# Patient Record
Sex: Male | Born: 1950 | Race: Black or African American | Hispanic: No | Marital: Married | Smoking: Former smoker
Health system: Southern US, Community
[De-identification: ages and names within clinical notes are randomized; demographics above are authoritative.]

## PROBLEM LIST (undated history)

## (undated) DIAGNOSIS — Z9889 Other specified postprocedural states: Secondary | ICD-10-CM

## (undated) DIAGNOSIS — M1990 Unspecified osteoarthritis, unspecified site: Secondary | ICD-10-CM

## (undated) DIAGNOSIS — R7303 Prediabetes: Secondary | ICD-10-CM

## (undated) DIAGNOSIS — M48061 Spinal stenosis, lumbar region without neurogenic claudication: Secondary | ICD-10-CM

## (undated) DIAGNOSIS — R112 Nausea with vomiting, unspecified: Secondary | ICD-10-CM

## (undated) DIAGNOSIS — G4733 Obstructive sleep apnea (adult) (pediatric): Secondary | ICD-10-CM

## (undated) DIAGNOSIS — Z87438 Personal history of other diseases of male genital organs: Secondary | ICD-10-CM

## (undated) DIAGNOSIS — N401 Enlarged prostate with lower urinary tract symptoms: Secondary | ICD-10-CM

## (undated) DIAGNOSIS — R200 Anesthesia of skin: Secondary | ICD-10-CM

## (undated) DIAGNOSIS — K219 Gastro-esophageal reflux disease without esophagitis: Secondary | ICD-10-CM

## (undated) DIAGNOSIS — K635 Polyp of colon: Secondary | ICD-10-CM

## (undated) DIAGNOSIS — R202 Paresthesia of skin: Secondary | ICD-10-CM

## (undated) DIAGNOSIS — G4730 Sleep apnea, unspecified: Secondary | ICD-10-CM

## (undated) DIAGNOSIS — E785 Hyperlipidemia, unspecified: Secondary | ICD-10-CM

## (undated) DIAGNOSIS — Z972 Presence of dental prosthetic device (complete) (partial): Secondary | ICD-10-CM

## (undated) DIAGNOSIS — R338 Other retention of urine: Secondary | ICD-10-CM

## (undated) DIAGNOSIS — F4310 Post-traumatic stress disorder, unspecified: Secondary | ICD-10-CM

## (undated) HISTORY — PX: POLYPECTOMY: SHX149

## (undated) HISTORY — DX: Hyperlipidemia, unspecified: E78.5

## (undated) HISTORY — DX: Polyp of colon: K63.5

## (undated) HISTORY — PX: COLONOSCOPY: SHX174

## (undated) HISTORY — DX: Spinal stenosis, lumbar region without neurogenic claudication: M48.061

## (undated) HISTORY — DX: Sleep apnea, unspecified: G47.30

## (undated) HISTORY — DX: Obstructive sleep apnea (adult) (pediatric): G47.33

## (undated) HISTORY — PX: COLONOSCOPY W/ POLYPECTOMY: SHX1380

## (undated) HISTORY — PX: CYST EXCISION: SHX5701

---

## 2004-06-28 ENCOUNTER — Ambulatory Visit: Payer: Self-pay | Admitting: Internal Medicine

## 2004-09-27 ENCOUNTER — Ambulatory Visit: Payer: Self-pay | Admitting: Internal Medicine

## 2006-07-01 ENCOUNTER — Ambulatory Visit: Payer: Self-pay | Admitting: Internal Medicine

## 2006-10-02 ENCOUNTER — Ambulatory Visit: Payer: Self-pay | Admitting: Internal Medicine

## 2006-10-02 LAB — CONVERTED CEMR LAB
ALT: 37 units/L (ref 0–40)
BUN: 14 mg/dL (ref 6–23)
CO2: 29 meq/L (ref 19–32)
Creatinine, Ser: 1.2 mg/dL (ref 0.4–1.5)
Direct LDL: 153 mg/dL
Eosinophils Relative: 3.8 % (ref 0.0–5.0)
GFR calc Af Amer: 80 mL/min
Glucose, Bld: 106 mg/dL — ABNORMAL HIGH (ref 70–99)
HCT: 48 % (ref 39.0–52.0)
Monocytes Relative: 11.6 % — ABNORMAL HIGH (ref 3.0–11.0)
PSA: 0.97 ng/mL (ref 0.10–4.00)
Platelets: 173 10*3/uL (ref 150–400)
RBC: 5.54 M/uL (ref 4.22–5.81)
RDW: 12.6 % (ref 11.5–14.6)
Sodium: 137 meq/L (ref 135–145)
Total Bilirubin: 1.3 mg/dL — ABNORMAL HIGH (ref 0.3–1.2)
Total CHOL/HDL Ratio: 5.4
Triglycerides: 103 mg/dL (ref 0–149)
VLDL: 21 mg/dL (ref 0–40)
WBC: 4.6 10*3/uL (ref 4.5–10.5)

## 2006-10-09 ENCOUNTER — Ambulatory Visit: Payer: Self-pay | Admitting: Internal Medicine

## 2007-03-06 ENCOUNTER — Ambulatory Visit: Payer: Self-pay | Admitting: Gastroenterology

## 2007-03-23 ENCOUNTER — Ambulatory Visit: Payer: Self-pay | Admitting: Gastroenterology

## 2007-03-23 ENCOUNTER — Encounter: Payer: Self-pay | Admitting: Gastroenterology

## 2007-03-23 ENCOUNTER — Encounter: Payer: Self-pay | Admitting: Internal Medicine

## 2007-03-24 ENCOUNTER — Telehealth: Payer: Self-pay | Admitting: Internal Medicine

## 2007-05-14 ENCOUNTER — Ambulatory Visit: Payer: Self-pay | Admitting: Internal Medicine

## 2007-05-14 DIAGNOSIS — E785 Hyperlipidemia, unspecified: Secondary | ICD-10-CM | POA: Insufficient documentation

## 2007-05-14 LAB — CONVERTED CEMR LAB
Albumin: 4.1 g/dL (ref 3.5–5.2)
Alkaline Phosphatase: 89 units/L (ref 39–117)
Bilirubin, Direct: 0.1 mg/dL (ref 0.0–0.3)
Cholesterol: 209 mg/dL (ref 0–200)
HDL: 39.6 mg/dL (ref >39.0)
Total CHOL/HDL Ratio: 5.3
VLDL: 22 mg/dL (ref 0–40)

## 2007-05-21 ENCOUNTER — Ambulatory Visit: Payer: Self-pay | Admitting: Internal Medicine

## 2007-05-21 DIAGNOSIS — L570 Actinic keratosis: Secondary | ICD-10-CM | POA: Insufficient documentation

## 2007-05-21 DIAGNOSIS — M48061 Spinal stenosis, lumbar region without neurogenic claudication: Secondary | ICD-10-CM | POA: Insufficient documentation

## 2007-05-21 DIAGNOSIS — Z860100 Personal history of colon polyps, unspecified: Secondary | ICD-10-CM | POA: Insufficient documentation

## 2007-05-21 DIAGNOSIS — L218 Other seborrheic dermatitis: Secondary | ICD-10-CM | POA: Insufficient documentation

## 2007-05-21 LAB — CONVERTED CEMR LAB: HDL goal, serum: 40 mg/dL

## 2007-05-29 ENCOUNTER — Encounter: Admission: RE | Admit: 2007-05-29 | Discharge: 2007-05-29 | Payer: Self-pay | Admitting: Internal Medicine

## 2007-06-02 ENCOUNTER — Encounter: Payer: Self-pay | Admitting: Internal Medicine

## 2007-06-02 ENCOUNTER — Telehealth: Payer: Self-pay | Admitting: *Deleted

## 2007-09-29 ENCOUNTER — Ambulatory Visit: Payer: Self-pay | Admitting: Internal Medicine

## 2008-02-18 ENCOUNTER — Encounter: Payer: Self-pay | Admitting: Internal Medicine

## 2008-04-26 ENCOUNTER — Encounter: Payer: Self-pay | Admitting: Internal Medicine

## 2008-11-15 ENCOUNTER — Encounter: Payer: Self-pay | Admitting: Internal Medicine

## 2008-12-15 ENCOUNTER — Ambulatory Visit: Payer: Self-pay | Admitting: Internal Medicine

## 2008-12-15 DIAGNOSIS — M25559 Pain in unspecified hip: Secondary | ICD-10-CM | POA: Insufficient documentation

## 2009-03-13 ENCOUNTER — Encounter: Payer: Self-pay | Admitting: Internal Medicine

## 2009-05-09 ENCOUNTER — Encounter: Payer: Self-pay | Admitting: Internal Medicine

## 2009-05-18 ENCOUNTER — Encounter: Payer: Self-pay | Admitting: Internal Medicine

## 2009-08-26 DIAGNOSIS — G4733 Obstructive sleep apnea (adult) (pediatric): Secondary | ICD-10-CM

## 2009-08-26 HISTORY — DX: Obstructive sleep apnea (adult) (pediatric): G47.33

## 2011-01-24 ENCOUNTER — Ambulatory Visit (INDEPENDENT_AMBULATORY_CARE_PROVIDER_SITE_OTHER): Payer: BC Managed Care – PPO | Admitting: Internal Medicine

## 2011-01-24 ENCOUNTER — Encounter: Payer: Self-pay | Admitting: Internal Medicine

## 2011-01-24 VITALS — BP 130/90 | HR 76 | Temp 98.2°F | Resp 14 | Ht 69.0 in | Wt 220.0 lb

## 2011-01-24 DIAGNOSIS — L570 Actinic keratosis: Secondary | ICD-10-CM

## 2011-01-24 DIAGNOSIS — E785 Hyperlipidemia, unspecified: Secondary | ICD-10-CM

## 2011-01-24 DIAGNOSIS — B356 Tinea cruris: Secondary | ICD-10-CM | POA: Insufficient documentation

## 2011-01-24 DIAGNOSIS — M48061 Spinal stenosis, lumbar region without neurogenic claudication: Secondary | ICD-10-CM

## 2011-01-24 MED ORDER — CLOTRIMAZOLE-BETAMETHASONE 1-0.05 % EX CREA: TOPICAL_CREAM | CUTANEOUS | Status: DC

## 2011-01-24 MED ORDER — FOLIC ACID 1 MG PO TABS: 1.0000 mg | ORAL_TABLET | Freq: Every day | ORAL | Status: DC

## 2011-01-24 MED ORDER — FLUCONAZOLE 100 MG PO TABS: 100.0000 mg | ORAL_TABLET | Freq: Every day | ORAL | Status: AC

## 2011-01-24 NOTE — Assessment & Plan Note (Signed)
We will treat this with both topical and oral to the chronic nature of this problem we will use Diflucan 100 mg by mouth daily for 14 days and clotrimazole to the site locally twice daily

## 2011-01-24 NOTE — Progress Notes (Signed)
  Subjective:    Patient ID: James [facility], male    DOB: 1951/08/16, 60 y.o.   MRN: [id number]  HPI Patient is 60 year old African American male who presents with a history of separate seborrheic dermatitis and a history of actinic keratosis who has 2 lesions on his scalp that he is concerned about this time.  He also has recurrent jock itch a hyperpigmentation of the region on his thighs and scrotum and severe itching this has been going on for several months.  He is a history of spinal stenosis diagnosed by a neurologist   Review of Systems  Constitutional: Negative for fever and fatigue.  HENT: Negative for hearing loss, congestion, neck pain and postnasal drip.   Eyes: Negative for discharge, redness and visual disturbance.  Respiratory: Negative for cough, shortness of breath and wheezing.   Cardiovascular: Negative for leg swelling.  Gastrointestinal: Negative for abdominal pain, constipation and abdominal distention.  Genitourinary: Negative for urgency and frequency.  Musculoskeletal: Negative for joint swelling and arthralgias.  Skin: Negative for color change and rash.  Neurological: Negative for weakness and light-headedness.  Hematological: Negative for adenopathy.  Psychiatric/Behavioral: Negative for behavioral problems.       Past Medical History  Diagnosis Date  . Hyperlipidemia   . Colon polyps   . Spinal stenosis of lumbar region   . Sleep apnea, obstructive    Past Surgical History  Procedure Date  . Colonoscopy w/ polypectomy     reports that he has quit smoking. He does not have any smokeless tobacco history on file. He reports that he drinks alcohol. He reports that he does not use illicit drugs. family history includes Cancer in his father; Lung cancer in his father; and Ulcers in an unspecified family member. No Known Allergies  Objective:   Physical Exam  Constitutional: He appears well-developed and well-nourished.  HENT:  Head: Normocephalic  and atraumatic.  Eyes: Conjunctivae are normal. Pupils are equal, round, and reactive to light.  Neck: Normal range of motion. Neck supple.  Cardiovascular: Normal rate and regular rhythm.   Pulmonary/Chest: Effort normal and breath sounds normal.  Abdominal: Soft. Bowel sounds are normal.   Actinic keratoses on scalp 2 lesions detected Hyperpigmented lesion in the groin involving the thighs bilaterally and the scrotum       Assessment & Plan:  Severe tenia crus and will treat with Diflucan orally and clotrimazole topically Actinic keratoses 2.3 cm lesions were detected on the scalp with ulcerated bases  Informed consent was obtained and each lesion was treated for 60 seconds of liquid nitrogen application the patient tolerated the procedure well as procedural care was discussed with the patient and instructions should the lesion reappears contact our office immediately Discussed health maintenance issues he was scheduled for physical he is due a tetanus she defers to his

## 2011-03-19 ENCOUNTER — Other Ambulatory Visit (INDEPENDENT_AMBULATORY_CARE_PROVIDER_SITE_OTHER): Payer: BC Managed Care – PPO

## 2011-03-19 DIAGNOSIS — Z0000 Encounter for general adult medical examination without abnormal findings: Secondary | ICD-10-CM

## 2011-03-19 LAB — CBC WITH DIFFERENTIAL/PLATELET
Basophils Absolute: 0 10*3/uL (ref 0.0–0.1)
Eosinophils Absolute: 0.3 10*3/uL (ref 0.0–0.7)
Eosinophils Relative: 6.2 % — ABNORMAL HIGH (ref 0.0–5.0)
HCT: 49.6 % (ref 39.0–52.0)
Hemoglobin: 16.5 g/dL (ref 13.0–17.0)
Monocytes Absolute: 0.6 10*3/uL (ref 0.1–1.0)
Monocytes Relative: 10.7 % (ref 3.0–12.0)
Neutrophils Relative %: 47.6 % (ref 43.0–77.0)
RBC: 5.66 Mil/uL (ref 4.22–5.81)
WBC: 5.3 10*3/uL (ref 4.5–10.5)

## 2011-03-19 LAB — POCT URINALYSIS DIPSTICK
Bilirubin, UA: NEGATIVE
pH, UA: 6

## 2011-03-19 LAB — HEPATIC FUNCTION PANEL
Albumin: 4.4 g/dL (ref 3.5–5.2)
Alkaline Phosphatase: 75 U/L (ref 39–117)
Total Bilirubin: 1.1 mg/dL (ref 0.3–1.2)

## 2011-03-19 LAB — LIPID PANEL
Cholesterol: 235 mg/dL — ABNORMAL HIGH (ref 0–200)
Total CHOL/HDL Ratio: 5
Triglycerides: 122 mg/dL (ref 0.0–149.0)
VLDL: 24.4 mg/dL (ref 0.0–40.0)

## 2011-03-19 LAB — BASIC METABOLIC PANEL
BUN: 18 mg/dL (ref 6–23)
Creatinine, Ser: 1.3 mg/dL (ref 0.4–1.5)
GFR: 70.81 mL/min (ref >60.00)
Glucose, Bld: 104 mg/dL — ABNORMAL HIGH (ref 70–99)

## 2011-03-19 LAB — LDL CHOLESTEROL, DIRECT: Direct LDL: 155.6 mg/dL

## 2011-03-27 ENCOUNTER — Ambulatory Visit (INDEPENDENT_AMBULATORY_CARE_PROVIDER_SITE_OTHER): Payer: BC Managed Care – PPO | Admitting: Internal Medicine

## 2011-03-27 ENCOUNTER — Encounter: Payer: Self-pay | Admitting: Internal Medicine

## 2011-03-27 VITALS — BP 130/84 | HR 84 | Temp 98.2°F | Resp 16 | Ht 69.0 in | Wt 226.0 lb

## 2011-03-27 DIAGNOSIS — M169 Osteoarthritis of hip, unspecified: Secondary | ICD-10-CM

## 2011-03-27 DIAGNOSIS — M25559 Pain in unspecified hip: Secondary | ICD-10-CM

## 2011-03-27 DIAGNOSIS — E785 Hyperlipidemia, unspecified: Secondary | ICD-10-CM

## 2011-03-27 DIAGNOSIS — Z0000 Encounter for general adult medical examination without abnormal findings: Secondary | ICD-10-CM

## 2011-03-27 MED ORDER — KRILL OIL 1000 MG PO CAPS: 1.0000 | ORAL_CAPSULE | Freq: Two times a day (BID) | ORAL | Status: DC

## 2011-03-27 MED ORDER — METHYLPREDNISOLONE ACETATE 40 MG/ML IJ SUSP: 40.0000 mg | Freq: Once | INTRAMUSCULAR | Status: DC

## 2011-03-27 NOTE — Progress Notes (Signed)
Subjective:    Patient ID: James Gates, male    DOB: February 14, 1951, 60 y.o.   MRN: [id number]  HPI Patient is a 60 year old [place] American male who presents for followup and a complete physical examination today.  He has elevated cholesterol is intolerant of statins had been on Zetia but did not like the way the Zetia may go as well so we started him on a low dose of Vicryl oil which he has tolerated we also talked about weight loss and exercise which is not achieved today he presents for screening physical labs with evidence of hyperlipidemia.  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  Otherwise he is doing well his past history is significant for spinal stenosis hyperlipidemia seborrheic dermatitis. Per   Review of Systems  Constitutional: Negative for fever and fatigue.  HENT: Negative for hearing loss, congestion, neck pain and postnasal drip.   Eyes: Negative for discharge, redness and visual disturbance.  Respiratory: Negative for cough, shortness of breath and wheezing.   Cardiovascular: Negative for leg swelling.  Gastrointestinal: Negative for abdominal pain, constipation and abdominal distention.  Genitourinary: Negative for urgency and frequency.  Musculoskeletal: Negative for joint swelling and arthralgias.  Skin: Negative for color change and rash.  Neurological: Negative for weakness and light-headedness.  Hematological: Negative for adenopathy.  Psychiatric/Behavioral: Negative for behavioral problems.   Past Medical History  Diagnosis Date  . Hyperlipidemia   . Colon polyps   . Spinal stenosis of lumbar region   . Sleep apnea, obstructive    Past Surgical History  Procedure Date  . Colonoscopy w/ polypectomy     reports that he has quit smoking. He does not have any smokeless  tobacco history on file. He reports that he drinks alcohol. He reports that he does not use illicit drugs. family history includes Cancer in his father; Lung cancer in his father; and Ulcers in an unspecified family member. No Known Allergies     Objective:   Physical Exam  Vitals reviewed. Constitutional: He appears well-developed and well-nourished.  HENT:  Head: Normocephalic and atraumatic.  Eyes: Conjunctivae are normal. Pupils are equal, round, and reactive to light.  Neck: Normal range of motion. Neck supple.  Cardiovascular: Normal rate and regular rhythm.   Pulmonary/Chest: Effort normal and breath sounds normal.  Abdominal: Soft. Bowel sounds are normal.  Genitourinary:       Moderately enlarged prostate  Musculoskeletal: He exhibits tenderness.  Neurological: He is alert.  Skin: Skin is warm and dry.          Assessment & Plan:   Patient presents for yearly preventative medicine examination.   all immunizations and health maintenance protocols were reviewed with the patient and they are up to date with these protocols.   screening laboratory values were reviewed with the patient including screening of hyperlipidemia PSA renal function and hepatic function.   There medications past medical history social history problem list and allergies were reviewed in detail.   Goals were established with regard to weight loss exercise diet in compliance with medications Patient is into joint disease of the hip and will receive a hip injection today he gave informed consent to site was prepped with Betadine a injection of 40 mg of Depo-Medrol and 1 cc of lidocaine 1% was injected into  the left hip patient tolerated the procedure well   His cholesterol is not at goal we discussed increasing the KRILL OIL TO twice a day; 10 pounds weight loss and we'll recheck his cholesterol in 3 months

## 2011-09-10 ENCOUNTER — Other Ambulatory Visit: Payer: BC Managed Care – PPO

## 2011-09-18 ENCOUNTER — Ambulatory Visit: Payer: BC Managed Care – PPO | Admitting: Internal Medicine

## 2012-04-06 ENCOUNTER — Encounter: Payer: Self-pay | Admitting: *Deleted

## 2012-04-08 ENCOUNTER — Encounter: Payer: Self-pay | Admitting: Gastroenterology

## 2012-05-19 ENCOUNTER — Other Ambulatory Visit (INDEPENDENT_AMBULATORY_CARE_PROVIDER_SITE_OTHER): Payer: BC Managed Care – PPO

## 2012-05-19 DIAGNOSIS — E785 Hyperlipidemia, unspecified: Secondary | ICD-10-CM

## 2012-05-19 DIAGNOSIS — Z0000 Encounter for general adult medical examination without abnormal findings: Secondary | ICD-10-CM

## 2012-05-19 LAB — CBC WITH DIFFERENTIAL/PLATELET
Basophils Relative: 0.9 % (ref 0.0–3.0)
Eosinophils Absolute: 0.2 10*3/uL (ref 0.0–0.7)
Eosinophils Relative: 5.4 % — ABNORMAL HIGH (ref 0.0–5.0)
HCT: 49.4 % (ref 39.0–52.0)
Lymphocytes Relative: 29.9 % (ref 12.0–46.0)
Lymphs Abs: 1.3 10*3/uL (ref 0.7–4.0)
Monocytes Absolute: 0.5 10*3/uL (ref 0.1–1.0)
RBC: 5.63 Mil/uL (ref 4.22–5.81)

## 2012-05-19 LAB — POCT URINALYSIS DIPSTICK
Blood, UA: NEGATIVE
Ketones, UA: NEGATIVE
Leukocytes, UA: NEGATIVE
Nitrite, UA: NEGATIVE
Urobilinogen, UA: 0.2
pH, UA: 5.5

## 2012-05-19 LAB — LIPID PANEL
Triglycerides: 92 mg/dL (ref 0.0–149.0)
VLDL: 18.4 mg/dL (ref 0.0–40.0)

## 2012-05-19 LAB — BASIC METABOLIC PANEL
CO2: 27 mEq/L (ref 19–32)
Calcium: 9.6 mg/dL (ref 8.4–10.5)
Chloride: 100 mEq/L (ref 96–112)
Creatinine, Ser: 1.3 mg/dL (ref 0.4–1.5)
GFR: 70.54 mL/min (ref >60.00)
Glucose, Bld: 100 mg/dL — ABNORMAL HIGH (ref 70–99)

## 2012-05-19 LAB — MICROALBUMIN / CREATININE URINE RATIO
Creatinine,U: 165.8 mg/dL
Microalb, Ur: 0.8 mg/dL (ref 0.0–1.9)

## 2012-05-19 LAB — HEPATIC FUNCTION PANEL
Albumin: 4.3 g/dL (ref 3.5–5.2)
Alkaline Phosphatase: 101 U/L (ref 39–117)

## 2012-05-20 LAB — LDL CHOLESTEROL, DIRECT: Direct LDL: 155.3 mg/dL

## 2012-05-26 ENCOUNTER — Encounter: Payer: Self-pay | Admitting: Internal Medicine

## 2012-05-26 ENCOUNTER — Ambulatory Visit (INDEPENDENT_AMBULATORY_CARE_PROVIDER_SITE_OTHER): Payer: BC Managed Care – PPO | Admitting: Internal Medicine

## 2012-05-26 VITALS — BP 132/84 | HR 72 | Temp 98.3°F | Ht 69.0 in | Wt 213.0 lb

## 2012-05-26 DIAGNOSIS — Z0000 Encounter for general adult medical examination without abnormal findings: Secondary | ICD-10-CM

## 2012-05-26 DIAGNOSIS — Z23 Encounter for immunization: Secondary | ICD-10-CM

## 2012-05-26 DIAGNOSIS — E785 Hyperlipidemia, unspecified: Secondary | ICD-10-CM

## 2012-05-26 MED ORDER — RED YEAST RICE 600 MG PO TABS: 1.0000 | ORAL_TABLET | Freq: Two times a day (BID) | ORAL | Status: DC

## 2012-05-26 MED ORDER — FOLIC ACID 1 MG PO TABS: 1.0000 mg | ORAL_TABLET | Freq: Every day | ORAL | Status: DC

## 2012-05-26 NOTE — Progress Notes (Signed)
Subjective:    Patient ID: James [facility], male    DOB: 08/02/1951, 61 y.o.   MRN: [id number]  HPI  CPX Pt had increased Hip pain and was evaluated by orthopedics Has been focusing on weight loss and exercise. Needs TD   Review of Systems  Constitutional: Negative for fever and fatigue.  HENT: Negative for hearing loss, congestion, neck pain and postnasal drip.   Eyes: Negative for discharge, redness and visual disturbance.  Respiratory: Negative for cough, shortness of breath and wheezing.   Cardiovascular: Negative for leg swelling.  Gastrointestinal: Negative for abdominal pain, constipation and abdominal distention.  Genitourinary: Negative for urgency and frequency.  Musculoskeletal: Positive for myalgias, back pain, joint swelling and gait problem. Negative for arthralgias.  Skin: Negative for color change and rash.  Neurological: Negative for weakness and light-headedness.  Hematological: Negative for adenopathy.  Psychiatric/Behavioral: Negative for behavioral problems.   Past Medical History  Diagnosis Date  . Hyperlipidemia   . Colon polyps   . Spinal stenosis of lumbar region   . Sleep apnea, obstructive     History   Social History  . Marital Status: Married    Spouse Name: N/A    Number of Children: N/A  . Years of Education: N/A   Occupational History  . Not on file.   Social History Main Topics  . Smoking status: Former Games developer  . Smokeless tobacco: Not on file  . Alcohol Use: Yes  . Drug Use: No  . Sexually Active: Yes   Other Topics Concern  . Not on file   Social History Narrative  . No narrative on file    Past Surgical History  Procedure Date  . Colonoscopy w/ polypectomy     Family History  Problem Relation Age of Onset  . Ulcers    . Lung cancer Father     No Known Allergies  Current Outpatient Prescriptions on File Prior to Visit  Medication Sig Dispense Refill  . aspirin 81 MG tablet Take 81 mg by mouth daily.        Marland Kitchen  KRILL OIL 1000 MG CAPS Take 1 capsule (1,000 mg total) by mouth 2 (two) times daily.      Marland Kitchen DISCONTD: folic acid (FOLVITE) 1 MG tablet Take 1 tablet (1 mg total) by mouth daily.  30 tablet  3   Current Facility-Administered Medications on File Prior to Visit  Medication Dose Route Frequency Provider Last Rate Last Dose  . methylPREDNISolone acetate (DEPO-MEDROL) injection 40 mg  40 mg Intra-articular Once Stacie Glaze, MD        BP 132/84  Pulse 72  Temp 98.3 F (36.8 C) (Oral)  Ht 5\' 9"  (1.753 m)  Wt 213 lb (96.616 kg)  BMI 31.45 kg/m2       Objective:   Physical Exam  Nursing note and vitals reviewed. Constitutional: He is oriented to person, place, and time. He appears well-developed and well-nourished.  HENT:  Head: Normocephalic and atraumatic.  Eyes: Conjunctivae normal are normal. Pupils are equal, round, and reactive to light.  Neck: Normal range of motion. Neck supple.  Cardiovascular: Normal rate and regular rhythm.   Pulmonary/Chest: Effort normal and breath sounds normal.  Abdominal: Soft. Bowel sounds are normal.  Genitourinary: Rectum normal and prostate normal.  Musculoskeletal:       Right hip pain and limited ROM  Neurological: He is alert and oriented to person, place, and time.  Skin: Skin is warm and dry.  Psychiatric:  He has a normal mood and affect. His behavior is normal.          Assessment & Plan:  Refer to [facility] for consideration of partial hip replacement  Patient presents for yearly preventative medicine examination.   all immunizations and health maintenance protocols were reviewed with the patient and they are up to date with these protocols.   screening laboratory values were reviewed with the patient including screening of hyperlipidemia PSA renal function and hepatic function.   There medications past medical history social history problem list and allergies were reviewed in detail.   Goals were established with regard to weight  loss exercise diet in compliance with medications

## 2012-05-26 NOTE — Patient Instructions (Addendum)
Consider a gluten-free diet such as the "partical paleo" Add red rice yeast twice a day   We can refer you to [facility] to discuss partial hip replacement

## 2012-11-09 ENCOUNTER — Other Ambulatory Visit: Payer: BC Managed Care – PPO

## 2012-11-16 ENCOUNTER — Ambulatory Visit: Payer: BC Managed Care – PPO | Admitting: Internal Medicine

## 2012-12-03 ENCOUNTER — Encounter: Payer: Self-pay | Admitting: Gastroenterology

## 2013-10-05 ENCOUNTER — Other Ambulatory Visit: Payer: BC Managed Care – PPO

## 2013-10-11 ENCOUNTER — Other Ambulatory Visit (INDEPENDENT_AMBULATORY_CARE_PROVIDER_SITE_OTHER): Payer: BC Managed Care – PPO

## 2013-10-11 ENCOUNTER — Other Ambulatory Visit: Payer: BC Managed Care – PPO

## 2013-10-11 DIAGNOSIS — E785 Hyperlipidemia, unspecified: Secondary | ICD-10-CM

## 2013-10-11 DIAGNOSIS — Z0000 Encounter for general adult medical examination without abnormal findings: Secondary | ICD-10-CM

## 2013-10-11 LAB — HEPATIC FUNCTION PANEL
ALT: 32 U/L (ref 0–53)
AST: 23 U/L (ref 0–37)
Albumin: 4.5 g/dL (ref 3.5–5.2)
Alkaline Phosphatase: 85 U/L (ref 39–117)
BILIRUBIN DIRECT: 0.1 mg/dL (ref 0.0–0.3)
TOTAL PROTEIN: 7.3 g/dL (ref 6.0–8.3)
Total Bilirubin: 0.8 mg/dL (ref 0.3–1.2)

## 2013-10-11 LAB — BASIC METABOLIC PANEL
BUN: 16 mg/dL (ref 6–23)
CALCIUM: 9.1 mg/dL (ref 8.4–10.5)
CHLORIDE: 104 meq/L (ref 96–112)
CO2: 27 meq/L (ref 19–32)
CREATININE: 1.2 mg/dL (ref 0.4–1.5)
GFR: 76.9 mL/min (ref >60.00)
Glucose, Bld: 92 mg/dL (ref 70–99)
POTASSIUM: 4.7 meq/L (ref 3.5–5.1)
SODIUM: 139 meq/L (ref 135–145)

## 2013-10-11 LAB — POCT URINALYSIS DIPSTICK
Bilirubin, UA: NEGATIVE
GLUCOSE UA: NEGATIVE
NITRITE UA: NEGATIVE
Protein, UA: NEGATIVE
Spec Grav, UA: 1.015
Urobilinogen, UA: 0.2
pH, UA: 5.5

## 2013-10-11 LAB — CBC WITH DIFFERENTIAL/PLATELET
BASOS PCT: 0.6 % (ref 0.0–3.0)
Basophils Absolute: 0 10*3/uL (ref 0.0–0.1)
EOS ABS: 0.4 10*3/uL (ref 0.0–0.7)
Eosinophils Relative: 7.5 % — ABNORMAL HIGH (ref 0.0–5.0)
HCT: 49.8 % (ref 39.0–52.0)
Hemoglobin: 16 g/dL (ref 13.0–17.0)
LYMPHS ABS: 2.1 10*3/uL (ref 0.7–4.0)
LYMPHS PCT: 40.1 % (ref 12.0–46.0)
MCHC: 32.1 g/dL (ref 30.0–36.0)
MCV: 89.3 fl (ref 78.0–100.0)
MONO ABS: 0.4 10*3/uL (ref 0.1–1.0)
MONOS PCT: 8.4 % (ref 3.0–12.0)
Neutro Abs: 2.3 10*3/uL (ref 1.4–7.7)
Neutrophils Relative %: 43.4 % (ref 43.0–77.0)
PLATELETS: 150 10*3/uL (ref 150.0–400.0)
RBC: 5.58 Mil/uL (ref 4.22–5.81)
RDW: 13.7 % (ref 11.5–14.6)
WBC: 5.3 10*3/uL (ref 4.5–10.5)

## 2013-10-11 LAB — LDL CHOLESTEROL, DIRECT: Direct LDL: 157.6 mg/dL

## 2013-10-11 LAB — LIPID PANEL
Cholesterol: 221 mg/dL — ABNORMAL HIGH (ref 0–200)
HDL: 55.1 mg/dL (ref >39.00)
TRIGLYCERIDES: 59 mg/dL (ref 0.0–149.0)
Total CHOL/HDL Ratio: 4
VLDL: 11.8 mg/dL (ref 0.0–40.0)

## 2013-10-11 LAB — TSH: TSH: 0.37 u[IU]/mL (ref 0.35–5.50)

## 2013-10-11 LAB — PSA: PSA: 1.15 ng/mL (ref 0.10–4.00)

## 2013-10-12 ENCOUNTER — Other Ambulatory Visit: Payer: BC Managed Care – PPO

## 2013-10-18 ENCOUNTER — Encounter: Payer: Self-pay | Admitting: Internal Medicine

## 2013-10-18 ENCOUNTER — Ambulatory Visit (INDEPENDENT_AMBULATORY_CARE_PROVIDER_SITE_OTHER): Payer: BC Managed Care – PPO | Admitting: Internal Medicine

## 2013-10-18 VITALS — BP 130/84 | HR 76 | Temp 98.1°F | Resp 16 | Ht 69.0 in | Wt 216.0 lb

## 2013-10-18 DIAGNOSIS — L723 Sebaceous cyst: Secondary | ICD-10-CM

## 2013-10-18 DIAGNOSIS — L729 Follicular cyst of the skin and subcutaneous tissue, unspecified: Secondary | ICD-10-CM

## 2013-10-18 DIAGNOSIS — Z0000 Encounter for general adult medical examination without abnormal findings: Secondary | ICD-10-CM

## 2013-10-18 MED ORDER — MUPIROCIN CALCIUM 2 % EX CREA: 1.0000 "application " | TOPICAL_CREAM | Freq: Two times a day (BID) | CUTANEOUS | Status: DC

## 2013-10-18 NOTE — Addendum Note (Signed)
Addended by: Ricard Dillon on: 10/18/2013 04:30 PM   Modules accepted: Orders

## 2013-10-18 NOTE — Patient Instructions (Signed)
The patient is instructed to continue all medications as prescribed. Schedule followup with check out clerk upon leaving the clinic  

## 2013-10-18 NOTE — Progress Notes (Signed)
Subjective:    Patient ID: James Gates, male    DOB: [birth year], 63 y.o.   MRN: [id number]  HPI Patient is a 63 year old male who presents for his annual examination.  He has a chief complaint of pain in his right hip he states that this is been chronic it is alleviated with one or 2 Advil several times a week it is not debilitating and does not interfere with activities at this point.  He also has a spot on his scalp that he has scratched and caused hyperpigmentation due to excoriations chronically   Review of Systems  Constitutional: Negative for fever and fatigue.  HENT: Negative for congestion, hearing loss and postnasal drip.   Eyes: Negative for discharge, redness and visual disturbance.  Respiratory: Negative for cough, shortness of breath and wheezing.   Cardiovascular: Negative for leg swelling.  Gastrointestinal: Negative for abdominal pain, constipation and abdominal distention.  Genitourinary: Negative for urgency and frequency.  Musculoskeletal: Positive for gait problem, joint swelling and myalgias. Negative for arthralgias and neck pain.  Skin: Positive for color change and rash.  Neurological: Negative for weakness and light-headedness.  Hematological: Negative for adenopathy.  Psychiatric/Behavioral: Negative for behavioral problems.       Past Medical History  Diagnosis Date  . Hyperlipidemia   . Colon polyps   . Spinal stenosis of lumbar region   . Sleep apnea, obstructive     History   Social History  . Marital Status: Married    Spouse Name: N/A    Number of Children: N/A  . Years of Education: N/A   Occupational History  . Not on file.   Social History Main Topics  . Smoking status: Former Research scientist (life sciences)  . Smokeless tobacco: Not on file  . Alcohol Use: Yes  . Drug Use: No  . Sexual Activity: Yes   Other Topics Concern  . Not on file   Social History Narrative  . No narrative on file    Past Surgical History  Procedure Laterality Date    . Colonoscopy w/ polypectomy      Family History  Problem Relation Age of Onset  . Ulcers    . Lung cancer Father     No Known Allergies  Current Outpatient Prescriptions on File Prior to Visit  Medication Sig Dispense Refill  . aspirin 81 MG tablet Take 81 mg by mouth daily.        . Red Yeast Rice 600 MG TABS Take 1 tablet (600 mg total) by mouth 2 (two) times daily.       Current Facility-Administered Medications on File Prior to Visit  Medication Dose Route Frequency Provider Last Rate Last Dose  . methylPREDNISolone acetate (DEPO-MEDROL) injection 40 mg  40 mg Intra-articular Once Ricard Dillon, MD        BP 130/84  Pulse 76  Temp(Src) 98.1 F (36.7 C)  Resp 16  Ht 5\' 9"  (1.753 m)  Wt 216 lb (97.977 kg)  BMI 31.88 kg/m2    Objective:   Physical Exam  Constitutional: He is oriented to person, place, and time. He appears well-developed and well-nourished.  HENT:  Head: Normocephalic and atraumatic.  Eyes: Conjunctivae are normal. Pupils are equal, round, and reactive to light.  Neck: Normal range of motion. Neck supple.  Cardiovascular: Normal rate and regular rhythm.   Murmur heard. Pulmonary/Chest: Effort normal and breath sounds normal.  Abdominal: Soft. Bowel sounds are normal.  Musculoskeletal: Normal range of motion. He exhibits tenderness.  Neurological: He is alert and oriented to person, place, and time.  Skin: Skin is dry. Rash noted.          Assessment & Plan:   Patient presents for yearly preventative medicine examination.   all immunizations and health maintenance protocols were reviewed with the patient and they are up to date with these protocols.   screening laboratory values were reviewed with the patient including screening of hyperlipidemia PSA renal function and hepatic function.   There medications past medical history social history problem list and allergies were reviewed in detail.   Goals were established with regard to  weight loss exercise diet in compliance with medications   Informed consent was obtained in the lesion was treated for 60 seconds of liquid nitrogen application the patient tolerated the procedure well as procedural care was discussed with the patient and instructions should the lesion reappears contact our office immediately

## 2014-08-05 ENCOUNTER — Telehealth: Payer: Self-pay | Admitting: *Deleted

## 2014-08-05 NOTE — Telephone Encounter (Signed)
Sent referral to [facility] to manage patient's CPAP machine and supplies. Wife notified referral done. Once patient's insurance benefits has been verified,  [facility] should be contacting him for service. Wife voiced her understanding of this and will notify the patient.

## 2014-10-17 ENCOUNTER — Other Ambulatory Visit: Payer: BC Managed Care – PPO

## 2014-10-26 ENCOUNTER — Encounter: Payer: BC Managed Care – PPO | Admitting: Internal Medicine

## 2014-11-03 ENCOUNTER — Telehealth: Payer: Self-pay | Admitting: Internal Medicine

## 2014-11-03 NOTE — Telephone Encounter (Signed)
Pt would like to transfer to you from Dr Arnoldo Morale.  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  Is this ok to schedule?

## 2014-11-03 NOTE — Telephone Encounter (Signed)
Ok with me 

## 2014-12-14 DIAGNOSIS — G4733 Obstructive sleep apnea (adult) (pediatric): Secondary | ICD-10-CM | POA: Insufficient documentation

## 2014-12-14 DIAGNOSIS — N400 Enlarged prostate without lower urinary tract symptoms: Secondary | ICD-10-CM | POA: Insufficient documentation

## 2015-01-05 ENCOUNTER — Ambulatory Visit: Payer: BC Managed Care – PPO | Admitting: Internal Medicine

## 2015-03-16 ENCOUNTER — Other Ambulatory Visit: Payer: Self-pay | Admitting: Orthopedic Surgery

## 2015-04-12 NOTE — Pre-Procedure Instructions (Signed)
James Gates  04/12/2015      [id number] - [place], [place] - [address] [place] [address] Phone: [phone] Fax: [phone]    Your procedure is scheduled on Mon, Aug 29 @ 10:10 AM  Report to [facility] Admitting at 8:00 AM  Call this number if you have problems the morning of surgery:  [phone]   Remember:  Do not eat food or drink liquids after midnight.                Stop taking your Naproxen and Aspirin. No Goody's,BC's,Ibuprofen,Fish Oil,or any Herbal Medications.f   Do not wear jewelry.  Do not wear lotions, powders, or perfumes.  You may wear deodorant.             Men may shave face and neck.  Do not bring valuables to the hospital.  [facility] is not responsible for any belongings or valuables.  Contacts, dentures or bridgework may not be worn into surgery.  Leave your suitcase in the car.  After surgery it may be brought to your room.  For patients admitted to the hospital, discharge time will be determined by your treatment team.  Patients discharged the day of surgery will not be allowed to drive home.   Special instructions:  [facility] - Preparing for Surgery  Before surgery, you can play an important role.  Because skin is not sterile, your skin needs to be as free of germs as possible.  You can reduce the number of germs on you skin by washing with CHG (chlorahexidine gluconate) soap before surgery.  CHG is an antiseptic cleaner which kills germs and bonds with the skin to continue killing germs even after washing.  Please DO NOT use if you have an allergy to CHG or antibacterial soaps.  If your skin becomes reddened/irritated stop using the CHG and inform your nurse when you arrive at Short Stay.  Do not shave (including legs and underarms) for at least 48 hours prior to the first CHG shower.  You may shave your face.  Please follow these instructions carefully:   1.  Shower with CHG Soap the  night before surgery and the                                morning of Surgery.  2.  If you choose to wash your hair, wash your hair first as usual with your       normal shampoo.  3.  After you shampoo, rinse your hair and body thoroughly to remove the                      Shampoo.  4.  Use CHG as you would any other liquid soap.  You can apply chg directly       to the skin and wash gently with scrungie or a clean washcloth.  5.  Apply the CHG Soap to your body ONLY FROM THE NECK DOWN.        Do not use on open wounds or open sores.  Avoid contact with your eyes,       ears, mouth and genitals (private parts).  Wash genitals (private parts)       with your normal soap.  6.  Wash thoroughly, paying special attention to the area where your surgery  will be performed.  7.  Thoroughly rinse your body with warm water from the neck down.  8.  DO NOT shower/wash with your normal soap after using and rinsing off       the CHG Soap.  9.  Pat yourself dry with a clean towel.            10.  Wear clean pajamas.            11.  Place clean sheets on your bed the night of your first shower and do not        sleep with pets.  Day of Surgery  Do not apply any lotions/deoderants the morning of surgery.  Please wear clean clothes to the hospital/surgery [facility].    Please read over the following fact sheets that you were given. Pain Booklet, Coughing and Deep Breathing, Blood Transfusion Information, MRSA Information and Surgical Site Infection Prevention

## 2015-04-13 ENCOUNTER — Encounter (HOSPITAL_COMMUNITY): Payer: Self-pay

## 2015-04-13 ENCOUNTER — Encounter (HOSPITAL_COMMUNITY)
Admission: RE | Admit: 2015-04-13 | Discharge: 2015-04-13 | Disposition: A | Discharge status: Home / Self Care | Payer: Medicare Other | Source: Ambulatory Visit | Attending: Orthopedic Surgery | Admitting: Orthopedic Surgery

## 2015-04-13 ENCOUNTER — Ambulatory Visit (HOSPITAL_COMMUNITY)
Admission: RE | Admit: 2015-04-13 | Discharge: 2015-04-13 | Disposition: A | Discharge status: Home / Self Care | Payer: Medicare Other | Source: Ambulatory Visit | Attending: Orthopedic Surgery | Admitting: Orthopedic Surgery

## 2015-04-13 DIAGNOSIS — M1611 Unilateral primary osteoarthritis, right hip: Secondary | ICD-10-CM | POA: Insufficient documentation

## 2015-04-13 DIAGNOSIS — Z01818 Encounter for other preprocedural examination: Secondary | ICD-10-CM | POA: Insufficient documentation

## 2015-04-13 DIAGNOSIS — Z01812 Encounter for preprocedural laboratory examination: Secondary | ICD-10-CM | POA: Diagnosis not present

## 2015-04-13 DIAGNOSIS — Z01810 Encounter for preprocedural cardiovascular examination: Secondary | ICD-10-CM | POA: Diagnosis not present

## 2015-04-13 DIAGNOSIS — Z0183 Encounter for blood typing: Secondary | ICD-10-CM | POA: Diagnosis not present

## 2015-04-13 DIAGNOSIS — F17210 Nicotine dependence, cigarettes, uncomplicated: Secondary | ICD-10-CM | POA: Diagnosis not present

## 2015-04-13 HISTORY — DX: Other retention of urine: R33.8

## 2015-04-13 HISTORY — DX: Personal history of other diseases of male genital organs: Z87.438

## 2015-04-13 HISTORY — DX: Benign prostatic hyperplasia with lower urinary tract symptoms: N40.1

## 2015-04-13 HISTORY — DX: Gastro-esophageal reflux disease without esophagitis: K21.9

## 2015-04-13 HISTORY — DX: Presence of dental prosthetic device (complete) (partial): Z97.2

## 2015-04-13 HISTORY — DX: Unspecified osteoarthritis, unspecified site: M19.90

## 2015-04-13 LAB — URINALYSIS, ROUTINE W REFLEX MICROSCOPIC
BILIRUBIN URINE: NEGATIVE
Glucose, UA: NEGATIVE mg/dL
Hgb urine dipstick: NEGATIVE
Ketones, ur: NEGATIVE mg/dL
Leukocytes, UA: NEGATIVE
NITRITE: NEGATIVE
PH: 6 (ref 5.0–8.0)
Protein, ur: NEGATIVE mg/dL
SPECIFIC GRAVITY, URINE: 1.015 (ref 1.005–1.030)
UROBILINOGEN UA: 0.2 mg/dL (ref 0.0–1.0)

## 2015-04-13 LAB — BASIC METABOLIC PANEL
Anion gap: 7 (ref 5–15)
BUN: 13 mg/dL (ref 6–20)
CO2: 26 mmol/L (ref 22–32)
CREATININE: 1.23 mg/dL (ref 0.61–1.24)
Calcium: 9.8 mg/dL (ref 8.9–10.3)
Chloride: 105 mmol/L (ref 101–111)
GFR calc Af Amer: >60 mL/min (ref >60)
GFR, EST NON AFRICAN AMERICAN: 60 mL/min — AB (ref >60)
GLUCOSE: 109 mg/dL — AB (ref 65–99)
Potassium: 4.5 mmol/L (ref 3.5–5.1)
SODIUM: 138 mmol/L (ref 135–145)

## 2015-04-13 LAB — ABO/RH: ABO/RH(D): O POS

## 2015-04-13 LAB — CBC WITH DIFFERENTIAL/PLATELET
BASOS ABS: 0 10*3/uL (ref 0.0–0.1)
Basophils Relative: 1 % (ref 0–1)
EOS ABS: 0.3 10*3/uL (ref 0.0–0.7)
EOS PCT: 6 % — AB (ref 0–5)
HCT: 48 % (ref 39.0–52.0)
Hemoglobin: 16.4 g/dL (ref 13.0–17.0)
LYMPHS PCT: 36 % (ref 12–46)
Lymphs Abs: 1.9 10*3/uL (ref 0.7–4.0)
MCH: 29.7 pg (ref 26.0–34.0)
MCHC: 34.2 g/dL (ref 30.0–36.0)
MCV: 86.8 fL (ref 78.0–100.0)
MONO ABS: 0.5 10*3/uL (ref 0.1–1.0)
Monocytes Relative: 9 % (ref 3–12)
Neutro Abs: 2.6 10*3/uL (ref 1.7–7.7)
Neutrophils Relative %: 48 % (ref 43–77)
PLATELETS: 162 10*3/uL (ref 150–400)
RBC: 5.53 MIL/uL (ref 4.22–5.81)
RDW: 13 % (ref 11.5–15.5)
WBC: 5.3 10*3/uL (ref 4.0–10.5)

## 2015-04-13 LAB — PROTIME-INR
INR: 1.14 (ref 0.00–1.49)
PROTHROMBIN TIME: 14.8 s (ref 11.6–15.2)

## 2015-04-13 LAB — TYPE AND SCREEN
ABO/RH(D): O POS
ANTIBODY SCREEN: NEGATIVE

## 2015-04-13 LAB — SURGICAL PCR SCREEN
MRSA, PCR: NEGATIVE
STAPHYLOCOCCUS AUREUS: NEGATIVE

## 2015-04-13 LAB — APTT: APTT: 30 s (ref 24–37)

## 2015-04-13 NOTE — Progress Notes (Signed)
   04/13/15 1115  OBSTRUCTIVE SLEEP APNEA  Have you ever been diagnosed with sleep apnea through a sleep study? Yes  If yes, do you have and use a CPAP or BPAP machine every night? 1  Do you know the presssure settings on your maching? No

## 2015-04-21 MED ORDER — CEFAZOLIN SODIUM-DEXTROSE 2-3 GM-% IV SOLR
2.0000 g | INTRAVENOUS | Status: AC
  Administered 2015-04-24: 2 g via INTRAVENOUS
  Filled 2015-04-21: qty 50

## 2015-04-21 MED ORDER — DEXTROSE-NACL 5-0.45 % IV SOLN: INTRAVENOUS | Status: DC

## 2015-04-21 MED ORDER — CHLORHEXIDINE GLUCONATE 4 % EX LIQD: 60.0000 mL | Freq: Once | CUTANEOUS | Status: DC

## 2015-04-21 NOTE — H&P (Signed)
TOTAL HIP ADMISSION H&P  Patient is admitted for right total hip arthroplasty.  Subjective:  Chief Complaint: right hip pain  HPI: James Gates, 64 y.o. male, has a history of pain and functional disability in the right hip(s) due to arthritis and patient has failed non-surgical conservative treatments for greater than 12 weeks to include NSAID's and/or analgesics, corticosteriod injections, weight reduction as appropriate and activity modification.  Onset of symptoms was gradual starting 1 years ago with gradually worsening course since that time.The patient noted no past surgery on the right hip(s).  Patient currently rates pain in the right hip at 10 out of 10 with activity. Patient has night pain, worsening of pain with activity and weight bearing, pain that interfers with activities of daily living and pain with passive range of motion. Patient has evidence of subchondral cysts and joint space narrowing by imaging studies. This condition presents safety issues increasing the risk of falls.  There is no current active infection.  Patient Active Problem List   Diagnosis Date Noted  . Tinea cruris 01/24/2011  . Pain in joint, pelvic region and thigh 12/15/2008  . DERMATITIS, SEBORRHEIC NEC 05/21/2007  . ACTINIC KERATOSIS, HEAD 05/21/2007  . SPINAL STENOSIS, LUMBAR 05/21/2007  . COLONIC POLYPS, HX OF 05/21/2007  . HYPERLIPIDEMIA 05/14/2007   Past Medical History  Diagnosis Date  . Hyperlipidemia   . Colon polyps   . Spinal stenosis of lumbar region   . Sleep apnea, obstructive 2011    uses CPAP nightly ; sleep study done @ [facility]  . GERD (gastroesophageal reflux disease)   . Arthritis   . History of BPH   . BPH (benign prostatic hypertrophy) with urinary retention   . Wears partial dentures     Past Surgical History  Procedure Laterality Date  . Colonoscopy w/ polypectomy    . No past surgeries      No prescriptions prior to admission   No Known Allergies  Social  History  Substance Use Topics  . Smoking status: Former Research scientist (life sciences)  . Smokeless tobacco: Never Used     Comment: quit smoking 1983  . Alcohol Use: Not on file    Family History  Problem Relation Age of Onset  . Ulcers    . Lung cancer Father      Review of Systems  Constitutional: Negative.   HENT: Negative.   Eyes: Negative.   Respiratory: Negative.   Cardiovascular: Negative.   Gastrointestinal: Negative.   Genitourinary:       Enlarged prostate  Musculoskeletal: Positive for joint pain.  Skin: Negative.   Neurological: Negative.   Endo/Heme/Allergies: Negative.   Psychiatric/Behavioral: Negative.     Objective:  Physical Exam  Constitutional: He is oriented to person, place, and time. He appears well-developed and well-nourished.  HENT:  Head: Normocephalic and atraumatic.  Eyes: Pupils are equal, round, and reactive to light.  Neck: Normal range of motion. Neck supple.  Cardiovascular: Intact distal pulses.   Respiratory: Effort normal.  Musculoskeletal:  the patient has moderate pain with range of motion in the right hip.  He does have obvious stiffness with limited internal and external rotation.  He has approximate 5-10 of internal rotation.  Pain with palpation of the groin.  He has minimal pain on the left side with both internal and external rotation and log roll.  He has brisk capillary refill and is neurovascularly intact distally.  Neurological: He is alert and oriented to person, place, and time.  Skin: Skin  is warm and dry.  Psychiatric: He has a normal mood and affect. His behavior is normal. Judgment and thought content normal.    Vital signs in last 24 hours:    Labs:   Estimated body mass index is 31.88 kg/(m^2) as calculated from the following:   Height as of 10/18/13: 5\' 9"  (1.753 m).   Weight as of 10/18/13: 97.977 kg (216 lb).   Imaging Review Plain radiographs demonstrate end-stage arthritis of the right hip with subchondral cyst  formation.  Assessment/Plan:  End stage arthritis, right hip(s)  The patient history, physical examination, clinical judgement of the provider and imaging studies are consistent with end stage degenerative joint disease of the right hip(s) and total hip arthroplasty is deemed medically necessary. The treatment options including medical management, injection therapy, arthroscopy and arthroplasty were discussed at length. The risks and benefits of total hip arthroplasty were presented and reviewed. The risks due to aseptic loosening, infection, stiffness, dislocation/subluxation,  thromboembolic complications and other imponderables were discussed.  The patient acknowledged the explanation, agreed to proceed with the plan and consent was signed. Patient is being admitted for inpatient treatment for surgery, pain control, PT, OT, prophylactic antibiotics, VTE prophylaxis, progressive ambulation and ADL's and discharge planning.The patient is planning to be discharged home with home health services

## 2015-04-21 NOTE — Progress Notes (Signed)
Patient called with time change. To arrive at 915.

## 2015-04-23 DIAGNOSIS — M1611 Unilateral primary osteoarthritis, right hip: Secondary | ICD-10-CM | POA: Diagnosis present

## 2015-04-23 MED ORDER — TRANEXAMIC ACID 1000 MG/10ML IV SOLN
1000.0000 mg | INTRAVENOUS | Status: AC
  Administered 2015-04-24: 1000 mg via INTRAVENOUS
  Filled 2015-04-23 (×3): qty 10

## 2015-04-23 MED ORDER — TRANEXAMIC ACID 1000 MG/10ML IV SOLN
2000.0000 mg | Freq: Once | INTRAVENOUS | Status: AC
  Administered 2015-04-24: 2000 mg via TOPICAL
  Filled 2015-04-23 (×2): qty 20

## 2015-04-24 ENCOUNTER — Encounter (HOSPITAL_COMMUNITY)
Admission: RE | Disposition: A | Discharge status: Skilled Nursing Facility | Payer: Self-pay | Source: Ambulatory Visit | Attending: Orthopedic Surgery

## 2015-04-24 ENCOUNTER — Inpatient Hospital Stay (HOSPITAL_COMMUNITY): Payer: Medicare Other | Admitting: Anesthesiology

## 2015-04-24 ENCOUNTER — Inpatient Hospital Stay (HOSPITAL_COMMUNITY)
Admission: RE | Admit: 2015-04-24 | Discharge: 2015-04-26 | DRG: 470 | Disposition: A | Discharge status: Skilled Nursing Facility | Payer: Medicare Other | Source: Ambulatory Visit | Attending: Orthopedic Surgery | Admitting: Orthopedic Surgery

## 2015-04-24 ENCOUNTER — Inpatient Hospital Stay (HOSPITAL_COMMUNITY): Payer: Medicare Other

## 2015-04-24 ENCOUNTER — Encounter (HOSPITAL_COMMUNITY): Payer: Self-pay | Admitting: *Deleted

## 2015-04-24 DIAGNOSIS — K219 Gastro-esophageal reflux disease without esophagitis: Secondary | ICD-10-CM | POA: Diagnosis present

## 2015-04-24 DIAGNOSIS — G4733 Obstructive sleep apnea (adult) (pediatric): Secondary | ICD-10-CM | POA: Diagnosis present

## 2015-04-24 DIAGNOSIS — N400 Enlarged prostate without lower urinary tract symptoms: Secondary | ICD-10-CM | POA: Diagnosis present

## 2015-04-24 DIAGNOSIS — D62 Acute posthemorrhagic anemia: Secondary | ICD-10-CM | POA: Diagnosis not present

## 2015-04-24 DIAGNOSIS — Z96649 Presence of unspecified artificial hip joint: Secondary | ICD-10-CM

## 2015-04-24 DIAGNOSIS — Z6831 Body mass index (BMI) 31.0-31.9, adult: Secondary | ICD-10-CM

## 2015-04-24 DIAGNOSIS — M25551 Pain in right hip: Secondary | ICD-10-CM | POA: Diagnosis present

## 2015-04-24 DIAGNOSIS — Z87891 Personal history of nicotine dependence: Secondary | ICD-10-CM | POA: Diagnosis not present

## 2015-04-24 DIAGNOSIS — Z419 Encounter for procedure for purposes other than remedying health state, unspecified: Secondary | ICD-10-CM

## 2015-04-24 DIAGNOSIS — E785 Hyperlipidemia, unspecified: Secondary | ICD-10-CM | POA: Diagnosis present

## 2015-04-24 DIAGNOSIS — M1611 Unilateral primary osteoarthritis, right hip: Principal | ICD-10-CM | POA: Diagnosis present

## 2015-04-24 DIAGNOSIS — M4806 Spinal stenosis, lumbar region: Secondary | ICD-10-CM | POA: Diagnosis present

## 2015-04-24 HISTORY — PX: TOTAL HIP ARTHROPLASTY: SHX124

## 2015-04-24 SURGERY — ARTHROPLASTY, HIP, TOTAL, ANTERIOR APPROACH
Anesthesia: General | Site: Hip | Laterality: Right

## 2015-04-24 MED ORDER — MENTHOL 3 MG MT LOZG: 1.0000 | LOZENGE | OROMUCOSAL | Status: DC | PRN

## 2015-04-24 MED ORDER — ZOLPIDEM TARTRATE 5 MG PO TABS: 5.0000 mg | ORAL_TABLET | Freq: Every evening | ORAL | Status: DC | PRN

## 2015-04-24 MED ORDER — EPHEDRINE SULFATE 50 MG/ML IJ SOLN
INTRAMUSCULAR | Status: AC
  Filled 2015-04-24: qty 1

## 2015-04-24 MED ORDER — DIPHENHYDRAMINE HCL 12.5 MG/5ML PO ELIX: 12.5000 mg | ORAL_SOLUTION | ORAL | Status: DC | PRN

## 2015-04-24 MED ORDER — METHOCARBAMOL 1000 MG/10ML IJ SOLN
500.0000 mg | Freq: Four times a day (QID) | INTRAVENOUS | Status: DC | PRN
  Filled 2015-04-24: qty 5

## 2015-04-24 MED ORDER — METHOCARBAMOL 500 MG PO TABS
500.0000 mg | ORAL_TABLET | Freq: Four times a day (QID) | ORAL | Status: DC | PRN
  Administered 2015-04-24 – 2015-04-26 (×3): 500 mg via ORAL
  Filled 2015-04-24 (×4): qty 1

## 2015-04-24 MED ORDER — SUCCINYLCHOLINE CHLORIDE 20 MG/ML IJ SOLN
INTRAMUSCULAR | Status: AC
  Filled 2015-04-24: qty 1

## 2015-04-24 MED ORDER — GLYCOPYRROLATE 0.2 MG/ML IJ SOLN
INTRAMUSCULAR | Status: AC
  Filled 2015-04-24: qty 2

## 2015-04-24 MED ORDER — PHENOL 1.4 % MT LIQD: 1.0000 | OROMUCOSAL | Status: DC | PRN

## 2015-04-24 MED ORDER — MIDAZOLAM HCL 2 MG/2ML IJ SOLN
INTRAMUSCULAR | Status: AC
  Filled 2015-04-24: qty 4

## 2015-04-24 MED ORDER — MIDAZOLAM HCL 5 MG/5ML IJ SOLN
INTRAMUSCULAR | Status: DC | PRN
  Administered 2015-04-24: 2 mg via INTRAVENOUS

## 2015-04-24 MED ORDER — OXYCODONE HCL 5 MG PO TABS
5.0000 mg | ORAL_TABLET | ORAL | Status: DC | PRN
  Administered 2015-04-24 – 2015-04-26 (×7): 10 mg via ORAL
  Filled 2015-04-24 (×8): qty 2

## 2015-04-24 MED ORDER — FENTANYL CITRATE (PF) 250 MCG/5ML IJ SOLN
INTRAMUSCULAR | Status: AC
  Filled 2015-04-24: qty 5

## 2015-04-24 MED ORDER — PHENYLEPHRINE HCL 10 MG/ML IJ SOLN
10.0000 mg | INTRAMUSCULAR | Status: DC | PRN
  Administered 2015-04-24: 25 ug/min via INTRAVENOUS

## 2015-04-24 MED ORDER — BUPIVACAINE HCL (PF) 0.5 % IJ SOLN
INTRAMUSCULAR | Status: DC | PRN
  Administered 2015-04-24: 17 mg via INTRATHECAL

## 2015-04-24 MED ORDER — PHENYLEPHRINE 40 MCG/ML (10ML) SYRINGE FOR IV PUSH (FOR BLOOD PRESSURE SUPPORT)
PREFILLED_SYRINGE | INTRAVENOUS | Status: AC
  Filled 2015-04-24: qty 10

## 2015-04-24 MED ORDER — BISACODYL 5 MG PO TBEC: 5.0000 mg | DELAYED_RELEASE_TABLET | Freq: Every day | ORAL | Status: DC | PRN

## 2015-04-24 MED ORDER — DEXAMETHASONE SODIUM PHOSPHATE 10 MG/ML IJ SOLN
10.0000 mg | Freq: Once | INTRAMUSCULAR | Status: AC
  Administered 2015-04-25: 10 mg via INTRAVENOUS
  Filled 2015-04-24: qty 1

## 2015-04-24 MED ORDER — FENTANYL CITRATE (PF) 250 MCG/5ML IJ SOLN
INTRAMUSCULAR | Status: DC | PRN
  Administered 2015-04-24 (×2): 50 ug via INTRAVENOUS

## 2015-04-24 MED ORDER — LIDOCAINE HCL (CARDIAC) 20 MG/ML IV SOLN
INTRAVENOUS | Status: AC
  Filled 2015-04-24: qty 10

## 2015-04-24 MED ORDER — ALUM & MAG HYDROXIDE-SIMETH 200-200-20 MG/5ML PO SUSP: 30.0000 mL | ORAL | Status: DC | PRN

## 2015-04-24 MED ORDER — ONDANSETRON HCL 4 MG PO TABS: 4.0000 mg | ORAL_TABLET | Freq: Four times a day (QID) | ORAL | Status: DC | PRN

## 2015-04-24 MED ORDER — KCL IN DEXTROSE-NACL 20-5-0.45 MEQ/L-%-% IV SOLN
INTRAVENOUS | Status: DC
  Administered 2015-04-24 (×2): via INTRAVENOUS
  Filled 2015-04-24 (×2): qty 1000

## 2015-04-24 MED ORDER — DEXAMETHASONE SODIUM PHOSPHATE 10 MG/ML IJ SOLN
INTRAMUSCULAR | Status: AC
  Filled 2015-04-24: qty 3

## 2015-04-24 MED ORDER — PROPOFOL INFUSION 10 MG/ML OPTIME
INTRAVENOUS | Status: DC | PRN
  Administered 2015-04-24: 50 ug/kg/min via INTRAVENOUS

## 2015-04-24 MED ORDER — ASPIRIN EC 325 MG PO TBEC
325.0000 mg | DELAYED_RELEASE_TABLET | Freq: Every day | ORAL | Status: DC
Start: 2015-04-25 — End: 2015-04-26
  Administered 2015-04-25 – 2015-04-26 (×2): 325 mg via ORAL
  Filled 2015-04-24 (×2): qty 1

## 2015-04-24 MED ORDER — METOCLOPRAMIDE HCL 5 MG PO TABS: 5.0000 mg | ORAL_TABLET | Freq: Three times a day (TID) | ORAL | Status: DC | PRN

## 2015-04-24 MED ORDER — NEOSTIGMINE METHYLSULFATE 10 MG/10ML IV SOLN
INTRAVENOUS | Status: AC
  Filled 2015-04-24: qty 1

## 2015-04-24 MED ORDER — ACETAMINOPHEN 325 MG PO TABS: 650.0000 mg | ORAL_TABLET | Freq: Four times a day (QID) | ORAL | Status: DC | PRN

## 2015-04-24 MED ORDER — LIDOCAINE HCL (CARDIAC) 20 MG/ML IV SOLN
INTRAVENOUS | Status: AC
  Filled 2015-04-24: qty 5

## 2015-04-24 MED ORDER — SODIUM CHLORIDE 0.9 % IJ SOLN
INTRAMUSCULAR | Status: AC
  Filled 2015-04-24: qty 10

## 2015-04-24 MED ORDER — PROPOFOL 10 MG/ML IV BOLUS
INTRAVENOUS | Status: AC
  Filled 2015-04-24: qty 20

## 2015-04-24 MED ORDER — ACETAMINOPHEN 650 MG RE SUPP: 650.0000 mg | Freq: Four times a day (QID) | RECTAL | Status: DC | PRN

## 2015-04-24 MED ORDER — PROMETHAZINE HCL 25 MG/ML IJ SOLN
12.5000 mg | Freq: Four times a day (QID) | INTRAMUSCULAR | Status: DC | PRN
  Administered 2015-04-24: 12.5 mg via INTRAVENOUS
  Filled 2015-04-24: qty 1

## 2015-04-24 MED ORDER — HYDROMORPHONE HCL 1 MG/ML IJ SOLN
0.5000 mg | INTRAMUSCULAR | Status: DC | PRN
  Administered 2015-04-24 (×2): 1 mg via INTRAVENOUS
  Filled 2015-04-24 (×2): qty 1

## 2015-04-24 MED ORDER — SENNOSIDES-DOCUSATE SODIUM 8.6-50 MG PO TABS: 1.0000 | ORAL_TABLET | Freq: Every evening | ORAL | Status: DC | PRN

## 2015-04-24 MED ORDER — FLEET ENEMA 7-19 GM/118ML RE ENEM: 1.0000 | ENEMA | Freq: Once | RECTAL | Status: DC | PRN

## 2015-04-24 MED ORDER — SODIUM CHLORIDE 0.9 % IR SOLN
Status: DC | PRN
  Administered 2015-04-24: 1000 mL

## 2015-04-24 MED ORDER — LACTATED RINGERS IV SOLN
INTRAVENOUS | Status: DC
  Administered 2015-04-24 (×3): via INTRAVENOUS

## 2015-04-24 MED ORDER — DOCUSATE SODIUM 100 MG PO CAPS
100.0000 mg | ORAL_CAPSULE | Freq: Two times a day (BID) | ORAL | Status: DC
  Administered 2015-04-25 – 2015-04-26 (×3): 100 mg via ORAL
  Filled 2015-04-24 (×2): qty 1

## 2015-04-24 MED ORDER — ROCURONIUM BROMIDE 50 MG/5ML IV SOLN
INTRAVENOUS | Status: AC
  Filled 2015-04-24: qty 1

## 2015-04-24 MED ORDER — METOCLOPRAMIDE HCL 5 MG/ML IJ SOLN
5.0000 mg | Freq: Three times a day (TID) | INTRAMUSCULAR | Status: DC | PRN
  Administered 2015-04-24: 10 mg via INTRAVENOUS
  Filled 2015-04-24: qty 2

## 2015-04-24 MED ORDER — ONDANSETRON HCL 4 MG/2ML IJ SOLN
4.0000 mg | Freq: Four times a day (QID) | INTRAMUSCULAR | Status: DC | PRN
  Administered 2015-04-24 – 2015-04-25 (×2): 4 mg via INTRAVENOUS
  Filled 2015-04-24 (×2): qty 2

## 2015-04-24 MED ORDER — PHENYLEPHRINE HCL 10 MG/ML IJ SOLN
INTRAMUSCULAR | Status: DC | PRN
  Administered 2015-04-24 (×4): 80 ug via INTRAVENOUS

## 2015-04-24 SURGICAL SUPPLY — 57 items
ADH SKN CLS APL DERMABOND .7 (GAUZE/BANDAGES/DRESSINGS) ×1
BLADE SAW SGTL 18X1.27X75 (BLADE) ×2 IMPLANT
BLADE SURG ROTATE 9660 (MISCELLANEOUS) IMPLANT
CAPT HIP TOTAL 2 ×1 IMPLANT
CELLS DAT CNTRL 66122 CELL SVR (MISCELLANEOUS) ×1 IMPLANT
COVER BACK TABLE 24X17X13 BIG (DRAPES) IMPLANT
COVER SURGICAL LIGHT HANDLE (MISCELLANEOUS) ×2 IMPLANT
DERMABOND ADVANCED (GAUZE/BANDAGES/DRESSINGS) ×1
DERMABOND ADVANCED .7 DNX12 (GAUZE/BANDAGES/DRESSINGS) ×1 IMPLANT
DRAPE C-ARM 42X72 X-RAY (DRAPES) ×2 IMPLANT
DRAPE IMP U-DRAPE 54X76 (DRAPES) ×2 IMPLANT
DRAPE STERI IOBAN 125X83 (DRAPES) ×2 IMPLANT
DRAPE U-SHAPE 47X51 STRL (DRAPES) ×6 IMPLANT
DRSG AQUACEL AG ADV 3.5X10 (GAUZE/BANDAGES/DRESSINGS) ×2 IMPLANT
DRSG TEGADERM 4X4.75 (GAUZE/BANDAGES/DRESSINGS) ×2 IMPLANT
DURAPREP 26ML APPLICATOR (WOUND CARE) ×2 IMPLANT
ELECT BLADE TIP CTD 4 INCH (ELECTRODE) IMPLANT
ELECT REM PT RETURN 9FT ADLT (ELECTROSURGICAL) ×2
ELECTRODE REM PT RTRN 9FT ADLT (ELECTROSURGICAL) ×1 IMPLANT
EVACUATOR 1/8 PVC DRAIN (DRAIN) ×2 IMPLANT
FACESHIELD WRAPAROUND (MASK) ×2 IMPLANT
FACESHIELD WRAPAROUND OR TEAM (MASK) ×1 IMPLANT
GAUZE SPONGE 2X2 8PLY STRL LF (GAUZE/BANDAGES/DRESSINGS) ×1 IMPLANT
GLOVE BIO SURGEON STRL SZ7.5 (GLOVE) ×2 IMPLANT
GLOVE BIO SURGEON STRL SZ8.5 (GLOVE) ×2 IMPLANT
GLOVE BIOGEL PI IND STRL 9 (GLOVE) ×1 IMPLANT
GLOVE BIOGEL PI INDICATOR 9 (GLOVE) ×1
GOWN BRE IMP PREV XXLGXLNG (GOWN DISPOSABLE) ×2 IMPLANT
GOWN STRL REIN 3XL XLG LVL4 (GOWN DISPOSABLE) ×2 IMPLANT
GOWN STRL REUS W/ TWL LRG LVL3 (GOWN DISPOSABLE) ×2 IMPLANT
GOWN STRL REUS W/TWL LRG LVL3 (GOWN DISPOSABLE) ×4
KIT BASIN OR (CUSTOM PROCEDURE TRAY) ×2 IMPLANT
KIT ROOM TURNOVER OR (KITS) ×2 IMPLANT
MANIFOLD NEPTUNE II (INSTRUMENTS) ×2 IMPLANT
NS IRRIG 1000ML POUR BTL (IV SOLUTION) ×2 IMPLANT
PACK TOTAL JOINT (CUSTOM PROCEDURE TRAY) ×2 IMPLANT
PACK UNIVERSAL I (CUSTOM PROCEDURE TRAY) ×2 IMPLANT
PAD ARMBOARD 7.5X6 YLW CONV (MISCELLANEOUS) ×4 IMPLANT
RESTRAINT LIMB HOLDER UNIV (RESTRAINTS) ×2 IMPLANT
RETRACTOR WND ALEXIS 18 MED (MISCELLANEOUS) ×1 IMPLANT
RETRACTOR YANK SUCT EIGR SABER (INSTRUMENTS) ×1 IMPLANT
RTRCTR WOUND ALEXIS 18CM MED (MISCELLANEOUS) ×2
SPONGE GAUZE 2X2 STER 10/PKG (GAUZE/BANDAGES/DRESSINGS) ×1
SPONGE LAP 4X18 X RAY DECT (DISPOSABLE) IMPLANT
STAPLER VISISTAT 35W (STAPLE) ×2 IMPLANT
SUCTION FRAZIER TIP 10 FR DISP (SUCTIONS) ×2 IMPLANT
SUT VIC AB 1 CT1 27 (SUTURE) ×8
SUT VIC AB 1 CT1 27XBRD ANBCTR (SUTURE) ×4 IMPLANT
SUT VIC AB 2-0 CT1 27 (SUTURE) ×4
SUT VIC AB 2-0 CT1 TAPERPNT 27 (SUTURE) ×2 IMPLANT
SUT VIC AB 3-0 PS2 18 (SUTURE) ×2
SUT VIC AB 3-0 PS2 18XBRD (SUTURE) ×1 IMPLANT
SUT VLOC 180 0 24IN GS25 (SUTURE) ×2 IMPLANT
TOWEL OR 17X24 6PK STRL BLUE (TOWEL DISPOSABLE) ×2 IMPLANT
TOWEL OR 17X26 10 PK STRL BLUE (TOWEL DISPOSABLE) ×2 IMPLANT
TRAY FOLEY CATH 16FRSI W/METER (SET/KITS/TRAYS/PACK) IMPLANT
WATER STERILE IRR 1000ML POUR (IV SOLUTION) ×6 IMPLANT

## 2015-04-24 NOTE — Evaluation (Signed)
Physical Therapy Evaluation Patient Details Name: James Gates MRN: [id number] DOB: 1951/03/23 Today's Date: 04/24/2015   History of Present Illness  Pt is a 64 y/o M s/p Rt THA. Pt's PMH inlcudes dermatitis, lumbar spinal stenosis.  Clinical Impression  Pt is s/p Rt THA resulting in the deficits listed below (see PT Problem List). Mr. Karner demonstrated ability to ambulate in room 40 ft w/ min guard assist this session.  He is planning to d/c to SNF as he does not have any assist available during the day at home. Pt will benefit from skilled PT to increase their independence and safety with mobility to allow discharge to the venue listed below.     Follow Up Recommendations SNF    Equipment Recommendations  Other (comment) (TBD by next venue of care)    Recommendations for Other Services       Precautions / Restrictions Precautions Precautions: Anterior Hip;Fall Precaution Booklet Issued: Yes (comment) Precaution Comments: Not clear if ant hip precautions needed or not, no specific order Restrictions Weight Bearing Restrictions: Yes RLE Weight Bearing: Weight bearing as tolerated      Mobility  Bed Mobility Overal bed mobility: Modified Independent             General bed mobility comments: HOB slightly elevated and heavy use of bed rail w/ increased time.  Cues for sequencing and technique.  Transfers Overall transfer level: Needs assistance Equipment used: Rolling walker (2 wheeled) Transfers: Sit to/from Stand Sit to Stand: Min guard         General transfer comment: Cues for hand placement and technique.  Min guard for safety, pt w/ good stability upon standing.  Ambulation/Gait Ambulation/Gait assistance: Min guard Ambulation Distance (Feet): 40 Feet Assistive device: Rolling walker (2 wheeled) Gait Pattern/deviations: Step-to pattern;Decreased stride length;Decreased stance time - right;Decreased weight shift to right;Antalgic;Trunk flexed   Gait  velocity interpretation: Below normal speed for age/gender General Gait Details: Trunk flexed which he says is his baseline 2/2 back pain.  Cues to stand as upright as he can tolerate and to keep his hips within the walker.  Pt has tendency to roll it too far in front of him.    Stairs            Wheelchair Mobility    Modified Rankin (Stroke Patients Only)       Balance Overall balance assessment: Needs assistance Sitting-balance support: No upper extremity supported;Feet supported Sitting balance-Leahy Scale: Good     Standing balance support: Bilateral upper extremity supported;During functional activity Standing balance-Leahy Scale: Poor Standing balance comment: Relies on RW for support                             Pertinent Vitals/Pain Pain Assessment: 0-10 Pain Score: 8  Pain Location: Rt hip Pain Descriptors / Indicators: Aching Pain Intervention(s): Limited activity within patient's tolerance;Monitored during session;Repositioned    Home Living Family/patient expects to be discharged to:: Skilled nursing facility Living Arrangements: Spouse/significant other               Additional Comments: Pt lives at home w/ wife and wife's mother.  Wife works during the day and will not be available for assist.      Prior Function Level of Independence: Independent with assistive device(s)         Comments: PTA pt would use walking pole prn and would hold onto things around the house  Hand Dominance        Extremity/Trunk Assessment   Upper Extremity Assessment: Overall WFL for tasks assessed           Lower Extremity Assessment: RLE deficits/detail RLE Deficits / Details: weakness and limited ROM s/p Rt THA.  Pt reports he occassionally has numbness down his Rt leg 2/2 spinal stenosis however he is not experiencing it now.  Denies ever experiencing weakness 2/2 spinal stenosis.       Communication   Communication: No difficulties   Cognition Arousal/Alertness: Awake/alert Behavior During Therapy: WFL for tasks assessed/performed Overall Cognitive Status: Within Functional Limits for tasks assessed                      General Comments      Exercises Total Joint Exercises Ankle Circles/Pumps: AROM;Both;15 reps;Supine Gluteal Sets: AAROM;Both;15 reps;Supine      Assessment/Plan    PT Assessment Patient needs continued PT services  PT Diagnosis Difficulty walking;Abnormality of gait;Generalized weakness;Acute pain   PT Problem List Decreased strength;Decreased range of motion;Decreased activity tolerance;Decreased balance;Decreased mobility;Decreased coordination;Decreased knowledge of use of DME;Decreased safety awareness;Decreased knowledge of precautions;Decreased skin integrity;Pain  PT Treatment Interventions DME instruction;Gait training;Stair training;Functional mobility training;Therapeutic activities;Therapeutic exercise;Balance training;Neuromuscular re-education;Patient/family education;Modalities   PT Goals (Current goals can be found in the Care Plan section) Acute Rehab PT Goals Patient Stated Goal: to get stronger PT Goal Formulation: With patient/family Time For Goal Achievement: 05/01/15 Potential to Achieve Goals: Good    Frequency 7X/week   Barriers to discharge Decreased caregiver support No available assist at home during the day    Co-evaluation               End of Session Equipment Utilized During Treatment: Gait belt Activity Tolerance: Patient limited by pain Patient left: in chair;with call bell/phone within reach;with family/visitor present Nurse Communication: Mobility status;Precautions;Weight bearing status         Time: 8756-4332 PT Time Calculation (min) (ACUTE ONLY): 26 min   Charges:   PT Evaluation $Initial PT Evaluation Tier I: 1 Procedure PT Treatments $Gait Training: 8-22 mins   PT G Codes:       Joslyn Hy PT, DPT [phone] Pager:  [phone] 04/24/2015, 4:42 PM

## 2015-04-24 NOTE — Anesthesia Preprocedure Evaluation (Addendum)
Anesthesia Evaluation  Patient identified by MRN, date of birth, ID band Patient awake    Reviewed: Allergy & Precautions, NPO status , Patient's Chart, lab work & pertinent test results  History of Anesthesia Complications Negative for: history of anesthetic complications  Airway Mallampati: II  TM Distance: >3 FB Neck ROM: Limited    Dental  (+) Dental Advisory Given   Pulmonary sleep apnea and Continuous Positive Airway Pressure Ventilation , former smoker,  breath sounds clear to auscultation        Cardiovascular - anginanegative cardio ROS  Rhythm:Regular Rate:Normal     Neuro/Psych Spinal stenosis    GI/Hepatic Neg liver ROS, GERD-  Medicated and Controlled,  Endo/Other  Morbid obesity  Renal/GU negative Renal ROS     Musculoskeletal  (+) Arthritis -, Osteoarthritis,    Abdominal (+) + obese,   Peds  Hematology negative hematology ROS (+)   Anesthesia Other Findings   Reproductive/Obstetrics                           Anesthesia Physical Anesthesia Plan  ASA: II  Anesthesia Plan: Spinal   Post-op Pain Management:    Induction:   Airway Management Planned: Natural Airway and Simple Face Mask  Additional Equipment:   Intra-op Plan:   Post-operative Plan:   Informed Consent: I have reviewed the patients History and Physical, chart, labs and discussed the procedure including the risks, benefits and alternatives for the proposed anesthesia with the patient or authorized representative who has indicated his/her understanding and acceptance.   Dental advisory given  Plan Discussed with: CRNA and Surgeon  Anesthesia Plan Comments: (Plan routine monitors, SAB)        Anesthesia Quick Evaluation

## 2015-04-24 NOTE — Anesthesia Postprocedure Evaluation (Signed)
  Anesthesia Post-op Note  Patient: James Gates  Procedure(s) Performed: Procedure(s): TOTAL HIP ARTHROPLASTY ANTERIOR APPROACH (Right)  Patient Location: PACU  Anesthesia Type:Spinal  Level of Consciousness: awake and alert   Airway and Oxygen Therapy: Patient Spontanous Breathing  Post-op Pain: none  Post-op Assessment: Post-op Vital signs reviewed and Patient's Cardiovascular Status Stable LLE Motor Response: Purposeful movement LLE Sensation: Decreased RLE Motor Response: Purposeful movement RLE Sensation: Decreased L Sensory Level: L4-Anterior knee, lower leg R Sensory Level: L3-Anterior knee, lower leg  Post-op Vital Signs: Reviewed and stable  Last Vitals:  Filed Vitals:   04/24/15 1345  BP: 104/65  Pulse: 67  Temp: 36.5 C  Resp: 14    Complications: No apparent anesthesia complications

## 2015-04-24 NOTE — Interval H&P Note (Signed)
History and Physical Interval Note:  04/24/2015 8:01 AM  James Gates  has presented today for surgery, with the diagnosis of PRIMARY OSTEOARTHRITIS OF RIGHT HIP  The various methods of treatment have been discussed with the patient and family. After consideration of risks, benefits and other options for treatment, the patient has consented to  Procedure(s): TOTAL HIP ARTHROPLASTY ANTERIOR APPROACH (Right) as a surgical intervention .  The patient's history has been reviewed, patient examined, no change in status, stable for surgery.  I have reviewed the patient's chart and labs.  Questions were answered to the patient's satisfaction.     Kerin Salen

## 2015-04-24 NOTE — Anesthesia Procedure Notes (Signed)
Spinal  Start time: 04/24/2015 9:36 AM End time: 04/24/2015 9:47 AM Staffing Anesthesiologist: Annye Asa Performed by: anesthesiologist  Preanesthetic Checklist Completed: patient identified, site marked, surgical consent, pre-op evaluation, timeout performed, IV checked, risks and benefits discussed and monitors and equipment checked Spinal Block Patient position: sitting Prep: Betadine and ChloraPrep Patient monitoring: heart rate, cardiac monitor, continuous pulse ox and blood pressure Approach: midline Location: L2-3 Injection technique: single-shot Needle Needle type: Quincke  Needle gauge: 22 G Additional Notes Pt identified in Operating room.  Monitors applied. Working IV access confirmed. Sterile prep lumbar spine.  1% lido local.  #22ga SAB into clear CSF L2,3.   17mg  Bupivacaine injected with asp CSF beginning, end of injection. VSS, no heme aspirated, tolerated well.  Jenita Seashore, MD

## 2015-04-24 NOTE — Op Note (Signed)
OPERATIVE REPORT    DATE OF PROCEDURE:  04/24/2015       PREOPERATIVE DIAGNOSIS:  PRIMARY OSTEOARTHRITIS OF RIGHT HIP                                                          POSTOPERATIVE DIAGNOSIS:  PRIMARY OSTEOARTHRITIS OF RIGHT HIP                                                           PROCEDURE:  Anterior approach R total hip arthroplasty using a 54 mm DePuy Pinnacle  Cup, [employer], 0-degree polyethylene. #7 diffuse Tri-Lock stem   SURGEON: GBTDV,VOHYW Gates    ASSISTANT:   [employer]  (present throughout entire procedure and necessary for timely completion of the procedure)   ANESTHESIA: Spinal BLOOD LOSS: 600 FLUID REPLACEMENT: 1800 crystalloid Antibiotic: 2gm Ancef Tranexamic Acid: 1 gm iv, 2 gm Topical COMPLICATIONS: none    INDICATIONS FOR PROCEDURE: A 64 y.o. year-old With  PRIMARY OSTEOARTHRITIS OF RIGHT HIP   for 3 years, x-rays show bone-on-bone arthritic changes. Despite conservative measures with observation, anti-inflammatory medicine, narcotics, use of a cane, has severe unremitting pain and can ambulate only a few blocks before resting.  Patient desires elective R total hip arthroplasty to decrease pain and increase function. The risks, benefits, and alternatives were discussed at length including but not limited to the risks of infection, bleeding, nerve injury, stiffness, blood clots, the need for revision surgery, cardiopulmonary complications, among others, and they were willing to proceed. Questions answered     PROCEDURE IN DETAIL: The patient was identified by armband,  received preoperative IV antibiotics in the holding area at [facility], taken to the operating room , appropriate anesthetic monitors  were attached and spinal anesthesia was induced with the patient and the gurney. The Hohn of boots were applied to the feet and he was then transferred to the [facility] table with a peroneal post and support underneath the left leg  which was locked in extension. The right lower extremity was then prepped and draped in the usual sterile fashion from just above the iliac crest to the knee. And a timeout procedure was performed. We then made a 12 cm incision along the interval at the leading edge of the tensor fascia lot of starting at 2 cm lateral to and 2 cm distal to the ASIS. Small bleeders in the skin and subcutaneous tissue identified and cauterized we dissected down to the fascia and made an incision in the fascia allowing [place] to elevate the fascia of the tensor muscle and exploited the interval between the rectus and the tensor fascia lot of. A Hohmann retractor was then placed along the superior neck of the femur and a Cobra retractor along the inferior neck of the femur we teed the capsule starting out at the superior anterior aspect of the acetabulum going distally and made the T along the neck both leaflets of the T were tagged with #2 Ethibond suture. Cobra retractors were then placed along the inferior and superior neck allowing [place] to perform a standard neck  cut and removed the femoral head with a power corkscrew. We then placed a right angle Hohmann retractor along the anterior aspect of the acetabulum a spiked Cobra in the cotyloid notch and posteriorly another spiked Cobra. We then sequentially reamed up to a 53 mm basket reamer obtaining good coverage in all quadrants and this is verified by C-arm imaging. Under C-arm control with and hammered into place a 54 mm Pinnacle cup in 45 of abduction and 15 of anteversion. The cup seated nicely and required no supplemental screws. We then placed a central hole Eliminator and a 0 polyethylene liner to except a 36 mm head. At this point the femoral elevator was placed along the intertrochanteric flare the limb was then lowered and adductor and using the femoral elevator the proximal femur was delivered into the wound. A medium Hohmann retractor was placed over the greater trochanter  and a Mueller retractor along the posterior femoral neck completing the exposure. We then performed releases superiorly and and inferiorly of the capsule going back to the P Reform's fossa superiorly and to the lesser trochanter inferiorly. We then entered the proximal femur with the box cutting offset chisel followed by the chili pepper and broaching up to a #5 broach. This seated nicely and we reamed the calcar. A trial reduction was performed with a 1.5 mm 36 mm head and the stem appeared to be slightly undersized. We then removed the trials and broached up to a 7 broach obtaining excellent fit and fill and good stability. One more trial reduction was performed the limb lengths were excellent the hip was stable in 90 of external rotation. At this point the trial components removed and we hammered into place a #7 Tri-Lock stem with [employer] coating. This is a high offset stem and a +0 36 mm ceramic ball was then hammered into place the hip was reduced and final C-arm images obtained. The wound is thoroughly irrigated with normal saline solution we then packed the sponge with [employer] a casted into the wound and closed the fascia of the tensor fascia lot a with running the lock suture the subcutaneous tissue was closed with 20 and 30 Vicryls suture followed by an Aquasol dressing. At this point the patient was transferred to hospital gurney without difficulty. The subcutaneous  tissue with 0 and 2-0 undyed Vicryl suture and the skin with running  3-0 vicryl subcuticular suture. Aquacil dressing was applied. The patient was then unclamped, rolled supine, awaken extubated and taken to recovery room without difficulty in stable condition.   James Gates 04/24/2015, 12:14 PM

## 2015-04-24 NOTE — Transfer of Care (Signed)
Immediate Anesthesia Transfer of Care Note  Patient: James Gates  Procedure(s) Performed: Procedure(s): TOTAL HIP ARTHROPLASTY ANTERIOR APPROACH (Right)  Patient Location: PACU  Anesthesia Type:Spinal  Level of Consciousness: awake and alert   Airway & Oxygen Therapy: Patient Spontanous Breathing and Patient connected to nasal cannula oxygen  Post-op Assessment: Report given to RN and Post -op Vital signs reviewed and stable  Post vital signs: Reviewed and stable  Last Vitals:  Filed Vitals:   04/24/15 1249  BP: 102/67  Pulse:   Temp: 36.4 C  Resp:     Complications: No apparent anesthesia complications

## 2015-04-24 NOTE — Progress Notes (Signed)
Utilization review completed.  

## 2015-04-25 ENCOUNTER — Encounter (HOSPITAL_COMMUNITY): Payer: Self-pay | Admitting: Orthopedic Surgery

## 2015-04-25 LAB — CBC
HCT: 38.4 % — ABNORMAL LOW (ref 39.0–52.0)
Hemoglobin: 12.6 g/dL — ABNORMAL LOW (ref 13.0–17.0)
MCH: 29 pg (ref 26.0–34.0)
MCHC: 32.8 g/dL (ref 30.0–36.0)
MCV: 88.3 fL (ref 78.0–100.0)
PLATELETS: 136 10*3/uL — AB (ref 150–400)
RBC: 4.35 MIL/uL (ref 4.22–5.81)
RDW: 12.9 % (ref 11.5–15.5)
WBC: 9 10*3/uL (ref 4.0–10.5)

## 2015-04-25 NOTE — Progress Notes (Signed)
Patient ID: James Gates, male   DOB: 08-Apr-1951, 64 y.o.   MRN: [id number] PATIENT ID: James Gates  MRN: [id number]  DOB/AGE:  [birth year] / 64 y.o.  1 Day Post-Op Procedure(s) (LRB): TOTAL HIP ARTHROPLASTY ANTERIOR APPROACH (Right)    PROGRESS NOTE Subjective: Patient is alert, oriented, x1 Nausea, no Vomiting, yes passing gas, no Bowel Movement. Taking PO well. Denies SOB, Chest or Calf Pain. Using Incentive Spirometer, PAS in place. Ambulate 42ft Patient reports pain as 4 on 0-10 scale  .    Objective: Vital signs in last 24 hours: Filed Vitals:   04/24/15 1557 04/24/15 2026 04/25/15 0151 04/25/15 0540  BP:  142/77 130/64 127/70  Pulse:  80 96 105  Temp:  97.1 F (36.2 C) 99.7 F (37.6 C) 100.7 F (38.2 C)  TempSrc:  Oral Oral Oral  Resp:  18 18 18   Height: 5\' 9"  (1.753 m)     Weight: 97.523 kg (215 lb)     SpO2:  100% 98% 95%      Intake/Output from previous day: I/O last 3 completed shifts: In: 4160.4 [P.O.:200; I.V.:3960.4] Out: 750 [Urine:350; Blood:400]   Intake/Output this shift:     LABORATORY DATA:  Recent Labs  04/25/15 0430  WBC 9.0  HGB 12.6*  HCT 38.4*  PLT 136*    Examination: Neurologically intact ABD soft Neurovascular intact Sensation intact distally Intact pulses distally Dorsiflexion/Plantar flexion intact Incision: dressing C/D/I No cellulitis present Compartment soft} XR AP&Lat of hip shows well placed\fixed Tri loc THA  Assessment:   1 Day Post-Op Procedure(s) (LRB): TOTAL HIP ARTHROPLASTY ANTERIOR APPROACH (Right) ADDITIONAL DIAGNOSIS:  Expected Acute Blood Loss Anemia, Spinal Stenosis  Plan: PT/OT WBAT  DVT Prophylaxis: SCDx72 hrs, ASA 325 mg BID x 2 weeks  DISCHARGE PLAN: Skilled Nursing Facility/Rehab, Has reservation at [facility]  DISCHARGE NEEDS: HHPT, Walker and 3-in-1 comode seat

## 2015-04-25 NOTE — Care Management Note (Signed)
Case Management Note  Patient Details  Name: TIARA BARTOLI MRN: [id number] Date of Birth: [birth year]  Subjective/Objective:                 S/p right total hip arthroplasty   Action/Plan: PT and OT recommended SNF. Referral made to CSW. CSW working on SNF bed.   Expected Discharge Date:  04/27/15               Expected Discharge Plan:  Skilled Nursing Facility  In-House Referral:  Clinical Social Work  Discharge planning Services  CM Consult  Post Acute Care Choice:  NA Choice offered to:  NA  DME Arranged:    DME Agency:     HH Arranged:    [place] Agency:     Status of Service:  Completed, signed off  Medicare Important Message Given:    Date Medicare IM Given:    Medicare IM give by:    Date Additional Medicare IM Given:    Additional Medicare Important Message give by:     If discussed at [place] of Stay Meetings, dates discussed:    Additional Comments:  Nila Nephew, RN 04/25/2015, 12:01 PM

## 2015-04-25 NOTE — Progress Notes (Signed)
Physical Therapy Treatment Patient Details Name: James Gates MRN: [id number] DOB: 1951-04-12 Today's Date: 04/25/2015    History of Present Illness Pt is a 64 y/o M s/p Rt THA. Pt's PMH inlcudes dermatitis, lumbar spinal stenosis.    PT Comments    James Gates was quick to fatigue w/ ambulation, but demonstrated ability to ambulate 60 ft w/ min guard assist.  He requires cues to stand upright and for proper use of RW but was able to tolerate progression w/ his therapeutic exercises.  Pt will benefit from continued skilled PT services to increase functional independence and safety.   Follow Up Recommendations  SNF     Equipment Recommendations  Other (comment) (TBD by next venue of care)    Recommendations for Other Services       Precautions / Restrictions Precautions Precautions: Anterior Hip;Fall Precaution Booklet Issued: Yes (comment) Precaution Comments: Pt able to recall 2/3 hip precatuions, omitting hip abduction Restrictions Weight Bearing Restrictions: Yes RLE Weight Bearing: Weight bearing as tolerated    Mobility  Bed Mobility Overal bed mobility: Needs Assistance Bed Mobility: Supine to Sit     Supine to sit: Mod assist;HOB elevated     General bed mobility comments: HOB flat, pt used bed rail and 1 person hand held assist to achieve supine>sit.  Poor trunk strength.  Required increased time and cues for technique.  Transfers Overall transfer level: Needs assistance Equipment used: Rolling walker (2 wheeled) Transfers: Sit to/from Stand Sit to Stand: Min guard         General transfer comment: Min guard and pt is slow to rise.  Cues for hand placement and to place weight thorugh Bil LEs.    Ambulation/Gait Ambulation/Gait assistance: Min guard Ambulation Distance (Feet): 60 Feet Assistive device: Rolling walker (2 wheeled) Gait Pattern/deviations: Step-to pattern;Decreased stride length;Decreased weight shift to right;Decreased stance time -  right;Antalgic;Trunk flexed   Gait velocity interpretation: Below normal speed for age/gender General Gait Details: Trunk flexed which he says is his baseline 2/2 back pain.  Cues to stand as upright as he can tolerate and to keep his hips within the walker.  Pt has tendency to roll it too far in front of him.  Pt fatigues after ambulating 30 ft and requests to turn around and head back to room.  Fatigue mainly in Rt LE.   Stairs            Wheelchair Mobility    Modified Rankin (Stroke Patients Only)       Balance Overall balance assessment: Needs assistance Sitting-balance support: Feet supported;Bilateral upper extremity supported Sitting balance-Leahy Scale: Good     Standing balance support: Bilateral upper extremity supported;During functional activity Standing balance-Leahy Scale: Poor                      Cognition Arousal/Alertness: Awake/alert Behavior During Therapy: WFL for tasks assessed/performed Overall Cognitive Status: Within Functional Limits for tasks assessed                      Exercises Total Joint Exercises Ankle Circles/Pumps: AROM;Both;15 reps;Supine Heel Slides: AAROM;Right;10 reps;Seated Long Arc Quad: Strengthening;Right;10 reps;Seated    General Comments General comments (skin integrity, edema, etc.): ice applied      Pertinent Vitals/Pain Pain Assessment: 0-10 Pain Score: 8  Pain Location: Rt hip Pain Descriptors / Indicators: Constant;Discomfort;Grimacing;Aching Pain Intervention(s): Limited activity within patient's tolerance;Monitored during session;Repositioned    Home Living Family/patient expects to be discharged  to:: Skilled nursing facility                    Prior Function Level of Independence: Independent with assistive device(s)      Comments: PTA pt would use walking pole prn and would hold onto things around the house   PT Goals (current goals can now be found in the care plan section)  Acute Rehab PT Goals Patient Stated Goal: to get stronger PT Goal Formulation: With patient/family Time For Goal Achievement: 05/01/15 Potential to Achieve Goals: Good Progress towards PT goals: Progressing toward goals    Frequency  7X/week    PT Plan Current plan remains appropriate    Co-evaluation             End of Session Equipment Utilized During Treatment: Gait belt Activity Tolerance: Patient limited by pain;Patient limited by fatigue Patient left: in chair;with call bell/phone within reach     Time: 0937-1001 PT Time Calculation (min) (ACUTE ONLY): 24 min  Charges:  $Gait Training: 8-22 mins $Therapeutic Exercise: 8-22 mins                    G Codes:      Joslyn Hy PT, [place] [phone] Pager: [phone] 04/25/2015, 12:09 PM

## 2015-04-25 NOTE — Plan of Care (Signed)
Problem: Consults Goal: Diagnosis- Total Joint Replacement Outcome: Completed/Met Date Met:  04/25/15 Primary Total Hip right

## 2015-04-25 NOTE — Clinical Social Work Note (Signed)
Clinical Social Work Assessment  Patient Details  Name: James Gates MRN: [id number] Date of Birth: 19-Dec-1950  Date of referral:  04/25/15               Reason for consult:  Facility Placement, Discharge Planning                Permission sought to share information with:  Facility Sport and exercise psychologist, Family Supports Permission granted to share information::  Yes, Verbal Permission Granted  Name::     [name]  Agency::  [employer]  Relationship::  Wife  Contact Information:  [phone]  Housing/Transportation Living arrangements for the past 2 months:  Single Family Home Source of Information:  Patient Patient Interpreter Needed:  None Criminal Activity/Legal Involvement Pertinent to Current Situation/Hospitalization:  No - Comment as needed Significant Relationships:  Spouse Lives with:  Spouse, Other (Comment) (Mother-in-law lives with patient.) Do you feel safe going back to the place where you live?  No (High fall risk.) Need for family participation in patient care:  No (Coment) (Patient able to make own decisions.)  Care giving concerns:  Patient expressed no concerns at this time.   Social Worker assessment / plan:  CSW received referral for possible SNF placement at time of discharge. CSW met with patient to discuss discharge disposition. Patient stated patient's awareness to PT recommendation, and is agreeable to SNF placement. Per patient, patient has pre-[facility] with [facility] and anticipates to discharge to their facility at time of discharge. CSW to continue to follow and assist with discharge planning needs.  Employment status:  Retired Forensic scientist:  Programmer, applications (Marine scientist) PT Recommendations:  [facility] / Referral to community resources:  [place]  Patient/Family's Response to care:  Patient understanding and agreeable to CSW plan of care.  Patient/Family's  Understanding of and Emotional Response to Diagnosis, Current Treatment, and Prognosis:  Patient understanding and agreeable to CSW plan of care.  Emotional Assessment Appearance:  Appears younger than stated age Attitude/Demeanor/Rapport:  Other (Pleasant.) Affect (typically observed):  Accepting, Appropriate, Calm, Pleasant Orientation:  Oriented to Self, Oriented to Place, Oriented to  Time, Oriented to Situation Alcohol / Substance use:  Not Applicable Psych involvement (Current and /or in the community):  No (Comment) (Not appropriate on this admission.)  Discharge Needs  Concerns to be addressed:  No discharge needs identified Readmission within the last 30 days:  No Current discharge risk:  None Barriers to Discharge:  No Barriers Identified   Caroline Sauger, LCSW 04/25/2015, 2:25 PM [phone]

## 2015-04-25 NOTE — Progress Notes (Signed)
Physical Therapy Treatment Patient Details Name: James Gates MRN: [id number] DOB: 1951-02-27 Today's Date: 04/25/2015    History of Present Illness Pt is a 64 y/o M s/p Rt THA. Pt's PMH inlcudes dermatitis, lumbar spinal stenosis.    PT Comments    James Gates remains very motivated and ambulated 100 ft in hallway.  Remains a high fall risk 2/2 dec gait speed; however is progressing well w/ therapy.  Pt will benefit from continued skilled PT services to increase functional independence and safety.   Follow Up Recommendations  SNF     Equipment Recommendations  Other (comment) (TBD by next venue of care)    Recommendations for Other Services       Precautions / Restrictions Precautions Precautions: Anterior Hip;Fall Precaution Comments: Pt able to recall 2/3 hip precatuions, omitting hip ER. Restrictions Weight Bearing Restrictions: Yes RLE Weight Bearing: Weight bearing as tolerated    Mobility  Bed Mobility Overal bed mobility: Needs Assistance Bed Mobility: Supine to Sit     Supine to sit: HOB elevated;Min assist     General bed mobility comments: HOB elevated and use of bed rail to achieve supine>sit w/ increased time.  Min assist managing Rt LE to EOB.  Transfers Overall transfer level: Needs assistance Equipment used: Rolling walker (2 wheeled) Transfers: Sit to/from Stand Sit to Stand: Min guard         General transfer comment: Min guard and pt is slow to rise.  Pt w/ good technique.  Ambulation/Gait Ambulation/Gait assistance: Min guard Ambulation Distance (Feet): 100 Feet Assistive device: Rolling walker (2 wheeled) Gait Pattern/deviations: Step-to pattern;Step-through pattern;Antalgic;Trunk flexed;Decreased stride length;Decreased stance time - right   Gait velocity interpretation: <1.8 ft/sec, indicative of risk for recurrent falls General Gait Details: Trunk flexed which he says is his baseline 2/2 back pain.  Cues to stand as upright as he  can tolerate.    Stairs            Wheelchair Mobility    Modified Rankin (Stroke Patients Only)       Balance Overall balance assessment: Needs assistance Sitting-balance support: No upper extremity supported;Feet supported Sitting balance-Leahy Scale: Good     Standing balance support: Bilateral upper extremity supported;During functional activity Standing balance-Leahy Scale: Fair Standing balance comment: Pt able to doff gown in standing w/o either UE supported prior to sitting in recliner chair                    Cognition Arousal/Alertness: Awake/alert Behavior During Therapy: WFL for tasks assessed/performed Overall Cognitive Status: Within Functional Limits for tasks assessed                      Exercises Total Joint Exercises Ankle Circles/Pumps: AROM;Both;15 reps;Seated Gluteal Sets: Strengthening;Both;10 reps;Seated Long Arc Quad: Strengthening;Right;10 reps;Seated Knee Flexion: AROM;Right;10 reps;Standing Marching in Standing: Strengthening;Right;5 reps;Standing    General Comments        Pertinent Vitals/Pain Pain Assessment: 0-10 Pain Score: 3  Pain Location: Rt hip Pain Descriptors / Indicators: Discomfort;Dull Pain Intervention(s): Limited activity within patient's tolerance;Monitored during session;Repositioned    Home Living                      Prior Function            PT Goals (current goals can now be found in the care plan section) Acute Rehab PT Goals Patient Stated Goal: to get stronger PT Goal Formulation: With patient/family  Time For Goal Achievement: 05/01/15 Potential to Achieve Goals: Good Progress towards PT goals: Progressing toward goals    Frequency  7X/week    PT Plan Current plan remains appropriate    Co-evaluation             End of Session Equipment Utilized During Treatment: Gait belt Activity Tolerance: Patient limited by fatigue Patient left: in chair;with call  bell/phone within reach;with family/visitor present     Time: 7616-0737 PT Time Calculation (min) (ACUTE ONLY): 21 min  Charges:  $Gait Training: 8-22 mins                    G Codes:      Joslyn Hy PT, [place] [phone] Pager: [phone] 04/25/2015, 4:22 PM

## 2015-04-25 NOTE — Evaluation (Signed)
Occupational Therapy Evaluation Patient Details Name: James Gates MRN: [id number] DOB: 01-Aug-1951 Today's Date: 04/25/2015    History of Present Illness Pt is a 64 y/o M s/p Rt THA. Pt's PMH inlcudes dermatitis, lumbar spinal stenosis.   Clinical Impression   Patient is s/p R THA anterior approach surgery resulting in functional limitations due to the deficits listed below (see OT problem list). Pt with pre arranged plans to d/c [facility].  Patient will benefit from skilled OT acutely to increase independence and safety with ADLS to allow discharge SNF. OT to defer all further treatment to the SNF facility due to decr balance and activity tolerance with adls    Follow Up Recommendations  SNF    Equipment Recommendations  Other (comment) (defer)    Recommendations for Other Services       Precautions / Restrictions Precautions Precautions: Anterior Hip;Fall Precaution Comments: handout present and pt recalled all precautions Restrictions Weight Bearing Restrictions: Yes RLE Weight Bearing: Weight bearing as tolerated      Mobility Bed Mobility               General bed mobility comments: in chair on arrival  Transfers Overall transfer level: Needs assistance Equipment used: Rolling walker (2 wheeled) Transfers: Sit to/from Stand Sit to Stand: Min assist         General transfer comment: cues for hand placement and static standing once upright    Balance Overall balance assessment: Needs assistance         Standing balance support: Bilateral upper extremity supported;During functional activity Standing balance-Leahy Scale: Poor                              ADL Overall ADL's : Needs assistance/impaired     Grooming: Wash/dry face;Oral care;Set up;Sitting     Upper Body Bathing Details (indicate cue type and reason): pt finished bath in chair and reports "its kinda difficult right now" Pt able to complete knees upward. pt  reports trouble with below knees and buttock Lower Body Bathing: Moderate assistance           Toilet Transfer: Minimal assistance;Ambulation;RW;BSC     Toileting - Clothing Manipulation Details (indicate cue type and reason): educated on gown management and hygiene     Functional mobility during ADLs: Min guard;Rolling walker General ADL Comments: Pt very pleasant and willing to complete OT assessment. pt educated on RW management to prevent toes passing anterior wheels of RW. Pt with good return demo of precautions     Vision     Perception     Praxis      Pertinent Vitals/Pain Pain Assessment: 0-10 Pain Score: 7  Pain Location: R hip Pain Descriptors / Indicators: Constant Pain Intervention(s): Monitored during session;Repositioned;Ice applied     Hand Dominance Right   Extremity/Trunk Assessment Upper Extremity Assessment Upper Extremity Assessment: Overall WFL for tasks assessed   Lower Extremity Assessment Lower Extremity Assessment: Defer to PT evaluation   Cervical / Trunk Assessment Cervical / Trunk Assessment: Normal   Communication Communication Communication: No difficulties   Cognition Arousal/Alertness: Awake/alert Behavior During Therapy: WFL for tasks assessed/performed Overall Cognitive Status: Within Functional Limits for tasks assessed                     General Comments       Exercises       Shoulder Instructions  Home Living Family/patient expects to be discharged to:: Skilled nursing facility                                        Prior Functioning/Environment Level of Independence: Independent with assistive device(s)        Comments: PTA pt would use walking pole prn and would hold onto things around the house    OT Diagnosis: Generalized weakness;Acute pain   OT Problem List: Decreased strength;Decreased activity tolerance;Impaired balance (sitting and/or standing);Decreased safety  awareness;Decreased knowledge of use of DME or AE;Decreased knowledge of precautions;Obesity;Pain   OT Treatment/Interventions:      OT Goals(Current goals can be found in the care plan section) Acute Rehab OT Goals Patient Stated Goal: to get stronger Potential to Achieve Goals: Good  OT Frequency:     Barriers to D/C:            Co-evaluation              End of Session Equipment Utilized During Treatment: Gait belt;Rolling walker Nurse Communication: Mobility status;Precautions  Activity Tolerance: Patient tolerated treatment well Patient left: in chair;with call bell/phone within reach   Time: 5852-7782 OT Time Calculation (min): 20 min Charges:  OT General Charges $OT Visit: 1 Procedure OT Evaluation $Initial OT Evaluation Tier I: 1 Procedure G-Codes:    Peri Maris 04-30-2015, 11:26 AM   Jeri Modena   OTR/L Pager: [phone] Office: [phone] .

## 2015-04-26 LAB — CBC
HCT: 34.8 % — ABNORMAL LOW (ref 39.0–52.0)
Hemoglobin: 11.6 g/dL — ABNORMAL LOW (ref 13.0–17.0)
MCH: 29.2 pg (ref 26.0–34.0)
MCHC: 33.3 g/dL (ref 30.0–36.0)
MCV: 87.7 fL (ref 78.0–100.0)
PLATELETS: 98 10*3/uL — AB (ref 150–400)
RBC: 3.97 MIL/uL — ABNORMAL LOW (ref 4.22–5.81)
RDW: 12.9 % (ref 11.5–15.5)
WBC: 13.8 10*3/uL — AB (ref 4.0–10.5)

## 2015-04-26 MED ORDER — ASPIRIN EC 325 MG PO TBEC: 325.0000 mg | DELAYED_RELEASE_TABLET | Freq: Two times a day (BID) | ORAL | Status: DC

## 2015-04-26 MED ORDER — OXYCODONE-ACETAMINOPHEN 5-325 MG PO TABS: 1.0000 | ORAL_TABLET | ORAL | Status: DC | PRN

## 2015-04-26 MED ORDER — METHOCARBAMOL 500 MG PO TABS: 500.0000 mg | ORAL_TABLET | Freq: Two times a day (BID) | ORAL | Status: DC

## 2015-04-26 NOTE — Progress Notes (Signed)
Physical Therapy Treatment Patient Details Name: James Gates MRN: [id number] DOB: Apr 08, 1951 Today's Date: 04/26/2015    History of Present Illness Pt is a 64 y/o M s/p Rt THA. Pt's PMH inlcudes dermatitis, lumbar spinal stenosis.    PT Comments    Mr. Muska demonstrated excellent progress this session and ambulated in hall w/ supervision.  He is anticipating d/c to SNF today and will benefit from continued skilled PT services to increase functional independence and safety.   Follow Up Recommendations  SNF     Equipment Recommendations  Other (comment) (TBD by next venue of care)    Recommendations for Other Services       Precautions / Restrictions Precautions Precautions: Anterior Hip;Fall Precaution Comments: Pt able to recall 3/3 hip precautions w/ increased time Restrictions Weight Bearing Restrictions: Yes RLE Weight Bearing: Weight bearing as tolerated    Mobility  Bed Mobility               General bed mobility comments: Pt in recliner upon PT arrival  Transfers Overall transfer level: Needs assistance Equipment used: Rolling walker (2 wheeled) Transfers: Sit to/from Stand Sit to Stand: Supervision         General transfer comment: Supervision for safety as pt is slow to rise.  Good technique.  Ambulation/Gait Ambulation/Gait assistance: Supervision Ambulation Distance (Feet): 200 Feet Assistive device: Rolling walker (2 wheeled) Gait Pattern/deviations: Step-through pattern;Antalgic;Decreased weight shift to right   Gait velocity interpretation: Below normal speed for age/gender General Gait Details: Trunk flexed which he says is his baseline 2/2 back pain. Dec gait speed, supervision for safety.   Stairs            Wheelchair Mobility    Modified Rankin (Stroke Patients Only)       Balance Overall balance assessment: Needs assistance Sitting-balance support: No upper extremity supported;Feet supported Sitting balance-Leahy  Scale: Good     Standing balance support: During functional activity;No upper extremity supported Standing balance-Leahy Scale: Fair                      Cognition Arousal/Alertness: Awake/alert Behavior During Therapy: WFL for tasks assessed/performed Overall Cognitive Status: Within Functional Limits for tasks assessed                      Exercises Total Joint Exercises Ankle Circles/Pumps: AROM;Both;15 reps;Seated Heel Slides: AAROM;Right;10 reps;Seated;Limitations Heel Slides Limitations: Tendency to ER Rt hip w/ this exercise and therefore educated pt not to do this exercise unless he has a therapist assisting him Long Arc Quad: Strengthening;Right;10 reps;Seated General Exercises - Lower Extremity Mini-Sqauts: Strengthening;Both;10 reps;Standing    General Comments        Pertinent Vitals/Pain Pain Assessment: 0-10 Pain Score: 6  Pain Location: Rt hip Pain Descriptors / Indicators: Discomfort;Dull Pain Intervention(s): Limited activity within patient's tolerance;Monitored during session;Repositioned    Home Living                      Prior Function            PT Goals (current goals can now be found in the care plan section) Acute Rehab PT Goals Patient Stated Goal: to get stronger PT Goal Formulation: With patient/family Time For Goal Achievement: 05/01/15 Potential to Achieve Goals: Good Progress towards PT goals: Progressing toward goals    Frequency  7X/week    PT Plan Current plan remains appropriate    Co-evaluation  End of Session Equipment Utilized During Treatment: Gait belt Activity Tolerance: Patient tolerated treatment well Patient left: in chair;with call bell/phone within reach     Time: 1139-1156 PT Time Calculation (min) (ACUTE ONLY): 17 min  Charges:  $Gait Training: 8-22 mins                    G Codes:      Joslyn Hy PT, [place] [phone] Pager: [phone] 04/26/2015, 12:03 PM

## 2015-04-26 NOTE — Progress Notes (Signed)
PATIENT ID: James Gates  MRN: [id number]  DOB/AGE:  65/21/1952 / 64 y.o.  2 Days Post-Op Procedure(s) (LRB): TOTAL HIP ARTHROPLASTY ANTERIOR APPROACH (Right)    PROGRESS NOTE Subjective: Patient is alert, oriented, no Nausea, no Vomiting, yes passing gas, no Bowel Movement. Taking PO well. Denies SOB, Chest or Calf Pain. Using Incentive Spirometer, PAS in place. Ambulate WBAT with pt walking 100 ft with therapy Patient reports pain as 4 on 0-10 scale  .    Objective: Vital signs in last 24 hours: Filed Vitals:   04/25/15 0540 04/25/15 1451 04/25/15 1953 04/26/15 0519  BP: 127/70 136/74 128/70 114/58  Pulse: 105 107 125 95  Temp: 100.7 F (38.2 C) 99.1 F (37.3 C) 98.7 F (37.1 C) 98.7 F (37.1 C)  TempSrc: Oral Oral Oral Oral  Resp: 18 18 18 16   Height:      Weight:      SpO2: 95% 98% 96% 97%      Intake/Output from previous day: I/O last 3 completed shifts: In: 2540.4 [P.O.:780; I.V.:1760.4] Out: 1510 [Urine:1510]   Intake/Output this shift:     LABORATORY DATA:  Recent Labs  04/25/15 0430 04/26/15 0454  WBC 9.0 13.8*  HGB 12.6* 11.6*  HCT 38.4* 34.8*  PLT 136* 98*    Examination: Neurologically intact Neurovascular intact Sensation intact distally Intact pulses distally Dorsiflexion/Plantar flexion intact Incision: dressing C/D/I No cellulitis present Compartment soft} XR AP&Lat of hip shows well placed\fixed THA  Assessment:   2 Days Post-Op Procedure(s) (LRB): TOTAL HIP ARTHROPLASTY ANTERIOR APPROACH (Right) ADDITIONAL DIAGNOSIS:  Expected Acute Blood Loss Anemia, spinal stenosis  Plan: PT/OT WBAT, THA  Anterior  precautions  DVT Prophylaxis: SCDx72 hrs, ASA 325 mg BID x 2 weeks  DISCHARGE PLAN: Skilled Nursing Facility/Rehab, [facility] available  DISCHARGE NEEDS: Walker and 3-in-1 comode seat

## 2015-04-26 NOTE — Discharge Instructions (Signed)

## 2015-04-26 NOTE — Discharge Summary (Signed)
Patient ID: ROI JAFARI MRN: [id number] DOB/AGE: Jun 08, 1951 64 y.o.  Admit date: 04/24/2015 Discharge date: 04/26/2015  Admission Diagnoses:  Principal Problem:   Primary osteoarthritis of right hip   Discharge Diagnoses:  Same  Past Medical History  Diagnosis Date  . Hyperlipidemia   . Colon polyps   . Spinal stenosis of lumbar region   . Sleep apnea, obstructive 2011    uses CPAP nightly ; sleep study done @ [place]  . GERD (gastroesophageal reflux disease)   . Arthritis   . History of BPH   . BPH (benign prostatic hypertrophy) with urinary retention   . Wears partial dentures     Surgeries: Procedure(s): TOTAL HIP ARTHROPLASTY ANTERIOR APPROACH on 04/24/2015   Consultants:    Discharged Condition: Improved  Hospital Course: DIMITRIUS STEEDMAN is an 64 y.o. male who was admitted 04/24/2015 for operative treatment ofPrimary osteoarthritis of right hip. Patient has severe unremitting pain that affects sleep, daily activities, and work/hobbies. After pre-op clearance the patient was taken to the operating room on 04/24/2015 and underwent  Procedure(s): TOTAL HIP ARTHROPLASTY ANTERIOR APPROACH.    Patient was given perioperative antibiotics: Anti-infectives    Start     Dose/Rate Route Frequency Ordered Stop   04/24/15 0900  ceFAZolin (ANCEF) IVPB 2 g/50 mL premix     2 g 100 mL/hr over 30 Minutes Intravenous To [facility] 04/21/15 1222 04/24/15 0956       Patient was given sequential compression devices, early ambulation, and chemoprophylaxis to prevent DVT.  Patient benefited maximally from hospital stay and there were no complications.    Recent vital signs: Patient Vitals for the past 24 hrs:  BP Temp Temp src Pulse Resp SpO2  04/26/15 0519 (!) 114/58 mmHg 98.7 F (37.1 C) Oral 95 16 97 %  04/25/15 1953 128/70 mmHg 98.7 F (37.1 C) Oral (!) 125 18 96 %  04/25/15 1451 136/74 mmHg 99.1 F (37.3 C) Oral (!) 107 18 98 %     Recent laboratory  studies:  Recent Labs  04/25/15 0430 04/26/15 0454  WBC 9.0 13.8*  HGB 12.6* 11.6*  HCT 38.4* 34.8*  PLT 136* 98*     Discharge Medications:     Medication List    STOP taking these medications        ALEVE 220 MG tablet  Generic drug:  naproxen sodium     aspirin 81 MG tablet  Replaced by:  aspirin EC 325 MG tablet      TAKE these medications        aspirin EC 325 MG tablet  Take 1 tablet (325 mg total) by mouth 2 (two) times daily.     methocarbamol 500 MG tablet  Commonly known as:  ROBAXIN  Take 1 tablet (500 mg total) by mouth 2 (two) times daily with a meal.     oxyCODONE-acetaminophen 5-325 MG per tablet  Commonly known as:  ROXICET  Take 1 tablet by mouth every 4 (four) hours as needed.        Diagnostic Studies: Dg Chest 2 View  04/13/2015   CLINICAL DATA:  Smoker.  EXAM: CHEST  2 VIEW  COMPARISON:  None.  FINDINGS: No acute cardiopulmonary disease. Heart size normal. Degenerative changes thoracic spine.  IMPRESSION: No acute cardiopulmonary disease.   Electronically Signed   By: Marcello Moores  Register   On: 04/13/2015 13:16   Dg Pelvis Portable  04/24/2015   CLINICAL DATA:  Postop right total hip.  EXAM: PORTABLE PELVIS  1-2 VIEWS  COMPARISON:  None  FINDINGS: There is a a right total hip arthroplasty device. The hardware components are in anatomic alignment. No complicating features. No radio-opaque foreign body or soft tissue calcification.  IMPRESSION: 1. Status post right total hip arthroplasty.  No complications.   Electronically Signed   By: Kerby Moors M.D.   On: 04/24/2015 14:31   Dg Hip Operative Unilat With Pelvis Right  04/24/2015   CLINICAL DATA:  Right hip arthroplasty.  EXAM: OPERATIVE RIGHT HIP (WITH PELVIS IF PERFORMED) FLUOROSCOPIC VIEWS  TECHNIQUE: Fluoroscopic spot image(s) were submitted for interpretation post-operatively.  COMPARISON:  None.  FINDINGS: Two fluoroscopic images demonstrate total right hip arthroplasty with long femoral  component. The hardware is well seated within the acetabulum. No evidence of immediate complications.  IMPRESSION: Fluoroscopic images from right hip arthroplasty. No immediate complications apparent.   Electronically Signed   By: Fidela Salisbury M.D.   On: 04/24/2015 12:20    Disposition: Final discharge disposition not confirmed      Discharge Instructions    Call MD / Call 911    Complete by:  As directed   If you experience chest pain or shortness of breath, CALL 911 and be transported to the hospital emergency room.  If you develope a fever above 101 F, pus (white drainage) or increased drainage or redness at the wound, or calf pain, call your surgeon's office.     Change dressing    Complete by:  As directed   You may change your dressing on day 5, then change the dressing daily with sterile 4 x 4 inch gauze dressing and paper tape.  You may clean the incision with alcohol prior to redressing     Constipation Prevention    Complete by:  As directed   Drink plenty of fluids.  Prune juice may be helpful.  You may use a stool softener, such as Colace (over the counter) 100 mg twice a day.  Use MiraLax (over the counter) for constipation as needed.     Diet - low sodium heart healthy    Complete by:  As directed      Discharge instructions    Complete by:  As directed   Follow up in office with Dr. Mayer Camel in 2 weeks.     Driving restrictions    Complete by:  As directed   No driving for 2 weeks     Follow the hip precautions as taught in Physical Therapy    Complete by:  As directed      Increase activity slowly as tolerated    Complete by:  As directed      Patient may shower    Complete by:  As directed   You may shower without a dressing once there is no drainage.  Do not wash over the wound.  If drainage remains, cover wound with plastic wrap and then shower.           Follow-up Information    Follow up with Kerin Salen, MD In 2 weeks.   Specialty:  Orthopedic Surgery    Contact information:   [place] [address] [phone]        Signed: Theodosia Quay 04/26/2015, 7:52 AM

## 2015-04-26 NOTE — Progress Notes (Signed)
James Gates discharged to [facility] per MD order. All questions and concerns answered. Copy of instructions and scripts given to patient. IV removed.  Patient escorted by [facility]. No distress noted upon discharge.   [name] [place] 04/26/2015 1:00 PM

## 2015-04-28 ENCOUNTER — Non-Acute Institutional Stay (SKILLED_NURSING_FACILITY): Payer: Medicare Other | Admitting: Internal Medicine

## 2015-04-28 DIAGNOSIS — M62838 Other muscle spasm: Secondary | ICD-10-CM

## 2015-04-28 DIAGNOSIS — M1611 Unilateral primary osteoarthritis, right hip: Secondary | ICD-10-CM | POA: Diagnosis not present

## 2015-04-28 DIAGNOSIS — D62 Acute posthemorrhagic anemia: Secondary | ICD-10-CM

## 2015-04-28 DIAGNOSIS — K5901 Slow transit constipation: Secondary | ICD-10-CM | POA: Diagnosis not present

## 2015-04-28 DIAGNOSIS — D72829 Elevated white blood cell count, unspecified: Secondary | ICD-10-CM

## 2015-04-28 DIAGNOSIS — R2681 Unsteadiness on feet: Secondary | ICD-10-CM

## 2015-04-28 NOTE — Progress Notes (Signed)
Patient ID: James Gates, male   DOB: 03-01-1951, 64 y.o.   MRN: [id number]     Facility: [facility]    PCP: Bartholome Bill, MD  Code Status: full code  No Known Allergies  Chief Complaint  Patient presents with  . New Admit To SNF     HPI:  64 y.o. patient is here for short term rehabilitation post hospital admission from 04/24/15-04/26/15 with right hip OA. He underwent right total hip arthroplasty. His pain is under control but has been experiencing tightness in right hip. He has not had a bowel movement since prior to surgery. No other complaints. See below.  Review of Systems:  Constitutional: Negative for fever, chills, malaise/fatigue and diaphoresis.  HENT: Negative for headache, congestion, nasal discharge  Eyes: Negative for eye pain, blurred vision, double vision and discharge.  Respiratory: Negative for cough, shortness of breath and wheezing.   Cardiovascular: Negative for chest pain, palpitations, leg swelling.  Gastrointestinal: Negative for heartburn, nausea, vomiting, abdominal pain Genitourinary: Negative for dysuria Musculoskeletal: Negative for back pain, falls Skin: Negative for itching, rash.  Neurological: Negative for dizziness, tingling, focal weakness Psychiatric/Behavioral: Negative for depression   Past Medical History  Diagnosis Date  . Hyperlipidemia   . Colon polyps   . Spinal stenosis of lumbar region   . Sleep apnea, obstructive 2011    uses CPAP nightly ; sleep study done @ [place]  . GERD (gastroesophageal reflux disease)   . Arthritis   . History of BPH   . BPH (benign prostatic hypertrophy) with urinary retention   . Wears partial dentures    Past Surgical History  Procedure Laterality Date  . Colonoscopy w/ polypectomy    . No past surgeries    . Total hip arthroplasty Right 04/24/2015    Procedure: TOTAL HIP ARTHROPLASTY ANTERIOR APPROACH;  Surgeon: Frederik Pear, MD;  Location: [place];   Service: Orthopedics;  Laterality: Right;   Social History:   reports that he has quit smoking. He has never used smokeless tobacco. He reports that he does not use illicit drugs. His alcohol history is not on file.  Family History  Problem Relation Age of Onset  . Ulcers    . Lung cancer Father     Medications:   Medication List       This list is accurate as of: 04/28/15  4:13 PM.  Always use your most recent med list.               aspirin EC 325 MG tablet  Take 1 tablet (325 mg total) by mouth 2 (two) times daily.     methocarbamol 500 MG tablet  Commonly known as:  ROBAXIN  Take 1 tablet (500 mg total) by mouth 2 (two) times daily with a meal.     oxyCODONE-acetaminophen 5-325 MG per tablet  Commonly known as:  ROXICET  Take 1 tablet by mouth every 4 (four) hours as needed.         Physical Exam: Filed Vitals:   04/28/15 1612  BP: 119/60  Pulse: 88  Temp: 98 F (36.7 C)  Resp: 18  SpO2: 97%    General- elderly male, well built, in no acute distress Head- normocephalic, atraumatic Throat- moist mucus membrane Eyes- PERRLA, EOMI, no pallor, no icterus, no discharge, normal conjunctiva, normal sclera Neck- no cervical lymphadenopathy Cardiovascular- normal s1,s2, no murmurs, palpable dorsalis pedis and radial pulses, trace right leg edema Respiratory- bilateral clear to auscultation,  no wheeze, no rhonchi, no crackles, no use of accessory muscles Abdomen- bowel sounds present, soft, non tender Musculoskeletal- able to move all 4 extremities, right hip ROM limited and muscle spasticity in hip region noted  Neurological- no focal deficit, alert and oriented to person, place and time Skin- warm and dry, right hip incision with aquacel dressing Psychiatry- normal mood and affect    Labs reviewed: Basic Metabolic Panel:  Recent Labs  04/13/15 1125  NA 138  K 4.5  CL 105  CO2 26  GLUCOSE 109*  BUN 13  CREATININE 1.23  CALCIUM 9.8   Liver  Function Tests: No results for input(s): AST, ALT, ALKPHOS, BILITOT, PROT, ALBUMIN in the last 8760 hours. No results for input(s): LIPASE, AMYLASE in the last 8760 hours. No results for input(s): AMMONIA in the last 8760 hours. CBC:  Recent Labs  04/13/15 1125 04/25/15 0430 04/26/15 0454  WBC 5.3 9.0 13.8*  NEUTROABS 2.6  --   --   HGB 16.4 12.6* 11.6*  HCT 48.0 38.4* 34.8*  MCV 86.8 88.3 87.7  PLT 162 136* 98*    Radiological Exams: Dg Chest 2 View  04/13/2015   CLINICAL DATA:  Smoker.  EXAM: CHEST  2 VIEW  COMPARISON:  None.  FINDINGS: No acute cardiopulmonary disease. Heart size normal. Degenerative changes thoracic spine.  IMPRESSION: No acute cardiopulmonary disease.   Electronically Signed   By: Marcello Moores  Register   On: 04/13/2015 13:16     Assessment/Plan  Unsteady gait Post surgery for primary OA of right hip. Will have him work with physical therapy and occupational therapy team to help with gait training and muscle strengthening exercises.fall precautions. Skin care. Encourage to be out of bed.   Right hip OA S/p right hip arthroplasty. To work with therapy team. Has f/u with orthopedics. Continue roxicet 5-325 mg q4h prn pain. Robaxin changes made as below. Continue aspirin 325 mg bid for dvt prophylaxis. Add ted hose to help with edema  Blood loss anemia Post op, monitor h&h  Constipation Add senna s 2 tab qhs with miralax daily and reassess. Hydration encouraged  Muscle spasm Increase robaxin to 500 mg po tid and monitor  Leukocytosis No signs of infection, dressing clean. Most likely reactive leukocytosis. Monitor cbc with diff   Goals of care: short term rehabilitation   Labs/tests ordered: cbc with diff, bmp 05/02/15  Family/ staff Communication: reviewed care plan with patient and nursing supervisor    Blanchie Serve, MD  [place] [phone] (Monday-Friday 8 am - 5 pm) [phone] (afterhours)

## 2015-05-03 ENCOUNTER — Non-Acute Institutional Stay (SKILLED_NURSING_FACILITY): Payer: Medicare Other | Admitting: Nurse Practitioner

## 2015-05-03 DIAGNOSIS — M62838 Other muscle spasm: Secondary | ICD-10-CM

## 2015-05-03 DIAGNOSIS — K5901 Slow transit constipation: Secondary | ICD-10-CM

## 2015-05-03 DIAGNOSIS — D72829 Elevated white blood cell count, unspecified: Secondary | ICD-10-CM

## 2015-05-03 DIAGNOSIS — M1611 Unilateral primary osteoarthritis, right hip: Secondary | ICD-10-CM

## 2015-05-03 DIAGNOSIS — R609 Edema, unspecified: Secondary | ICD-10-CM | POA: Diagnosis not present

## 2015-05-03 DIAGNOSIS — D62 Acute posthemorrhagic anemia: Secondary | ICD-10-CM | POA: Diagnosis not present

## 2015-05-03 NOTE — Progress Notes (Signed)
Patient ID: James Gates, male   DOB: April 24, 1951, 64 y.o.   MRN: [id number]    Nursing Home Location:  [place] of Service: SNF (31)  PCP: Bartholome Bill, MD  No Known Allergies  Chief Complaint  Patient presents with  . Discharge Note    HPI:  Patient is a 64 y.o. male seen today at [facility] for discharge home. Pt at [facility] for short term rehabilitation post hospital admission from 04/24/15-04/26/15 with right hip OA. He underwent right total hip arthroplasty. His pain is under control. Had been been experiencing tightness in right hip but this has improved with robaxin. Bowels moving better with current regimen.Patient currently doing well with therapy, now stable to discharge home with home health.  Review of Systems:  Review of Systems  Constitutional: Negative for activity change, appetite change, fatigue and unexpected weight change.  Eyes: Negative.   Respiratory: Negative for cough and shortness of breath.   Cardiovascular: Positive for leg swelling (right). Negative for chest pain and palpitations.  Gastrointestinal: Negative for abdominal pain, diarrhea and constipation.  Genitourinary: Negative for dysuria and difficulty urinating.  Musculoskeletal:       Right hip and leg Pain controlled with current medication  Skin: Negative for color change and wound.  Neurological: Positive for facial asymmetry. Negative for dizziness and weakness.  Psychiatric/Behavioral: Negative for behavioral problems, confusion and agitation.    Past Medical History  Diagnosis Date  . Hyperlipidemia   . Colon polyps   . Spinal stenosis of lumbar region   . Sleep apnea, obstructive 2011    uses CPAP nightly ; sleep study done @ [place]  . GERD (gastroesophageal reflux disease)   . Arthritis   . History of BPH   . BPH (benign prostatic hypertrophy) with urinary retention   . Wears partial dentures    Past Surgical  History  Procedure Laterality Date  . Colonoscopy w/ polypectomy    . No past surgeries    . Total hip arthroplasty Right 04/24/2015    Procedure: TOTAL HIP ARTHROPLASTY ANTERIOR APPROACH;  Surgeon: Frederik Pear, MD;  Location: [place];  Service: Orthopedics;  Laterality: Right;   Social History:   reports that he has quit smoking. He has never used smokeless tobacco. He reports that he does not use illicit drugs. His alcohol history is not on file.  Family History  Problem Relation Age of Onset  . Ulcers    . Lung cancer Father     Medications: Patient's Medications  New Prescriptions   No medications on file  Previous Medications   ASPIRIN EC 325 MG TABLET    Take 1 tablet (325 mg total) by mouth 2 (two) times daily.   METHOCARBAMOL (ROBAXIN) 500 MG TABLET    Take 1 tablet (500 mg total) by mouth 2 (two) times daily with a meal.   OXYCODONE-ACETAMINOPHEN (ROXICET) 5-325 MG PER TABLET    Take 1 tablet by mouth every 4 (four) hours as needed.  Modified Medications   No medications on file  Discontinued Medications   No medications on file     Physical Exam: Filed Vitals:   05/03/15 1636  BP: 122/73  Pulse: 97  Temp: 97.9 F (36.6 C)  Resp: 20    Physical Exam  Constitutional: He is oriented to person, place, and time. He appears well-developed and well-nourished. No distress.  HENT:  Head: Normocephalic and atraumatic.  Mouth/Throat: Oropharynx is  clear and moist. No oropharyngeal exudate.  Eyes: Conjunctivae and EOM are normal. Pupils are equal, round, and reactive to light.  Neck: Normal range of motion. Neck supple.  Cardiovascular: Normal rate, regular rhythm and normal heart sounds.   Pulmonary/Chest: Effort normal and breath sounds normal.  Abdominal: Soft. Bowel sounds are normal.  Musculoskeletal: He exhibits edema (1+ edema to right leg). He exhibits no tenderness.  Neurological: He is alert and oriented to person, place, and time.  Skin: Skin is warm and  dry. He is not diaphoretic.  Right hip with Aquacel dressing, bruising noted to right lower thigh   Psychiatric: He has a normal mood and affect.    Labs reviewed: Basic Metabolic Panel:  Recent Labs  04/13/15 1125  NA 138  K 4.5  CL 105  CO2 26  GLUCOSE 109*  BUN 13  CREATININE 1.23  CALCIUM 9.8   Liver Function Tests: No results for input(s): AST, ALT, ALKPHOS, BILITOT, PROT, ALBUMIN in the last 8760 hours. No results for input(s): LIPASE, AMYLASE in the last 8760 hours. No results for input(s): AMMONIA in the last 8760 hours. CBC:  Recent Labs  04/13/15 1125 04/25/15 0430 04/26/15 0454  WBC 5.3 9.0 13.8*  NEUTROABS 2.6  --   --   HGB 16.4 12.6* 11.6*  HCT 48.0 38.4* 34.8*  MCV 86.8 88.3 87.7  PLT 162 136* 98*   TSH: No results for input(s): TSH in the last 8760 hours. A1C: Lab Results  Component Value Date   HGBA1C 5.8 05/19/2012   Lipid Panel: No results for input(s): CHOL, HDL, LDLCALC, TRIG, CHOLHDL, LDLDIRECT in the last 8760 hours.  CBC w/Diff & PLT Final 05/02/2015 4:28PM TEST RESULT REF RANGE UNIT White Blood Cell (WBC) 8.1 3.8-10.8 k/uL Red Blood Cell (RBC) L 3.8 4.3-6.0 M/uL Hemoglobin (HGB) L 10.7 13.0-18.0 g/dL Hematocrit (HCT) L 34.7 39.0-54.0 % Mean Corpuscular Volume (MCV) 90.8 79.0-100.0 fL Mean Corpuscular HGB (MCH) 28.0 26.8-33.2 pg Mean Corpuscular HGB Conc (MCHC) 30.8 30.5-36.0 g/dL RBC Dist Width (RDW) 12.9 9.0-15.0 % Platelet 220 150-450 k/uL Neutrophils% 57.6 50.0-80.0 % Lymphocytes% 24.0 21.0-51.0 % Monocytes% H 14.3 2.0-12.0 % Eosinophils% 2.1 0.0-5.0 % Basophils% 0.6 0.0-2.0 % Neutrophils# 4.7 1.5-6.6 k/uL Lymphocytes # 1.9 1.5-3.5 k/uL Monocytes # H 1.2 0.1-1.0 k/uL Eosinophils# 0.2 0.0-0.6 k/uL Basophils# 0.1 0.0-0.1 k/uL  Assessment/Plan 1. Primary osteoarthritis of right hip S/p right hip arthroplasty. Pain is well controlled on roxicet 5-325 mg q4h prn pain. Continue aspirin 325 mg bid for dvt prophylaxis.   -ortho follow up next week  2. Slow transit constipation Improved on stool softener  3. Acute blood loss anemia -post op- hgb at 10.7 on last labs, asymptomatic, no shortness of breath, chest pains -pt to follow up with PCP next week for follow up on hgb  4. Leukocytosis Has improved  5. Muscle spasm -stable, improved on robaxain 500 mg TID  6. Edema -to right lower leg remains stable, conts with TED hose with good results.  pt is stable for discharge-will need PT/OT per home health. No DME needed. Rx written.  will need to follow up with PCP next week.   Carlos American. Harle Battiest  [facility] [phone] 8 am - 5 pm) [phone] (after hours)

## 2015-09-07 ENCOUNTER — Ambulatory Visit (INDEPENDENT_AMBULATORY_CARE_PROVIDER_SITE_OTHER): Payer: Medicare Other | Admitting: Cardiovascular Disease

## 2015-09-07 ENCOUNTER — Encounter: Payer: Self-pay | Admitting: Cardiovascular Disease

## 2015-09-07 VITALS — BP 119/78 | HR 81 | Ht 69.0 in | Wt 208.1 lb

## 2015-09-07 DIAGNOSIS — M1611 Unilateral primary osteoarthritis, right hip: Secondary | ICD-10-CM | POA: Diagnosis not present

## 2015-09-07 DIAGNOSIS — G4733 Obstructive sleep apnea (adult) (pediatric): Secondary | ICD-10-CM | POA: Diagnosis not present

## 2015-09-07 NOTE — Patient Instructions (Signed)
Your physician wants you to follow-up in:1 year or sooner if needed for sleep with Dr Claiborne Billings. You will receive a reminder letter in the mail two months in advance. If you don't receive a letter, please call our office to schedule the follow-up appointment.

## 2015-09-07 NOTE — Progress Notes (Signed)
Patient ID: [name], male   DOB: 15-Nov-1950, 65 y.o.   MRN: [id number]     HPI: James Gates is a 65 y.o. male who presents for sleep clinic evaluation.  I had seen him approximately 5 or more years ago.  James Gates is the husband of James Gates.  In 2009 he underwent a sleep study which revealed mild  to moderate sleep apnea with an AHI of 12.8 overall endurance in rem sleep 20.1.  There was moderate snoring and he dropped his oxygen 82%.  In January 2010 he underwent a CPAP titration and 15 cm water pressure was recommended.  In states that he had been using CPAP fairly well but underwent right hip surgery in August 2016 and essentially has not used therapy since that time.  A download, which was obtained from July 25 through 04/18/2015 showed reduced compliance with only 17 out of 30 days with device usage and his average usage was only 3 hours and 54 minutes.  However, his AHI when used was excellent at 1.1.  He has R [employer] CPAP unit.  He states that he has lost 25 pounds since his hip surgery.  He also has back issues in the future may require back surgery.  He presents for evaluation.  Epworth Sleepiness Scale: Situation   Chance of Dozing/Sleeping (0 = never , 1 = slight chance , 2 = moderate chance , 3 = high chance )   sitting and reading 0   watching TV 1   sitting inactive in a public place 0   being a passenger in a motor vehicle for an hour or more 0   lying down in the afternoon 2   sitting and talking to someone 0   sitting quietly after lunch (no alcohol) 1   while stopped for a few minutes in traffic as the driver 0   Total Score  4    Past Medical History  Diagnosis Date  . Hyperlipidemia   . Colon polyps   . Spinal stenosis of lumbar region   . Sleep apnea, obstructive 2011    uses CPAP nightly ; sleep study done @ [facility]  . GERD (gastroesophageal reflux disease)   . Arthritis   . History of BPH   . BPH (benign prostatic  hypertrophy) with urinary retention   . Wears partial dentures     Past Surgical History  Procedure Laterality Date  . Colonoscopy w/ polypectomy    . No past surgeries    . Total hip arthroplasty Right 04/24/2015    Procedure: TOTAL HIP ARTHROPLASTY ANTERIOR APPROACH;  Surgeon: Frederik Pear, MD;  Location: [facility];  Service: Orthopedics;  Laterality: Right;    No Known Allergies  Current Outpatient Prescriptions  Medication Sig Dispense Refill  . aspirin 81 MG tablet Take 81 mg by mouth daily.     No current facility-administered medications for this visit.    Social History   Social History  . Marital Status: Married    Spouse Name: N/A  . Number of Children: N/A  . Years of Education: N/A   Occupational History  . Not on file.   Social History Main Topics  . Smoking status: Former Research scientist (life sciences)  . Smokeless tobacco: Never Used     Comment: quit smoking 1983  . Alcohol Use: Not on file  . Drug Use: No  . Sexual Activity: Yes   Other Topics Concern  . Not on file   Social  History Narrative    Family History  Problem Relation Age of Onset  . Ulcers    . Lung cancer Father      ROS General: Negative; No fevers, chills, or night sweats HEENT: Negative; No changes in vision or hearing, sinus congestion, difficulty swallowing Pulmonary: Negative; No cough, wheezing, shortness of breath, hemoptysis Cardiovascular: Negative; No chest pain, presyncope, syncope, palpatations GI: Negative; No nausea, vomiting, diarrhea, or abdominal pain GU: Negative; No dysuria, hematuria, or difficulty voiding Musculoskeletal: Negative; no myalgias, joint pain, or weakness Hematologic: Negative; no easy bruising, bleeding Endocrine: Negative; no heat/cold intolerance Neuro: Negative; no changes in balance, headaches Skin: Negative; No rashes or skin lesions Psychiatric: Negative; No behavioral problems, depression Sleep: Negative; No daytime sleepiness, hypersomnolence, bruxism,  restless legs, hypnogognic hallucinations, no cataplexy   Physical Exam BP 119/78 mmHg  Pulse 81  Ht '5\' 9"'  (1.753 m)  Wt 208 lb 1.6 oz (94.394 kg)  BMI 30.72 kg/m2  Wt Readings from Last 3 Encounters:  09/07/15 208 lb 1.6 oz (94.394 kg)  04/24/15 215 lb (97.523 kg)  04/13/15 215 lb 6.2 oz (97.699 kg)   General: Alert, oriented, no distress.  Skin: normal turgor, no rashes HEENT: Normocephalic, atraumatic. Pupils round and reactive; sclera anicteric; extraocular muscles intact; Fundi without hemorrhages or exudates. Nose without nasal septal hypertrophy Mouth/Parynx benign; Mallinpatti scale 3 Neck: No JVD, no carotid bruits with normal carotid upstroke Lungs: clear to ausculatation and percussion; no wheezing or rales  Chest wall: No tenderness to palpation Heart: RRR, s1 s2 normal; no S3 or S4 gallop.  No rubs thrills or heaves. Abdomen: soft, nontender; no hepatosplenomehaly, BS+; abdominal aorta nontender and not dilated by palpation. Back: No CVA tenderness Pulses 2+ Extremities: no clubbinbg cyanosis or edema, Homan's sign negative  Neurologic: grossly nonfocal; cranial nerves intact. Psychological: Normal affect and mood.  ECG not done today  LABS:  BMP Latest Ref Rng 04/13/2015 10/11/2013 05/19/2012  Glucose 65 - 99 mg/dL 109(H) 92 100(H)  BUN 6 - 20 mg/dL '13 16 17  ' Creatinine 0.61 - 1.24 mg/dL 1.23 1.2 1.3  Sodium 135 - 145 mmol/L 138 139 141  Potassium 3.5 - 5.1 mmol/L 4.5 4.7 4.5  Chloride 101 - 111 mmol/L 105 104 100  CO2 22 - 32 mmol/L '26 27 27  ' Calcium 8.9 - 10.3 mg/dL 9.8 9.1 9.6     Hepatic Function Latest Ref Rng 10/11/2013 05/19/2012 03/19/2011  Total Protein 6.0 - 8.3 g/dL 7.3 7.2 7.3  Albumin 3.5 - 5.2 g/dL 4.5 4.3 4.4  AST 0 - 37 U/L '23 22 23  ' ALT 0 - 53 U/L 32 30 32  Alk Phosphatase 39 - 117 U/L 85 101 75  Total Bilirubin 0.3 - 1.2 mg/dL 0.8 1.1 1.1  Bilirubin, Direct 0.0 - 0.3 mg/dL 0.1 0.1 0.1     CBC Latest Ref Rng 04/26/2015 04/25/2015  04/13/2015  WBC 4.0 - 10.5 K/uL 13.8(H) 9.0 5.3  Hemoglobin 13.0 - 17.0 g/dL 11.6(L) 12.6(L) 16.4  Hematocrit 39.0 - 52.0 % 34.8(L) 38.4(L) 48.0  Platelets 150 - 400 K/uL 98(L) 136(L) 162     Lipid Panel     Component Value Date/Time   CHOL 221* 10/11/2013 1022   TRIG 59.0 10/11/2013 1022   HDL 55.10 10/11/2013 1022   CHOLHDL 4 10/11/2013 1022   VLDL 11.8 10/11/2013 1022   LDLDIRECT 157.6 10/11/2013 1022     RADIOLOGY: No results found.    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  He has been on CPAP therapy since that time.  Recently, he has not been compliant with CPAP use.  I discussed the importance of treatment of his sleep apnea particularly with reference to cardiovascular risk.  He has been sleeping in a recliner since his hip surgery.  He has lost 25 pounds, which may improve his CPAP pressure requirements.  He will previously had been followed by [facility] for his DME company and has been transitioned to [facility].  His machine is old and he will qualify for new machine.  He will need new supplies.  I discussed the necessity for improved compliance.  If he gets a new machine  I will need to see him in 6 sleep clinic.  Otherwise, if he continues to use his present equipment  I will ask that a download be obtained in several months and I will see him in one year for reevaluation unless problems develop.  Time spent: 25 minutes   Troy Sine, MD, [facility]  09/07/2015 7:23 PM

## 2015-11-24 ENCOUNTER — Other Ambulatory Visit: Payer: Self-pay | Admitting: Neurosurgery

## 2015-12-12 ENCOUNTER — Encounter (HOSPITAL_COMMUNITY)
Admission: RE | Admit: 2015-12-12 | Discharge: 2015-12-12 | Disposition: A | Discharge status: Home / Self Care | Payer: Medicare Other | Source: Ambulatory Visit | Attending: Neurosurgery | Admitting: Neurosurgery

## 2015-12-12 ENCOUNTER — Encounter (HOSPITAL_COMMUNITY): Payer: Self-pay

## 2015-12-12 DIAGNOSIS — M4806 Spinal stenosis, lumbar region: Secondary | ICD-10-CM | POA: Insufficient documentation

## 2015-12-12 DIAGNOSIS — Z01812 Encounter for preprocedural laboratory examination: Secondary | ICD-10-CM | POA: Insufficient documentation

## 2015-12-12 DIAGNOSIS — Z0183 Encounter for blood typing: Secondary | ICD-10-CM | POA: Insufficient documentation

## 2015-12-12 HISTORY — DX: Paresthesia of skin: R20.0

## 2015-12-12 HISTORY — DX: Other specified postprocedural states: Z98.890

## 2015-12-12 HISTORY — DX: Other specified postprocedural states: R11.2

## 2015-12-12 HISTORY — DX: Paresthesia of skin: R20.2

## 2015-12-12 LAB — BASIC METABOLIC PANEL
ANION GAP: 9 (ref 5–15)
BUN: 17 mg/dL (ref 6–20)
CO2: 26 mmol/L (ref 22–32)
Calcium: 9.7 mg/dL (ref 8.9–10.3)
Chloride: 105 mmol/L (ref 101–111)
Creatinine, Ser: 1.37 mg/dL — ABNORMAL HIGH (ref 0.61–1.24)
GFR calc Af Amer: >60 mL/min (ref >60)
GFR, EST NON AFRICAN AMERICAN: 52 mL/min — AB (ref >60)
Glucose, Bld: 104 mg/dL — ABNORMAL HIGH (ref 65–99)
POTASSIUM: 4.2 mmol/L (ref 3.5–5.1)
SODIUM: 140 mmol/L (ref 135–145)

## 2015-12-12 LAB — SURGICAL PCR SCREEN
MRSA, PCR: NEGATIVE
Staphylococcus aureus: NEGATIVE

## 2015-12-12 LAB — CBC
HCT: 46.8 % (ref 39.0–52.0)
HEMOGLOBIN: 15.4 g/dL (ref 13.0–17.0)
MCH: 28.6 pg (ref 26.0–34.0)
MCHC: 32.9 g/dL (ref 30.0–36.0)
MCV: 86.8 fL (ref 78.0–100.0)
PLATELETS: 176 10*3/uL (ref 150–400)
RBC: 5.39 MIL/uL (ref 4.22–5.81)
RDW: 13.4 % (ref 11.5–15.5)
WBC: 4.3 10*3/uL (ref 4.0–10.5)

## 2015-12-12 LAB — TYPE AND SCREEN
ABO/RH(D): O POS
Antibody Screen: NEGATIVE

## 2015-12-12 NOTE — Progress Notes (Signed)
PCP - Dr. Precious Haws Cardiologist - Dr. Claiborne Billings - for sleep study only  EKG - 04/13/15 CXR - 04/13/15  Echo/Stress test/cardiac cath - denies  Patient denies chest pain and shortness of breath at PAT appointment.    Patient states that he has been diagnosed with sleep apnea but he does not wear his CPAP at this time.

## 2015-12-12 NOTE — Pre-Procedure Instructions (Signed)
    James Gates  12/12/2015      [id number] - [name], [name] [name] [address] Phone: [phone] Fax: [phone]    Your procedure is scheduled on Wednesday, April 26th, 2017.  Report to [facility] Admitting at 6:30 A.M.   Call this number if you have problems the morning of surgery:  [phone]   Remember:  Do not eat food or drink liquids after midnight.   Take these medicines the morning of surgery with A SIP OF WATER: None.  Stop taking: Aspirin, NSAIDS, Naproxen, Aleve, Ibuprofen, Advil, Motrin, BC's, Goody's, Fish oil, all herbal medications, and all vitamins.    Do not wear jewelry.  Do not wear lotions, powders, or colognes.  You may NOT wear deodorant.  Men may shave face and neck.  Do not bring valuables to the hospital.   [facility] is not responsible for any belongings or valuables.  Contacts, dentures or bridgework may not be worn into surgery.  Leave your suitcase in the car.  After surgery it may be brought to your room.  For patients admitted to the hospital, discharge time will be determined by your treatment team.  Patients discharged the day of surgery will not be allowed to drive home.   Special instructions:  See attached.   Please read over the following fact sheets that you were given. Pain Booklet, Coughing and Deep Breathing, Blood Transfusion Information, MRSA Information and Surgical Site Infection Prevention

## 2015-12-19 MED ORDER — CEFAZOLIN SODIUM-DEXTROSE 2-4 GM/100ML-% IV SOLN
2.0000 g | INTRAVENOUS | Status: AC
  Administered 2015-12-20: 2 g via INTRAVENOUS
  Administered 2015-12-20: 1 g via INTRAVENOUS
  Filled 2015-12-19: qty 100

## 2015-12-20 ENCOUNTER — Encounter (HOSPITAL_COMMUNITY)
Admission: RE | Disposition: A | Discharge status: Home / Self Care | Payer: Self-pay | Source: Ambulatory Visit | Attending: Neurosurgery

## 2015-12-20 ENCOUNTER — Ambulatory Visit (HOSPITAL_COMMUNITY): Payer: Medicare Other | Admitting: Anesthesiology

## 2015-12-20 ENCOUNTER — Encounter (HOSPITAL_COMMUNITY): Payer: Self-pay | Admitting: Anesthesiology

## 2015-12-20 ENCOUNTER — Observation Stay (HOSPITAL_COMMUNITY)
Admission: RE | Admit: 2015-12-20 | Discharge: 2015-12-21 | Disposition: A | Discharge status: Home / Self Care | Payer: Medicare Other | Source: Ambulatory Visit | Attending: Neurosurgery | Admitting: Neurosurgery

## 2015-12-20 ENCOUNTER — Ambulatory Visit (HOSPITAL_COMMUNITY): Payer: Medicare Other

## 2015-12-20 DIAGNOSIS — E669 Obesity, unspecified: Secondary | ICD-10-CM | POA: Diagnosis not present

## 2015-12-20 DIAGNOSIS — Z7982 Long term (current) use of aspirin: Secondary | ICD-10-CM | POA: Diagnosis not present

## 2015-12-20 DIAGNOSIS — Z419 Encounter for procedure for purposes other than remedying health state, unspecified: Secondary | ICD-10-CM

## 2015-12-20 DIAGNOSIS — R110 Nausea: Secondary | ICD-10-CM | POA: Insufficient documentation

## 2015-12-20 DIAGNOSIS — M5136 Other intervertebral disc degeneration, lumbar region: Secondary | ICD-10-CM | POA: Diagnosis not present

## 2015-12-20 DIAGNOSIS — Z87891 Personal history of nicotine dependence: Secondary | ICD-10-CM | POA: Insufficient documentation

## 2015-12-20 DIAGNOSIS — R338 Other retention of urine: Secondary | ICD-10-CM | POA: Insufficient documentation

## 2015-12-20 DIAGNOSIS — Z791 Long term (current) use of non-steroidal anti-inflammatories (NSAID): Secondary | ICD-10-CM | POA: Diagnosis not present

## 2015-12-20 DIAGNOSIS — Z96641 Presence of right artificial hip joint: Secondary | ICD-10-CM | POA: Diagnosis not present

## 2015-12-20 DIAGNOSIS — M47816 Spondylosis without myelopathy or radiculopathy, lumbar region: Secondary | ICD-10-CM | POA: Insufficient documentation

## 2015-12-20 DIAGNOSIS — M4806 Spinal stenosis, lumbar region: Secondary | ICD-10-CM | POA: Diagnosis present

## 2015-12-20 DIAGNOSIS — M48062 Spinal stenosis, lumbar region with neurogenic claudication: Secondary | ICD-10-CM | POA: Diagnosis present

## 2015-12-20 DIAGNOSIS — N401 Enlarged prostate with lower urinary tract symptoms: Secondary | ICD-10-CM | POA: Insufficient documentation

## 2015-12-20 HISTORY — PX: LUMBAR LAMINECTOMY/DECOMPRESSION MICRODISCECTOMY: SHX5026

## 2015-12-20 SURGERY — LUMBAR LAMINECTOMY/DECOMPRESSION MICRODISCECTOMY 3 LEVELS
Anesthesia: General | Site: Back

## 2015-12-20 MED ORDER — NEOSTIGMINE METHYLSULFATE 10 MG/10ML IV SOLN
INTRAVENOUS | Status: DC | PRN
  Administered 2015-12-20: 3 mg via INTRAVENOUS

## 2015-12-20 MED ORDER — ARTIFICIAL TEARS OP OINT
TOPICAL_OINTMENT | OPHTHALMIC | Status: AC
  Filled 2015-12-20: qty 3.5

## 2015-12-20 MED ORDER — MAGNESIUM HYDROXIDE 400 MG/5ML PO SUSP: 30.0000 mL | Freq: Every day | ORAL | Status: DC | PRN

## 2015-12-20 MED ORDER — ROCURONIUM BROMIDE 50 MG/5ML IV SOLN
INTRAVENOUS | Status: AC
  Filled 2015-12-20: qty 1

## 2015-12-20 MED ORDER — KETOROLAC TROMETHAMINE 30 MG/ML IJ SOLN
INTRAMUSCULAR | Status: AC
  Filled 2015-12-20: qty 1

## 2015-12-20 MED ORDER — PROPOFOL 10 MG/ML IV BOLUS
INTRAVENOUS | Status: DC | PRN
  Administered 2015-12-20: 150 mg via INTRAVENOUS

## 2015-12-20 MED ORDER — KCL IN DEXTROSE-NACL 20-5-0.45 MEQ/L-%-% IV SOLN: INTRAVENOUS | Status: DC

## 2015-12-20 MED ORDER — SODIUM CHLORIDE 0.9% FLUSH
3.0000 mL | Freq: Two times a day (BID) | INTRAVENOUS | Status: DC
  Administered 2015-12-20 – 2015-12-21 (×2): 3 mL via INTRAVENOUS

## 2015-12-20 MED ORDER — 0.9 % SODIUM CHLORIDE (POUR BTL) OPTIME
TOPICAL | Status: DC | PRN
  Administered 2015-12-20 (×2): 1000 mL

## 2015-12-20 MED ORDER — SUFENTANIL CITRATE 50 MCG/ML IV SOLN
INTRAVENOUS | Status: DC | PRN
  Administered 2015-12-20: 5 ug via INTRAVENOUS
  Administered 2015-12-20 (×2): 15 ug via INTRAVENOUS
  Administered 2015-12-20: 10 ug via INTRAVENOUS
  Administered 2015-12-20: 5 ug via INTRAVENOUS

## 2015-12-20 MED ORDER — BISACODYL 10 MG RE SUPP: 10.0000 mg | Freq: Every day | RECTAL | Status: DC | PRN

## 2015-12-20 MED ORDER — LIDOCAINE HCL (CARDIAC) 20 MG/ML IV SOLN
INTRAVENOUS | Status: AC
  Filled 2015-12-20: qty 10

## 2015-12-20 MED ORDER — THROMBIN 20000 UNITS EX SOLR
CUTANEOUS | Status: DC | PRN
  Administered 2015-12-20: 10:00:00 via TOPICAL

## 2015-12-20 MED ORDER — DEXAMETHASONE SODIUM PHOSPHATE 10 MG/ML IJ SOLN
INTRAMUSCULAR | Status: DC | PRN
  Administered 2015-12-20: 4 mg via INTRAVENOUS

## 2015-12-20 MED ORDER — KETOROLAC TROMETHAMINE 30 MG/ML IJ SOLN
30.0000 mg | Freq: Once | INTRAMUSCULAR | Status: AC
  Administered 2015-12-20: 30 mg via INTRAVENOUS

## 2015-12-20 MED ORDER — ARTIFICIAL TEARS OP OINT
TOPICAL_OINTMENT | OPHTHALMIC | Status: DC | PRN
  Administered 2015-12-20: 1 via OPHTHALMIC

## 2015-12-20 MED ORDER — ONDANSETRON HCL 4 MG/2ML IJ SOLN
INTRAMUSCULAR | Status: AC
  Filled 2015-12-20: qty 4

## 2015-12-20 MED ORDER — MIDAZOLAM HCL 2 MG/2ML IJ SOLN
INTRAMUSCULAR | Status: AC
  Filled 2015-12-20: qty 2

## 2015-12-20 MED ORDER — OXYCODONE-ACETAMINOPHEN 5-325 MG PO TABS
1.0000 | ORAL_TABLET | ORAL | Status: DC | PRN
  Administered 2015-12-20: 1 via ORAL
  Filled 2015-12-20: qty 1

## 2015-12-20 MED ORDER — PHENOL 1.4 % MT LIQD: 1.0000 | OROMUCOSAL | Status: DC | PRN

## 2015-12-20 MED ORDER — ACETAMINOPHEN 10 MG/ML IV SOLN
INTRAVENOUS | Status: AC
  Administered 2015-12-20: 1000 mg via INTRAVENOUS
  Filled 2015-12-20: qty 100

## 2015-12-20 MED ORDER — LIDOCAINE HCL (CARDIAC) 20 MG/ML IV SOLN
INTRAVENOUS | Status: DC | PRN
  Administered 2015-12-20: 100 mg via INTRAVENOUS

## 2015-12-20 MED ORDER — BUPIVACAINE HCL (PF) 0.5 % IJ SOLN
INTRAMUSCULAR | Status: DC | PRN
  Administered 2015-12-20: 20 mL

## 2015-12-20 MED ORDER — ONDANSETRON HCL 4 MG/2ML IJ SOLN
INTRAMUSCULAR | Status: AC
  Filled 2015-12-20: qty 2

## 2015-12-20 MED ORDER — ONDANSETRON HCL 4 MG PO TABS: 4.0000 mg | ORAL_TABLET | Freq: Four times a day (QID) | ORAL | Status: DC | PRN

## 2015-12-20 MED ORDER — SODIUM CHLORIDE 0.9 % IJ SOLN
INTRAMUSCULAR | Status: AC
  Filled 2015-12-20: qty 10

## 2015-12-20 MED ORDER — SODIUM CHLORIDE 0.9 % IV SOLN: 250.0000 mL | INTRAVENOUS | Status: DC

## 2015-12-20 MED ORDER — KETOROLAC TROMETHAMINE 30 MG/ML IJ SOLN
30.0000 mg | Freq: Four times a day (QID) | INTRAMUSCULAR | Status: DC
  Administered 2015-12-20 – 2015-12-21 (×5): 30 mg via INTRAVENOUS
  Filled 2015-12-20 (×5): qty 1

## 2015-12-20 MED ORDER — CYCLOBENZAPRINE HCL 10 MG PO TABS: 10.0000 mg | ORAL_TABLET | Freq: Three times a day (TID) | ORAL | Status: DC | PRN

## 2015-12-20 MED ORDER — PHENYLEPHRINE 40 MCG/ML (10ML) SYRINGE FOR IV PUSH (FOR BLOOD PRESSURE SUPPORT)
PREFILLED_SYRINGE | INTRAVENOUS | Status: AC
  Filled 2015-12-20: qty 10

## 2015-12-20 MED ORDER — ALBUMIN HUMAN 5 % IV SOLN
INTRAVENOUS | Status: DC | PRN
  Administered 2015-12-20: 11:00:00 via INTRAVENOUS

## 2015-12-20 MED ORDER — ROCURONIUM BROMIDE 100 MG/10ML IV SOLN
INTRAVENOUS | Status: DC | PRN
  Administered 2015-12-20: 50 mg via INTRAVENOUS
  Administered 2015-12-20 (×2): 20 mg via INTRAVENOUS

## 2015-12-20 MED ORDER — HYDROXYZINE HCL 25 MG PO TABS: 50.0000 mg | ORAL_TABLET | ORAL | Status: DC | PRN

## 2015-12-20 MED ORDER — SODIUM CHLORIDE 0.9 % IR SOLN
Status: DC | PRN
  Administered 2015-12-20: 10:00:00

## 2015-12-20 MED ORDER — LACTATED RINGERS IV SOLN
INTRAVENOUS | Status: DC | PRN
  Administered 2015-12-20 (×2): via INTRAVENOUS

## 2015-12-20 MED ORDER — GLYCOPYRROLATE 0.2 MG/ML IJ SOLN
INTRAMUSCULAR | Status: DC | PRN
  Administered 2015-12-20: 0.4 mg via INTRAVENOUS

## 2015-12-20 MED ORDER — MIDAZOLAM HCL 5 MG/5ML IJ SOLN
INTRAMUSCULAR | Status: DC | PRN
  Administered 2015-12-20: 1 mg via INTRAVENOUS

## 2015-12-20 MED ORDER — SUCCINYLCHOLINE CHLORIDE 20 MG/ML IJ SOLN
INTRAMUSCULAR | Status: AC
  Filled 2015-12-20: qty 1

## 2015-12-20 MED ORDER — THROMBIN 5000 UNITS EX SOLR
OROMUCOSAL | Status: DC | PRN
  Administered 2015-12-20: 10:00:00 via TOPICAL

## 2015-12-20 MED ORDER — ONDANSETRON HCL 4 MG/2ML IJ SOLN
INTRAMUSCULAR | Status: DC | PRN
  Administered 2015-12-20: 4 mg via INTRAVENOUS

## 2015-12-20 MED ORDER — FENTANYL CITRATE (PF) 250 MCG/5ML IJ SOLN
INTRAMUSCULAR | Status: AC
  Filled 2015-12-20: qty 5

## 2015-12-20 MED ORDER — HYDROMORPHONE HCL 1 MG/ML IJ SOLN
INTRAMUSCULAR | Status: AC
Start: 2015-12-20 — End: 2015-12-21
  Filled 2015-12-20: qty 1

## 2015-12-20 MED ORDER — SODIUM CHLORIDE 0.9% FLUSH: 3.0000 mL | INTRAVENOUS | Status: DC | PRN

## 2015-12-20 MED ORDER — EPHEDRINE SULFATE 50 MG/ML IJ SOLN
INTRAMUSCULAR | Status: AC
  Filled 2015-12-20: qty 1

## 2015-12-20 MED ORDER — ALUM & MAG HYDROXIDE-SIMETH 200-200-20 MG/5ML PO SUSP
30.0000 mL | Freq: Four times a day (QID) | ORAL | Status: DC | PRN
Start: 2015-12-20 — End: 2015-12-22

## 2015-12-20 MED ORDER — HYDROCODONE-ACETAMINOPHEN 5-325 MG PO TABS: 1.0000 | ORAL_TABLET | ORAL | Status: DC | PRN

## 2015-12-20 MED ORDER — MORPHINE SULFATE (PF) 4 MG/ML IV SOLN: 4.0000 mg | INTRAVENOUS | Status: DC | PRN

## 2015-12-20 MED ORDER — HYDROXYZINE HCL 50 MG/ML IM SOLN
50.0000 mg | INTRAMUSCULAR | Status: DC | PRN
  Administered 2015-12-20: 50 mg via INTRAMUSCULAR
  Filled 2015-12-20: qty 1

## 2015-12-20 MED ORDER — HYDROMORPHONE HCL 1 MG/ML IJ SOLN
0.2500 mg | INTRAMUSCULAR | Status: DC | PRN
  Administered 2015-12-20 (×2): 0.25 mg via INTRAVENOUS

## 2015-12-20 MED ORDER — GLYCOPYRROLATE 0.2 MG/ML IJ SOLN
INTRAMUSCULAR | Status: AC
  Filled 2015-12-20: qty 2

## 2015-12-20 MED ORDER — SUFENTANIL CITRATE 50 MCG/ML IV SOLN
INTRAVENOUS | Status: AC
  Filled 2015-12-20: qty 1

## 2015-12-20 MED ORDER — ZOLPIDEM TARTRATE 5 MG PO TABS: 5.0000 mg | ORAL_TABLET | Freq: Every evening | ORAL | Status: DC | PRN

## 2015-12-20 MED ORDER — PROCHLORPERAZINE EDISYLATE 5 MG/ML IJ SOLN
10.0000 mg | Freq: Once | INTRAMUSCULAR | Status: AC | PRN
  Administered 2015-12-20: 10 mg via INTRAVENOUS
  Filled 2015-12-20: qty 2

## 2015-12-20 MED ORDER — FENTANYL CITRATE (PF) 100 MCG/2ML IJ SOLN
INTRAMUSCULAR | Status: DC | PRN
Start: 2015-12-20 — End: 2015-12-20
  Administered 2015-12-20 (×2): 50 ug via INTRAVENOUS

## 2015-12-20 MED ORDER — ACETAMINOPHEN 325 MG PO TABS: 650.0000 mg | ORAL_TABLET | ORAL | Status: DC | PRN

## 2015-12-20 MED ORDER — PHENYLEPHRINE HCL 10 MG/ML IJ SOLN
10.0000 mg | INTRAVENOUS | Status: DC | PRN
  Administered 2015-12-20: 40 ug/min via INTRAVENOUS

## 2015-12-20 MED ORDER — NEOSTIGMINE METHYLSULFATE 10 MG/10ML IV SOLN
INTRAVENOUS | Status: AC
  Filled 2015-12-20: qty 1

## 2015-12-20 MED ORDER — PROPOFOL 10 MG/ML IV BOLUS
INTRAVENOUS | Status: AC
  Filled 2015-12-20: qty 20

## 2015-12-20 MED ORDER — LIDOCAINE-EPINEPHRINE 1 %-1:100000 IJ SOLN
INTRAMUSCULAR | Status: DC | PRN
  Administered 2015-12-20: 20 mL

## 2015-12-20 MED ORDER — ONDANSETRON HCL 4 MG/2ML IJ SOLN
4.0000 mg | Freq: Four times a day (QID) | INTRAMUSCULAR | Status: DC | PRN
  Administered 2015-12-20: 8 mg via INTRAVENOUS

## 2015-12-20 MED ORDER — ACETAMINOPHEN 650 MG RE SUPP: 650.0000 mg | RECTAL | Status: DC | PRN

## 2015-12-20 MED ORDER — DEXAMETHASONE SODIUM PHOSPHATE 4 MG/ML IJ SOLN
INTRAMUSCULAR | Status: AC
  Filled 2015-12-20: qty 1

## 2015-12-20 MED ORDER — MENTHOL 3 MG MT LOZG: 1.0000 | LOZENGE | OROMUCOSAL | Status: DC | PRN

## 2015-12-20 SURGICAL SUPPLY — 64 items
ADH SKN CLS APL DERMABOND .7 (GAUZE/BANDAGES/DRESSINGS) ×2
APL SKNCLS STERI-STRIP NONHPOA (GAUZE/BANDAGES/DRESSINGS)
BAG DECANTER FOR FLEXI CONT (MISCELLANEOUS) ×2 IMPLANT
BENZOIN TINCTURE PRP APPL 2/3 (GAUZE/BANDAGES/DRESSINGS) IMPLANT
BLADE CLIPPER SURG (BLADE) IMPLANT
BRUSH SCRUB EZ PLAIN DRY (MISCELLANEOUS) ×2 IMPLANT
BUR ACORN 6.0 ACORN (BURR) IMPLANT
BUR ACRON 5.0MM COATED (BURR) ×1 IMPLANT
BUR MATCHSTICK NEURO 3.0 LAGG (BURR) ×2 IMPLANT
CANISTER SUCT 3000ML PPV (MISCELLANEOUS) ×2 IMPLANT
DERMABOND ADVANCED (GAUZE/BANDAGES/DRESSINGS) ×2
DERMABOND ADVANCED .7 DNX12 (GAUZE/BANDAGES/DRESSINGS) IMPLANT
DRAPE LAPAROTOMY 100X72X124 (DRAPES) ×2 IMPLANT
DRAPE MICROSCOPE LEICA (MISCELLANEOUS) ×1 IMPLANT
DRAPE POUCH INSTRU U-SHP 10X18 (DRAPES) ×2 IMPLANT
DRSG EMULSION OIL 3X3 NADH (GAUZE/BANDAGES/DRESSINGS) IMPLANT
ELECT REM PT RETURN 9FT ADLT (ELECTROSURGICAL) ×2
ELECTRODE REM PT RTRN 9FT ADLT (ELECTROSURGICAL) ×1 IMPLANT
GAUZE SPONGE 4X4 12PLY STRL (GAUZE/BANDAGES/DRESSINGS) ×1 IMPLANT
GAUZE SPONGE 4X4 16PLY XRAY LF (GAUZE/BANDAGES/DRESSINGS) IMPLANT
GLOVE BIOGEL PI IND STRL 8 (GLOVE) ×1 IMPLANT
GLOVE BIOGEL PI IND STRL 8.5 (GLOVE) IMPLANT
GLOVE BIOGEL PI INDICATOR 8 (GLOVE) ×4
GLOVE BIOGEL PI INDICATOR 8.5 (GLOVE) ×1
GLOVE ECLIPSE 7.5 STRL STRAW (GLOVE) ×6 IMPLANT
GLOVE ECLIPSE 8.5 STRL (GLOVE) ×1 IMPLANT
GLOVE EXAM NITRILE LRG STRL (GLOVE) IMPLANT
GLOVE EXAM NITRILE MD LF STRL (GLOVE) IMPLANT
GLOVE EXAM NITRILE XL STR (GLOVE) IMPLANT
GLOVE EXAM NITRILE XS STR PU (GLOVE) IMPLANT
GOWN STRL REUS W/ TWL LRG LVL3 (GOWN DISPOSABLE) ×1 IMPLANT
GOWN STRL REUS W/ TWL XL LVL3 (GOWN DISPOSABLE) IMPLANT
GOWN STRL REUS W/TWL 2XL LVL3 (GOWN DISPOSABLE) ×3 IMPLANT
GOWN STRL REUS W/TWL LRG LVL3 (GOWN DISPOSABLE)
GOWN STRL REUS W/TWL XL LVL3 (GOWN DISPOSABLE) ×2
HEMOSTAT POWDER KIT SURGIFOAM (HEMOSTASIS) ×1 IMPLANT
KIT BASIN OR (CUSTOM PROCEDURE TRAY) ×2 IMPLANT
KIT ROOM TURNOVER OR (KITS) ×2 IMPLANT
NDL HYPO 18GX1.5 BLUNT FILL (NEEDLE) IMPLANT
NDL SPNL 18GX3.5 QUINCKE PK (NEEDLE) ×1 IMPLANT
NDL SPNL 22GX3.5 QUINCKE BK (NEEDLE) ×1 IMPLANT
NEEDLE HYPO 18GX1.5 BLUNT FILL (NEEDLE) IMPLANT
NEEDLE SPNL 18GX3.5 QUINCKE PK (NEEDLE) ×4 IMPLANT
NEEDLE SPNL 22GX3.5 QUINCKE BK (NEEDLE) ×2 IMPLANT
NS IRRIG 1000ML POUR BTL (IV SOLUTION) ×3 IMPLANT
PACK LAMINECTOMY NEURO (CUSTOM PROCEDURE TRAY) ×2 IMPLANT
PAD ARMBOARD 7.5X6 YLW CONV (MISCELLANEOUS) ×6 IMPLANT
PATTIES SURGICAL .5 X1 (DISPOSABLE) ×2 IMPLANT
RUBBERBAND STERILE (MISCELLANEOUS) ×4 IMPLANT
SPONGE LAP 4X18 X RAY DECT (DISPOSABLE) IMPLANT
SPONGE NEURO XRAY DETECT 1X3 (DISPOSABLE) ×2 IMPLANT
SPONGE SURGIFOAM ABS GEL 100 (HEMOSTASIS) ×2 IMPLANT
STRIP CLOSURE SKIN 1/2X4 (GAUZE/BANDAGES/DRESSINGS) IMPLANT
SUT BONE WAX W31G (SUTURE) ×2 IMPLANT
SUT PROLENE 6 0 BV (SUTURE) IMPLANT
SUT VIC AB 1 CT1 18XBRD ANBCTR (SUTURE) ×1 IMPLANT
SUT VIC AB 1 CT1 8-18 (SUTURE) ×6
SUT VIC AB 2-0 CP2 18 (SUTURE) ×4 IMPLANT
SUT VIC AB 3-0 SH 8-18 (SUTURE) IMPLANT
SYR 5ML LL (SYRINGE) IMPLANT
TAPE CLOTH SURG 4X10 WHT LF (GAUZE/BANDAGES/DRESSINGS) ×1 IMPLANT
TOWEL OR 17X24 6PK STRL BLUE (TOWEL DISPOSABLE) ×2 IMPLANT
TOWEL OR 17X26 10 PK STRL BLUE (TOWEL DISPOSABLE) ×2 IMPLANT
WATER STERILE IRR 1000ML POUR (IV SOLUTION) ×2 IMPLANT

## 2015-12-20 NOTE — Progress Notes (Signed)
Filed Vitals:   12/20/15 1400 12/20/15 1410 12/20/15 1430 12/20/15 1748  BP: 126/55 107/54 138/58 123/54  Pulse: 76 71 83 85  Temp:  97.2 F (36.2 C) 98.4 F (36.9 C) 98.1 F (36.7 C)  TempSrc:      Resp: 14 13 13 18   SpO2: 95% 96% 100% 100%    Patient resting in bed. Had nausea following surgery, did not respond to Compazine or Zofran given by the anesthesia service, did respond to Vistaril IM. Dressing clean and dry. Patient has ambulated with a staff in the halls. Foley to straight drainage. Plan is to leave Foley in, and will discharge patient to home with a leg bag. He is to follow-up with Dr. Karsten Ro next week for further assessment and care regarding his chronic urinary retention.  Plan: Continued to progress through postoperative recovery. Encouraged to ambulate with a staff in the halls.  Hosie Spangle, MD 12/20/2015, 7:07 PM

## 2015-12-20 NOTE — Anesthesia Postprocedure Evaluation (Signed)
Anesthesia Post Note  Patient: James Gates  Procedure(s) Performed: Procedure(s) (LRB): Lumbar two-Lumbar five Decompressive Laminectomy (N/A)  Patient location during evaluation: PACU Anesthesia Type: General Level of consciousness: awake and alert Pain management: pain level controlled Vital Signs Assessment: post-procedure vital signs reviewed and stable Respiratory status: spontaneous breathing, nonlabored ventilation, respiratory function stable and patient connected to nasal cannula oxygen Cardiovascular status: blood pressure returned to baseline and stable Postop Assessment: no signs of nausea or vomiting Anesthetic complications: no    Last Vitals:  Filed Vitals:   12/20/15 1400 12/20/15 1410  BP: 126/55 107/54  Pulse: 76 71  Temp:  36.2 C  Resp: 14 13    Last Pain:  Filed Vitals:   12/20/15 1413  PainSc: Asleep                 Alroy Portela J

## 2015-12-20 NOTE — H&P (Signed)
Subjective: Patient is a 65 y.o. right-handed black male who is admitted for treatment of severe multilevel, multifactorial lumbar stenosis.  Patient was seen in consultation by me a few weeks ago, however he has been having difficulty with his low back for more than a decade. His primary physician Dr. Benay Pillow in October 2008 obtain an MRI scan that showed to give lumbar spinal stenosis. More recently he underwent workup with Dr. Phylliss Bob with an MRI scan a year ago, in May 2010, which showed marked multifactorial lumbar stenosis at L2-3 and L3-4 and severe multifactorial lumbar stenosis at L4-5, but most significantly a markedly dilated and distended bladder that was at least up to the level of the patient's umbilicus. He did undergo right hip replacement with Dr. Frederik Pear in August 2016, and underwent rehabilitation at [facility], and continues to use a single-point cane.  Symptomatically he continues to have increasing difficulties with his low back. He complains of pain and numbness in the low back that extends down into the buttocks, thighs, legs, and feet bilaterally. It is particularly brought on by walking as little as 10 feet. He describes numbness, tingling, and burning around the anus, as well as around the penis at times, and at times he feels the loss of anal sphincter control. He describes difficulty with his urinary flow and did consult with Dr. Kathie Rhodes, at [facility] urology 6-8 years ago, but did not subsequent follow-up. He denies any weakness in the lower extremities.  He is admitted now for a L2-L5 decompressive lumbar laminectomy.   Patient Active Problem List   Diagnosis Date Noted  . Primary osteoarthritis of right hip 04/23/2015  . Enlarged prostate 12/14/2014  . Obstructive apnea 12/14/2014  . Tinea cruris 01/24/2011  . DERMATITIS, SEBORRHEIC NEC 05/21/2007  . ACTINIC KERATOSIS, HEAD 05/21/2007  . SPINAL STENOSIS, LUMBAR 05/21/2007  . COLONIC POLYPS, HX OF  05/21/2007  . HYPERLIPIDEMIA 05/14/2007   Past Medical History  Diagnosis Date  . Hyperlipidemia   . Colon polyps   . Spinal stenosis of lumbar region   . GERD (gastroesophageal reflux disease)   . Arthritis   . History of BPH   . BPH (benign prostatic hypertrophy) with urinary retention   . Wears partial dentures   . PONV (postoperative nausea and vomiting)   . Sleep apnea, obstructive 2011    sleep study done @ [facility]; does not wear CPAP at this moment  . Numbness and tingling of both legs     Past Surgical History  Procedure Laterality Date  . Colonoscopy w/ polypectomy    . Total hip arthroplasty Right 04/24/2015    Procedure: TOTAL HIP ARTHROPLASTY ANTERIOR APPROACH;  Surgeon: Frederik Pear, MD;  Location: [facility];  Service: Orthopedics;  Laterality: Right;  . Cyst excision      on back    Prescriptions prior to admission  Medication Sig Dispense Refill Last Dose  . aspirin 81 MG tablet Take 81 mg by mouth daily.   Past Month at Unknown time  . naproxen sodium (ANAPROX) 220 MG tablet Take 220 mg by mouth 2 (two) times daily as needed (pain).   More than a month at Unknown time   No Known Allergies  Social History  Substance Use Topics  . Smoking status: Former Research scientist (life sciences)  . Smokeless tobacco: Never Used     Comment: quit smoking 1983  . Alcohol Use: Not on file     Comment: weekly    Family History  Problem  Relation Age of Onset  . Ulcers    . Lung cancer Father      Review of Systems Pertinent items noted in HPI and remainder of comprehensive ROS otherwise negative.  Objective: Vital signs in last 24 hours: Temp:  [98.4 F (36.9 C)] 98.4 F (36.9 C) (04/26 0702) Pulse Rate:  [88] 88 (04/26 0702) Resp:  [18] 18 (04/26 0702) BP: (128)/(67) 128/67 mmHg (04/26 0702) SpO2:  [98 %] 98 % (04/26 0702)  EXAM: Patient is a well-developed, well-nourished black male in no acute distress. Lungs are clear to auscultation , the patient has symmetrical respiratory  excursion. Heart has a regular rate and rhythm normal S1 and S2 no murmur.   Abdomen is soft nontender nondistended bowel sounds are present. Extremity examination shows no clubbing cyanosis or edema. Motor examination shows 5 over 5 strength in the lower extremities including the iliopsoas quadriceps dorsiflexor extensor hallicus  longus and plantar flexor bilaterally. Sensation is intact to pinprick in the distal lower extremities. Reflexes are symmetrical bilaterally. No pathologic reflexes are present. Gait and stance are mildly unsteady. He does use a single-point cane.  Data Review:CBC    Component Value Date/Time   WBC 4.3 12/12/2015 1042   RBC 5.39 12/12/2015 1042   HGB 15.4 12/12/2015 1042   HCT 46.8 12/12/2015 1042   PLT 176 12/12/2015 1042   MCV 86.8 12/12/2015 1042   MCH 28.6 12/12/2015 1042   MCHC 32.9 12/12/2015 1042   RDW 13.4 12/12/2015 1042   LYMPHSABS 1.9 04/13/2015 1125   MONOABS 0.5 04/13/2015 1125   EOSABS 0.3 04/13/2015 1125   BASOSABS 0.0 04/13/2015 1125                          BMET    Component Value Date/Time   NA 140 12/12/2015 1042   K 4.2 12/12/2015 1042   CL 105 12/12/2015 1042   CO2 26 12/12/2015 1042   GLUCOSE 104* 12/12/2015 1042   BUN 17 12/12/2015 1042   CREATININE 1.37* 12/12/2015 1042   CALCIUM 9.7 12/12/2015 1042   GFRNONAA 52* 12/12/2015 1042   GFRAA >60 12/12/2015 1042    Assessment/Plan: Patient with pronounced neurogenic claudication secondary to severe multilevel, multifactorial lumbar stenosis. Patient has had long-term bladder dysfunction and urinary retention. He is admitted now for L2-L5 decompressive lumbar laminectomy.  I've discussed with the patient the nature of his condition, the nature the surgical procedure, the typical length of surgery, hospital stay, and overall recuperation. We discussed limitations postoperatively. I discussed risks of surgery including risks of infection, bleeding, possibly need for transfusion, the  risk of nerve root dysfunction with pain, weakness, numbness, or paresthesias, or risk of dural tear and CSF leakage and possible need for further surgery, and the risk of anesthetic complications including myocardial infarction, stroke, pneumonia, and death. Understanding all this the patient does wish to proceed with surgery and is admitted for such.  I've discussed his situation with Dr. Kathie Rhodes, who recommended that we place a Foley catheter at the time of surgery, leave it in postoperatively, and discharge the patient home with the catheter in place, with the patient to follow up with him in the office early next week.  Hosie Spangle, MD 12/20/2015 8:14 AM

## 2015-12-20 NOTE — Transfer of Care (Signed)
Immediate Anesthesia Transfer of Care Note  Patient: James Gates  Procedure(s) Performed: Procedure(s): Lumbar two-Lumbar five Decompressive Laminectomy (N/A)  Patient Location: PACU  Anesthesia Type:General  Level of Consciousness: awake, alert , oriented and patient cooperative  Airway & Oxygen Therapy: Patient Spontanous Breathing and Patient connected to nasal cannula oxygen  Post-op Assessment: Report given to RN, Post -op Vital signs reviewed and stable and Patient moving all extremities  Post vital signs: Reviewed and stable  Last Vitals:  Filed Vitals:   12/20/15 0702  BP: 128/67  Pulse: 88  Temp: 36.9 C  Resp: 18    Last Pain:  Filed Vitals:   12/20/15 0706  PainSc: 5          Complications: No apparent anesthesia complications

## 2015-12-20 NOTE — Op Note (Signed)
12/20/2015  12:36 PM  PATIENT:  James Gates  65 y.o. male  PRE-OPERATIVE DIAGNOSIS:  Multilevel, multifactorial lumbar stenosis with neurogenic claudication, lumbar spondylosis, lumbar degenerative disc disease, chronic urinary retention  POST-OPERATIVE DIAGNOSIS:  Multilevel, multifactorial lumbar stenosis with neurogenic claudication, lumbar spondylosis, lumbar degenerative disc disease, chronic urinary retention  PROCEDURE:  Procedure(s):  L2-L5 decompressive lumbar laminectomy  SURGEON:  Surgeon(s): Jovita Gamma, MD Kristeen Miss, MD  ASSISTANTS: Kristeen Miss, M.D.  ANESTHESIA:   general  EBL:  Total I/O In: 1800 [I.V.:1300; IV Piggyback:500] Out: 3075 [Urine:2525; Blood:550]  BLOOD ADMINISTERED:none  COUNT: Correct per nursing staff  DICTATION: Patient was brought to the operating room placed under general endotracheal anesthesia. A Foley catheter was placed, and drained 1700 mL of urine. This was allowed to drain intermittently over about 45 minutes, with allowing the catheter to be clamped for 10 minutes or so between drainages, until the bladder was fully empty. Then through the case the patient made an additional 825 mL of urine. Patient was turned to a prone position the lumbar region was prepped with Betadine soap and solution and draped in a sterile fashion. The midline was infiltrated with local anesthetic with epinephrine. A midline incision was made carried down thru the subcutaneous tissue, bipolar cautery and electrocautery were used to maintain hemostasis. Dissection was carried down to the lumbar fascia which was incised bilaterally and the paraspinal muscles were dissected from the spinous process and lamina in a subperiosteal fashion. A localizing x-ray was taken and the L2, L3, L4, and L5  levels were identified. Laminectomy was begun with double-action rongeurs a high-speed drill and Kerrison punches. The thickened ligamentum flavum was carefully removed.  Dissection was carried laterally, performing medial facetectomies bilaterally, to decompress the lateral stenosis, taking care to leave the facet complexes intact.  Foraminotomies were performed for the exiting L2, L3, L4, and L5 nerve roots bilaterally. Once the decompression was completed hemostasis was established with the use of bipolar cautery, Gelfoam with thrombin, and Surgifoam. Paraspinal muscles, deep fascia, and Scarpa's fascia were closed in separate layers with interrupted 1 undyed Vicryl sutures. The subcutaneous and subcuticular were closed with interrupted inverted 2-0 undyed Vicryl sutures. Skin edges were approximated with Dermabond.  A dressing of sterile gauze and Hypafix was applied. Following surgery the patient was turned back to supine position, to be reversed an anesthetic, extubated, and transferred to the recovery room for further care.    PLAN OF CARE: Admit for overnight observation  PATIENT DISPOSITION:  PACU - hemodynamically stable.   Delay start of Pharmacological VTE agent (>24hrs) due to surgical blood loss or risk of bleeding:  yes

## 2015-12-20 NOTE — Anesthesia Procedure Notes (Signed)
Procedure Name: Intubation Date/Time: 12/20/2015 8:28 AM Performed by: Izora Gala Pre-anesthesia Checklist: Patient identified, Emergency Drugs available, Suction available and Patient being monitored Patient Re-evaluated:Patient Re-evaluated prior to inductionOxygen Delivery Method: Circle system utilized Preoxygenation: Pre-oxygenation with 100% oxygen Intubation Type: IV induction Ventilation: Mask ventilation without difficulty and Oral airway inserted - appropriate to patient size Laryngoscope Size: Miller and 3 Grade View: Grade I Tube type: Oral Tube size: 7.5 mm Number of attempts: 1 Airway Equipment and Method: Stylet,  LTA kit utilized and Bite block Placement Confirmation: ETT inserted through vocal cords under direct vision,  positive ETCO2 and breath sounds checked- equal and bilateral Secured at: 21 cm Tube secured with: Tape

## 2015-12-20 NOTE — Anesthesia Preprocedure Evaluation (Addendum)
Anesthesia Evaluation  Patient identified by MRN, date of birth, ID band Patient awake    Reviewed: Allergy & Precautions, NPO status , Patient's Chart, lab work & pertinent test results  History of Anesthesia Complications (+) PONV and history of anesthetic complications  Airway Mallampati: II  TM Distance: >3 FB Neck ROM: Full    Dental no notable dental hx.    Pulmonary sleep apnea , former smoker,    Pulmonary exam normal breath sounds clear to auscultation       Cardiovascular negative cardio ROS Normal cardiovascular exam Rhythm:Regular Rate:Normal  ECG normal   Neuro/Psych negative neurological ROS  negative psych ROS   GI/Hepatic Neg liver ROS, GERD  ,  Endo/Other  negative endocrine ROS  Renal/GU negative Renal ROSCr 1.37 k 4.2   negative genitourinary   Musculoskeletal  (+) Arthritis ,   Abdominal (+) + obese,   Peds negative pediatric ROS (+)  Hematology negative hematology ROS (+)   Anesthesia Other Findings   Reproductive/Obstetrics negative OB ROS                           Anesthesia Physical Anesthesia Plan  ASA: II  Anesthesia Plan: General   Post-op Pain Management:    Induction: Intravenous  Airway Management Planned: Oral ETT  Additional Equipment:   Intra-op Plan:   Post-operative Plan: Extubation in OR  Informed Consent: I have reviewed the patients History and Physical, chart, labs and discussed the procedure including the risks, benefits and alternatives for the proposed anesthesia with the patient or authorized representative who has indicated his/her understanding and acceptance.   Dental advisory given  Plan Discussed with: CRNA  Anesthesia Plan Comments:         Anesthesia Quick Evaluation

## 2015-12-21 ENCOUNTER — Encounter (HOSPITAL_COMMUNITY): Payer: Self-pay | Admitting: Neurosurgery

## 2015-12-21 DIAGNOSIS — M4806 Spinal stenosis, lumbar region: Secondary | ICD-10-CM | POA: Diagnosis not present

## 2015-12-21 MED ORDER — HYDROCODONE-ACETAMINOPHEN 5-325 MG PO TABS: 1.0000 | ORAL_TABLET | ORAL | Status: DC | PRN

## 2015-12-21 NOTE — Discharge Instructions (Signed)

## 2015-12-21 NOTE — Progress Notes (Signed)
Discharge instructions/education/Rx given to patient with wife at bedside and they both verbalized understanding. No c/o noted. No redness, no swelling and no drainage noted on incision sit. Pain is low to moderate per patient. D/C via wheelchair.

## 2015-12-21 NOTE — Discharge Summary (Signed)
Physician Discharge Summary  Patient ID: James Gates MRN: [id number] DOB/AGE: [birth year] 65 y.o.  Admit date: 12/20/2015 Discharge date: 12/21/2015  Admission Diagnoses:  Multilevel, multifactorial lumbar stenosis with neurogenic claudication, lumbar spondylosis, lumbar degenerative disc disease, chronic urinary retention  Discharge Diagnoses:  Multilevel, multifactorial lumbar stenosis with neurogenic claudication, lumbar spondylosis, lumbar degenerative disc disease, chronic urinary retention Active Problems:   Lumbar stenosis with neurogenic claudication   Discharged Condition: good  Hospital Course:  Patient was admitted, underwent an L2-L5 decompressive lumbar laminectomy. Postoperatively he has done well. With excellent relief of his disabling neurogenic claudication. He has ambulate actively in the halls. His dressing was removed, and his wound is been left open to air. It is healing nicely. He is scheduled to follow-up with me in about 3 weeks. He is to let me know if anything feels the meantime  Preoperatively he had documented chronic urinary retention. I consulted with Dr. Kathie Rhodes, whom the patient previously seen, preoperatively and he recommended that we place a Foley catheter the time surgery, leave the catheter in, and discharge the patient to home with a leg bag, with the patient to follow up with him in the office early next week. Patient has been instruction in the care of his Foley catheter, and the use of his leg bag by the nursing staff.  Discharge Exam: Blood pressure 113/65, pulse 99, temperature 99.1 F (37.3 C), temperature source Oral, resp. rate 18, SpO2 95 %.  Disposition:  home    Medication List    TAKE these medications        aspirin 81 MG tablet  Take 81 mg by mouth daily.     HYDROcodone-acetaminophen 5-325 MG tablet  Commonly known as:  NORCO/VICODIN  Take 1-2 tablets by mouth every 4 (four) hours as needed (mild pain).     naproxen  sodium 220 MG tablet  Commonly known as:  ANAPROX  Take 220 mg by mouth 2 (two) times daily as needed (pain).         SignedHosie Spangle 12/21/2015, 5:12 PM

## 2016-05-22 IMAGING — RF DG HIP (WITH PELVIS) OPERATIVE*R*
1 series · 2 of 2 positions shown · non-contrast
Comparison: None.

CLINICAL DATA: Right hip arthroplasty.

EXAM:
OPERATIVE RIGHT HIP (WITH PELVIS IF PERFORMED) FLUOROSCOPIC VIEWS
TECHNIQUE: Fluoroscopic spot image(s) were submitted for interpretation
post-operatively.

[Series 1: run · 2 of 2 slices shown]
[im 1/2]
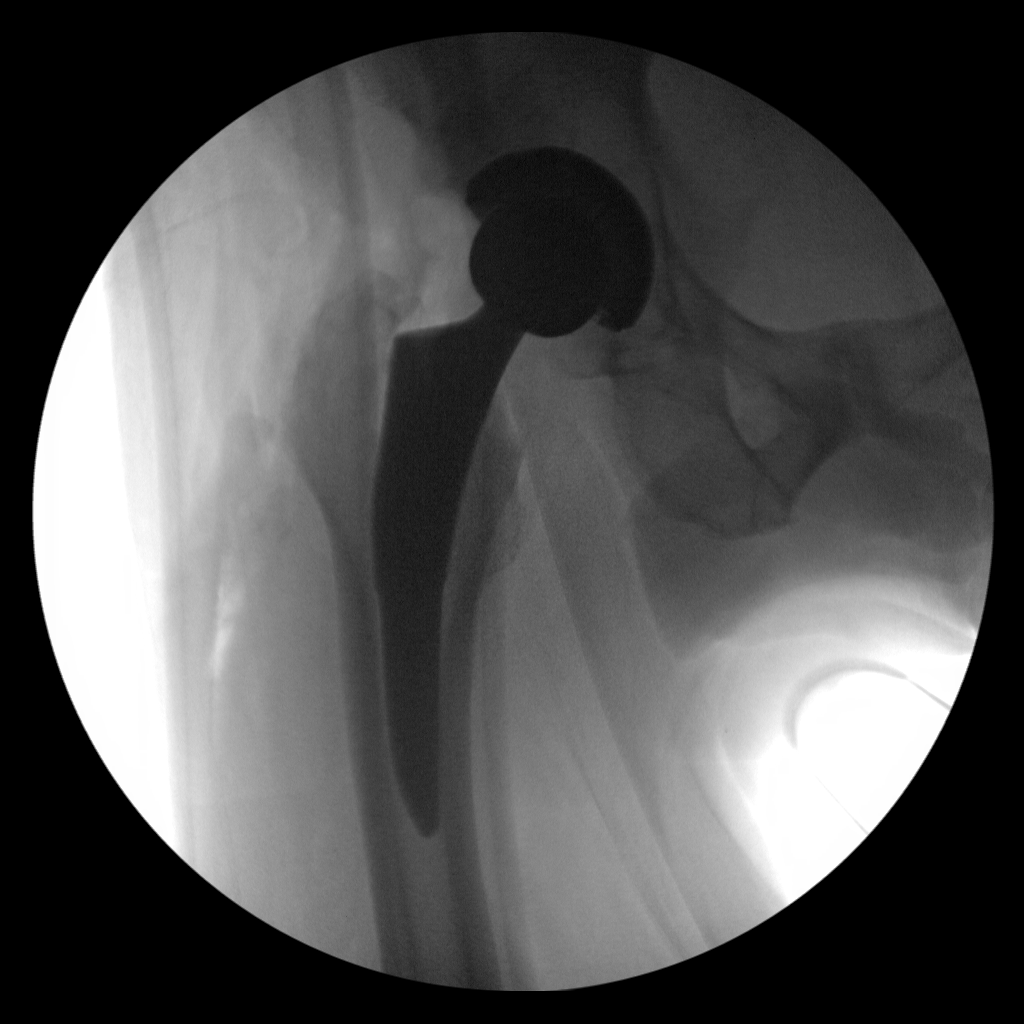
[im 2/2]
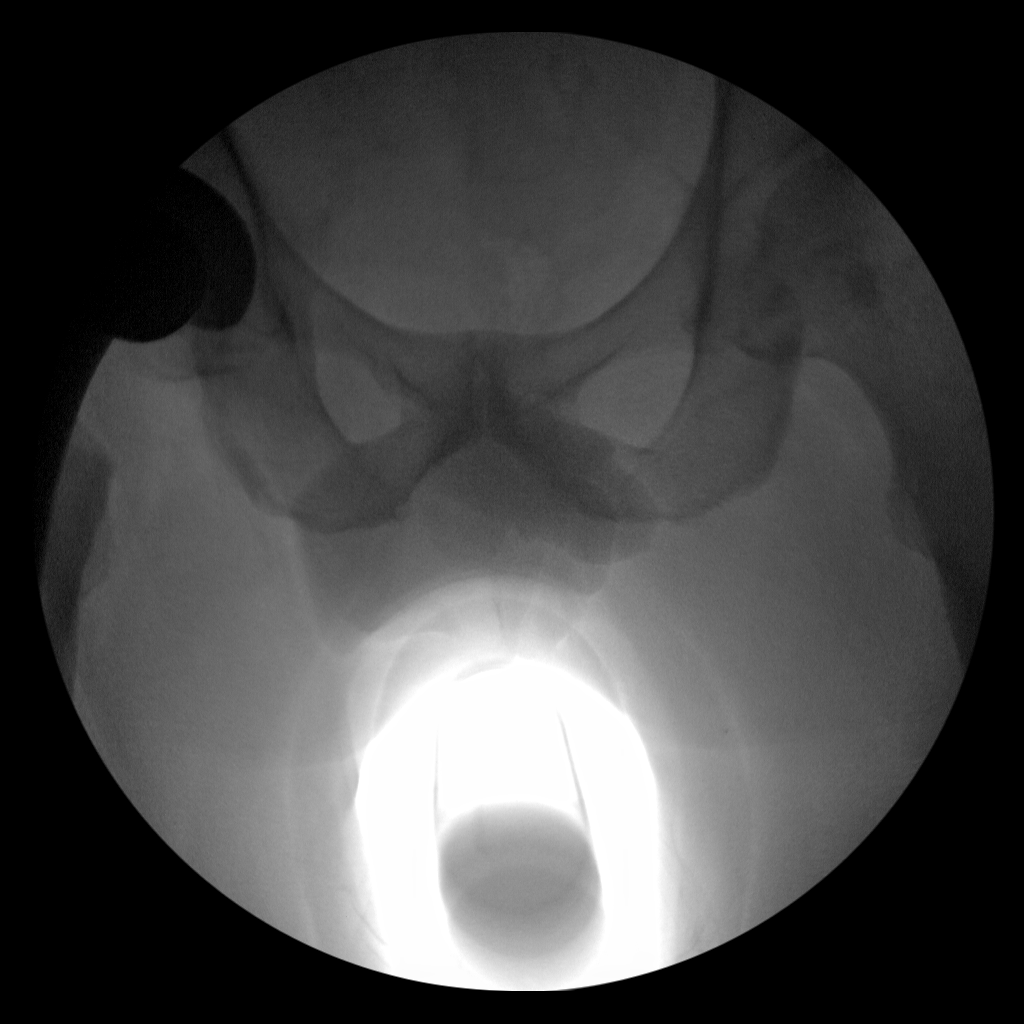

[2 of 2 positions shown; findings below may reference images not displayed]

FINDINGS: Two fluoroscopic images demonstrate total right hip arthroplasty
with long femoral component. The hardware is well seated within the
acetabulum. No evidence of immediate complications.
IMPRESSION: Fluoroscopic images from right hip arthroplasty. No immediate
complications apparent.

## 2016-07-10 ENCOUNTER — Encounter: Payer: Self-pay | Admitting: Gastroenterology

## 2016-09-17 ENCOUNTER — Ambulatory Visit (AMBULATORY_SURGERY_CENTER): Payer: Self-pay | Admitting: *Deleted

## 2016-09-17 VITALS — Ht 69.0 in | Wt 214.8 lb

## 2016-09-17 DIAGNOSIS — Z86010 Personal history of colonic polyps: Secondary | ICD-10-CM

## 2016-09-17 MED ORDER — NA SULFATE-K SULFATE-MG SULF 17.5-3.13-1.6 GM/177ML PO SOLN: ORAL | 0 refills | Status: DC

## 2016-09-17 NOTE — Progress Notes (Signed)
No allergies to eggs or soy. No problems with anesthesia.  Pt given Emmi instructions for colonoscopy  No oxygen use  No diet drug use  

## 2016-09-19 ENCOUNTER — Ambulatory Visit (AMBULATORY_SURGERY_CENTER): Payer: Medicare Other | Admitting: Gastroenterology

## 2016-09-19 ENCOUNTER — Encounter: Payer: Self-pay | Admitting: Gastroenterology

## 2016-09-19 VITALS — BP 109/69 | HR 82 | Temp 96.8°F | Resp 12 | Ht 69.0 in | Wt 214.0 lb

## 2016-09-19 DIAGNOSIS — K635 Polyp of colon: Secondary | ICD-10-CM

## 2016-09-19 DIAGNOSIS — D125 Benign neoplasm of sigmoid colon: Secondary | ICD-10-CM

## 2016-09-19 DIAGNOSIS — Z86010 Personal history of colonic polyps: Secondary | ICD-10-CM

## 2016-09-19 DIAGNOSIS — D123 Benign neoplasm of transverse colon: Secondary | ICD-10-CM

## 2016-09-19 MED ORDER — SODIUM CHLORIDE 0.9 % IV SOLN: 500.0000 mL | INTRAVENOUS | Status: DC

## 2016-09-19 NOTE — Op Note (Signed)
Center Patient Name: James Gates Procedure Date: 09/19/2016 10:11 AM MRN: [id number] Endoscopist: Remo Lipps P. Evora Schechter MD, MD Age: 66 Referring MD:  Date of Birth: August 02, 1951 Gender: Male Account #: [id number] Procedure:                Colonoscopy Indications:              Surveillance: Personal history of adenomatous                            polyps on last colonoscopy (last exam in 2008) Medicines:                Monitored Anesthesia Care Procedure:                Pre-Anesthesia Assessment:                           - Prior to the procedure, a History and Physical                            was performed, and patient medications and                            allergies were reviewed. The patient's tolerance of                            previous anesthesia was also reviewed. The risks                            and benefits of the procedure and the sedation                            options and risks were discussed with the patient.                            All questions were answered, and informed consent                            was obtained. Prior Anticoagulants: The patient has                            taken aspirin, last dose was 1 day prior to                            procedure. ASA Grade Assessment: II - A patient                            with mild systemic disease. After reviewing the                            risks and benefits, the patient was deemed in                            satisfactory condition to undergo the procedure.  After obtaining informed consent, the colonoscope                            was passed under direct vision. Throughout the                            procedure, the patient's blood pressure, pulse, and                            oxygen saturations were monitored continuously. The                            Model CF-HQ190L [id number]) scope was introduced                            through the  anus and advanced to the the cecum,                            identified by appendiceal orifice and ileocecal                            valve. The colonoscopy was performed without                            difficulty. The patient tolerated the procedure                            well. The quality of the bowel preparation was                            good. The ileocecal valve, appendiceal orifice, and                            rectum were photographed. Scope In: 10:20:02 AM Scope Out: 10:37:19 AM Scope Withdrawal Time: 0 hours 15 minutes 51 seconds  Total Procedure Duration: 0 hours 17 minutes 17 seconds  Findings:                 The perianal and digital rectal examinations were                            normal.                           Two sessile polyps were found in the transverse                            colon. The polyps were 4 to 5 mm in size. These                            polyps were removed with a cold snare. Resection                            and retrieval were complete.  A 4 mm polyp was found in the sigmoid colon. The                            polyp was sessile. The polyp was removed with a                            cold snare. Resection and retrieval were complete.                           A few small-mouthed diverticula were found in the                            transverse colon and hepatic flexure.                           Internal hemorrhoids were found during retroflexion.                           The exam was otherwise without abnormality. Complications:            No immediate complications. Estimated blood loss:                            Minimal. Estimated Blood Loss:     Estimated blood loss was minimal. Impression:               - Two 4 to 5 mm polyps in the transverse colon,                            removed with a cold snare. Resected and retrieved.                           - One 4 mm polyp in the sigmoid colon,  removed with                            a cold snare. Resected and retrieved.                           - Diverticulosis in the transverse colon and at the                            hepatic flexure.                           - Internal hemorrhoids.                           - The examination was otherwise normal. Recommendation:           - Patient has a contact number available for                            emergencies. The signs and symptoms of potential  delayed complications were discussed with the                            patient. Return to normal activities tomorrow.                            Written discharge instructions were provided to the                            patient.                           - Resume previous diet.                           - Continue present medications.                           - No ibuprofen, naproxen, or other non-steroidal                            anti-inflammatory drugs for 2 weeks after polyp                            removal.                           - Await pathology results.                           - Repeat colonoscopy is recommended for                            surveillance. The colonoscopy date will be                            determined after pathology results from today's                            exam become available for review. Remo Lipps P. Finn Altemose MD, MD 09/19/2016 10:41:19 AM This report has been signed electronically.

## 2016-09-19 NOTE — Progress Notes (Signed)
Called to room to assist during endoscopic procedure.  Patient ID and intended procedure confirmed with present staff. Received instructions for my participation in the procedure from the performing physician.  

## 2016-09-19 NOTE — Progress Notes (Signed)
A and O x3. Report to RN. Tolerated MAC anesthesia well.

## 2016-09-19 NOTE — Patient Instructions (Signed)
YOU HAD AN ENDOSCOPIC PROCEDURE TODAY AT [facility]:   Refer to the procedure report that was given to you for any specific questions about what was found during the examination.  If the procedure report does not answer your questions, please call your gastroenterologist to clarify.  If you requested that your care partner not be given the details of your procedure findings, then the procedure report has been included in a sealed envelope for you to review at your convenience later.  YOU SHOULD EXPECT: Some feelings of bloating in the abdomen. Passage of more gas than usual.  Walking can help get rid of the air that was put into your GI tract during the procedure and reduce the bloating. If you had a lower endoscopy (such as a colonoscopy or flexible sigmoidoscopy) you may notice spotting of blood in your stool or on the toilet paper. If you underwent a bowel prep for your procedure, you may not have a normal bowel movement for a few days.  Please Note:  You might notice some irritation and congestion in your nose or some drainage.  This is from the oxygen used during your procedure.  There is no need for concern and it should clear up in a day or so.  SYMPTOMS TO REPORT IMMEDIATELY:   Following lower endoscopy (colonoscopy or flexible sigmoidoscopy):  Excessive amounts of blood in the stool  Significant tenderness or worsening of abdominal pains  Swelling of the abdomen that is new, acute  Fever of 100F or higher   For urgent or emergent issues, a gastroenterologist can be reached at any hour by calling [phone].  Please read all handouts given to you by your recovery nurse. NO NSAIDS, ibuprofen advil for 2 weeks.   DIET:  We do recommend a small meal at first, but then you may proceed to your regular diet.  Drink plenty of fluids but you should avoid alcoholic beverages for 24 hours.  ACTIVITY:  You should plan to take it easy for the rest of today and you should NOT  DRIVE or use heavy machinery until tomorrow (because of the sedation medicines used during the test).    FOLLOW UP: Our staff will call the number listed on your records the next business day following your procedure to check on you and address any questions or concerns that you may have regarding the information given to you following your procedure. If we do not reach you, we will leave a message.  However, if you are feeling well and you are not experiencing any problems, there is no need to return our call.  We will assume that you have returned to your regular daily activities without incident.  If any biopsies were taken you will be contacted by phone or by letter within the next 1-3 weeks.  Please call us at [phone] if you have not heard about the biopsies in 3 weeks.    SIGNATURES/CONFIDENTIALITY: You and/or your care partner have signed paperwork which will be entered into your electronic medical record.  These signatures attest to the fact that that the information above on your After Visit Summary has been reviewed and is understood.  Full responsibility of the confidentiality of this discharge information lies with you and/or your care-partner.  Thank you for letting us take care of your healthcare needs today.

## 2016-09-20 ENCOUNTER — Telehealth: Payer: Self-pay

## 2016-09-20 NOTE — Telephone Encounter (Signed)
  Follow up Call-  Call back number 09/19/2016  Post procedure Call Back phone  # [phone]  Permission to leave phone message Yes  Some recent data might be hidden     Patient questions:  Do you have a fever, pain , or abdominal swelling? No. Pain Score  0 *  Have you tolerated food without any problems? Yes.    Have you been able to return to your normal activities? Yes.    Do you have any questions about your discharge instructions: Diet   No. Medications  No. Follow up visit  No.  Do you have questions or concerns about your Care? No.  Actions: * If pain score is 4 or above: No action needed, pain <4.

## 2016-09-25 ENCOUNTER — Encounter: Payer: Self-pay | Admitting: Gastroenterology

## 2016-09-25 ENCOUNTER — Encounter: Payer: BC Managed Care – PPO | Admitting: Gastroenterology

## 2017-01-17 IMAGING — CR DG LUMBAR SPINE 1V
1 series · 1 of 1 positions shown · non-contrast
Comparison: 11/21/2015, 01/03/2015

CLINICAL DATA: L2-L5 laminectomy intraoperative localization

EXAM:
LUMBAR SPINE - 1 VIEW

[lat]
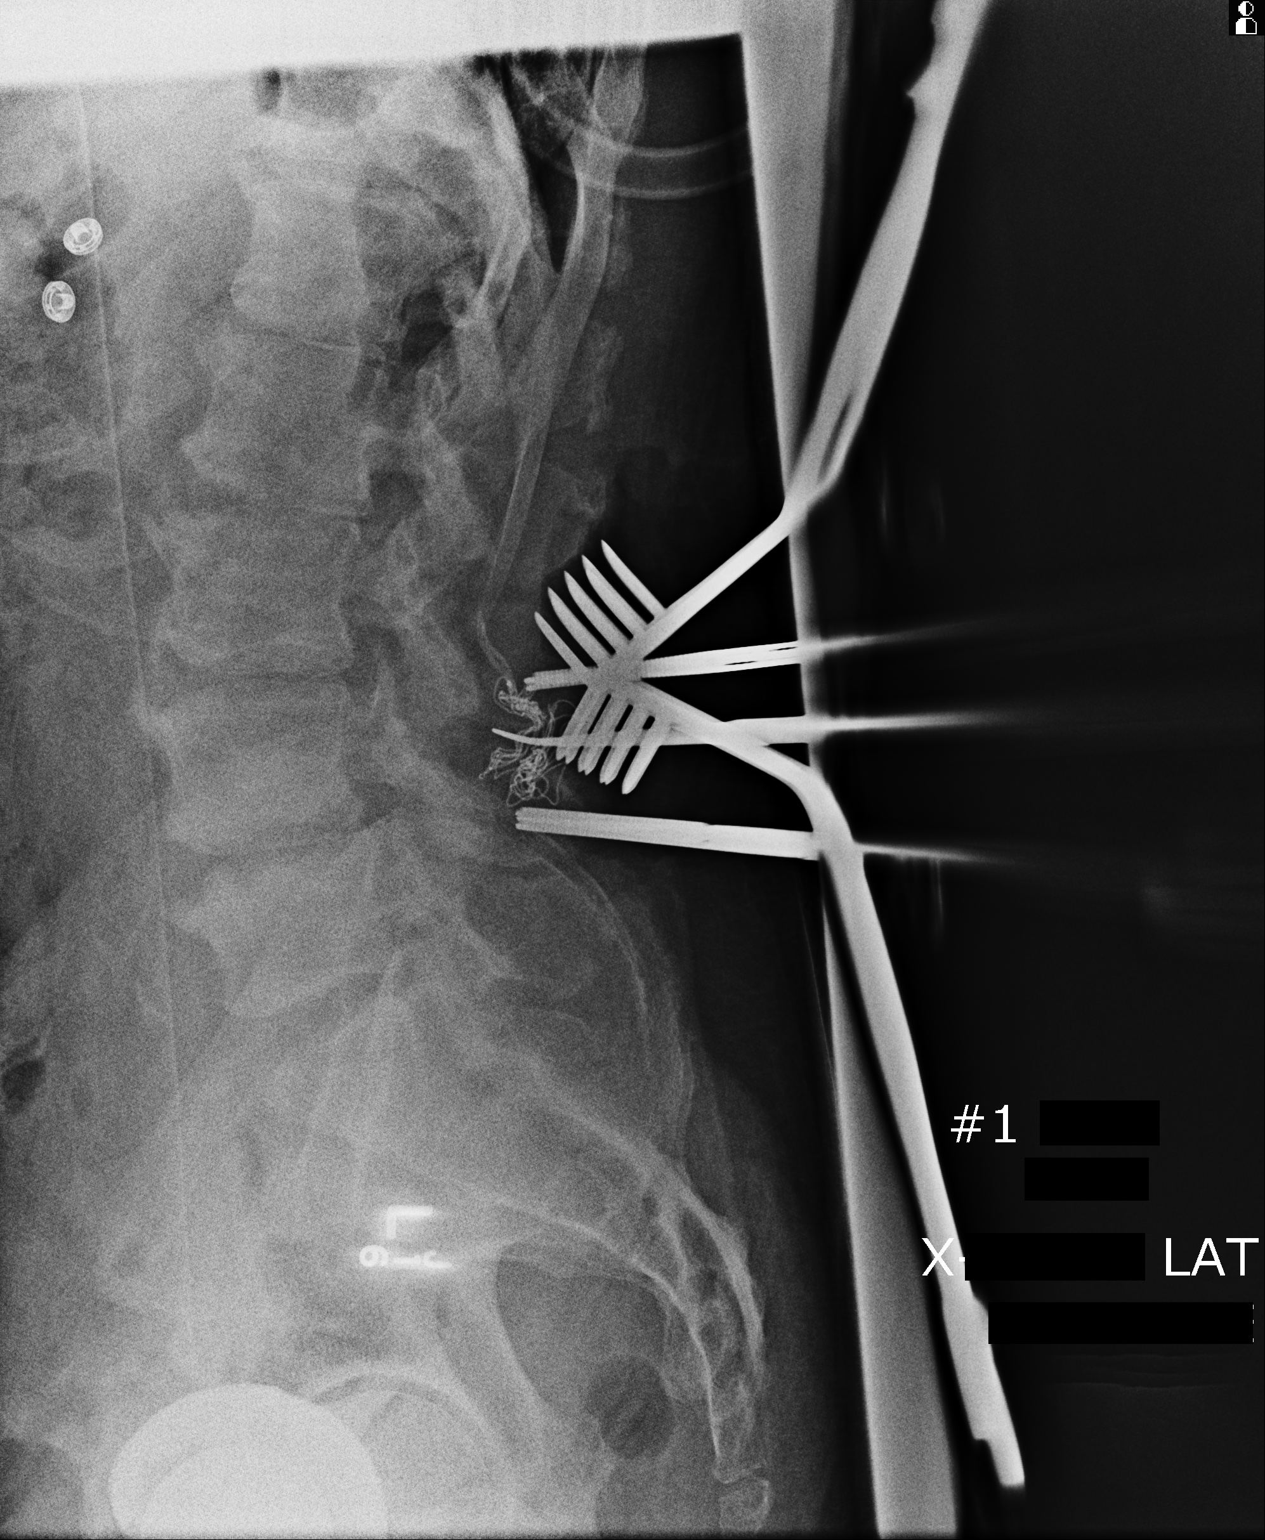

[1 of 1 positions shown; findings below may reference images not displayed]

FINDINGS: The central localization probe between surgical clamps is seen
dorsally, with tip at the level of the L4 pedicles.
IMPRESSION: Intraoperative localization

## 2017-02-14 ENCOUNTER — Telehealth: Payer: Self-pay | Admitting: *Deleted

## 2017-02-14 NOTE — Telephone Encounter (Signed)
Orders for:  A7034 pap nasal mask 1 per 3 months A7033 pap pillow for nasal cannula 2 per 1 month A7035 pap headgear 1 per 6 months A7037 pap tubing 1 per 3 months A7038 pap filter-disposable 2 per 1 month A7046 pap water chamber for humidifier 1 per 6 months  Faxed to the number provided.

## 2018-01-27 ENCOUNTER — Other Ambulatory Visit: Payer: Self-pay | Admitting: Neurosurgery

## 2018-02-06 ENCOUNTER — Other Ambulatory Visit: Payer: Self-pay | Admitting: Neurosurgery

## 2018-02-09 ENCOUNTER — Encounter (HOSPITAL_COMMUNITY): Payer: Self-pay

## 2018-02-09 NOTE — Pre-Procedure Instructions (Signed)
ZEV BLUE  02/09/2018      [id number] [name] [place] [address] Phone: [phone] Fax: [phone]    Your procedure is scheduled on June 24  Report to [facility] Admitting at 9:30 A.M.  Call this number if you have problems the morning of surgery:  [phone]   Remember:  Do not eat or drink after midnight.                      Take these medicines the morning of surgery with A SIP OF WATER : none                   7 days prior to surgery STOP Aleve, Naproxen, Ibuprofen, Motrin, Advil, Goody's, BC's, all herbal medications, fish oil, and all vitamins                  Follow your doctors instructions regarding your Aspirin.  If no instructions were given by your doctor, then you will need to call the prescribing office office to get instructions.      Do not wear jewelry.  Do not wear lotions, powders, or perfumes, or deodorant.  Do not shave 48 hours prior to surgery.  Men may shave face and neck.  Do not bring valuables to the hospital.  [facility] is not responsible for any belongings or valuables.  Contacts, dentures or bridgework may not be worn into surgery.  Leave your suitcase in the car.  After surgery it may be brought to your room.  For patients admitted to the hospital, discharge time will be determined by your treatment team.  Patients discharged the day of surgery will not be allowed to drive home.    Special instructions:   [facility]- Preparing For Surgery  Before surgery, you can play an important role. Because skin is not sterile, your skin needs to be as free of germs as possible. You can reduce the number of germs on your skin by washing with CHG (chlorahexidine gluconate) Soap before surgery.  CHG is an antiseptic cleaner which kills germs and bonds with the skin to continue killing germs even after washing.    Oral Hygiene is also important to reduce your risk of  infection.  Remember - BRUSH YOUR TEETH THE MORNING OF SURGERY WITH YOUR REGULAR TOOTHPASTE  Please do not use if you have an allergy to CHG or antibacterial soaps. If your skin becomes reddened/irritated stop using the CHG.  Do not shave (including legs and underarms) for at least 48 hours prior to first CHG shower. It is OK to shave your face.  Please follow these instructions carefully.   1. Shower the NIGHT BEFORE SURGERY and the MORNING OF SURGERY with CHG.   2. If you chose to wash your hair, wash your hair first as usual with your normal shampoo.  3. After you shampoo, rinse your hair and body thoroughly to remove the shampoo.  4. Use CHG as you would any other liquid soap. You can apply CHG directly to the skin and wash gently with a scrungie or a clean washcloth.   5. Apply the CHG Soap to your body ONLY FROM THE NECK DOWN.  Do not use on open wounds or open sores. Avoid contact with your eyes, ears, mouth and genitals (private parts). Wash Face and genitals (private parts)  with your normal soap.  6. Wash thoroughly,  paying special attention to the area where your surgery will be performed.  7. Thoroughly rinse your body with warm water from the neck down.  8. DO NOT shower/wash with your normal soap after using and rinsing off the CHG Soap.  9. Pat yourself dry with a CLEAN TOWEL.  10. Wear CLEAN PAJAMAS to bed the night before surgery, wear comfortable clothes the morning of surgery  11. Place CLEAN SHEETS on your bed the night of your first shower and DO NOT SLEEP WITH PETS.    Day of Surgery:  Do not apply any deodorants/lotions.  Please wear clean clothes to the hospital/surgery center.   Remember to brush your teeth WITH YOUR REGULAR TOOTHPASTE.    Please read over the following fact sheets that you were given. Coughing and Deep Breathing, MRSA Information and Surgical Site Infection Prevention

## 2018-02-10 ENCOUNTER — Encounter (HOSPITAL_COMMUNITY): Payer: Self-pay

## 2018-02-10 ENCOUNTER — Encounter (HOSPITAL_COMMUNITY)
Admission: RE | Admit: 2018-02-10 | Discharge: 2018-02-10 | Disposition: A | Discharge status: Home / Self Care | Payer: Medicare Other | Source: Ambulatory Visit | Attending: Neurosurgery | Admitting: Neurosurgery

## 2018-02-10 ENCOUNTER — Other Ambulatory Visit: Payer: Self-pay

## 2018-02-10 DIAGNOSIS — Z01812 Encounter for preprocedural laboratory examination: Secondary | ICD-10-CM | POA: Diagnosis present

## 2018-02-10 HISTORY — DX: Prediabetes: R73.03

## 2018-02-10 HISTORY — DX: Post-traumatic stress disorder, unspecified: F43.10

## 2018-02-10 LAB — SURGICAL PCR SCREEN
MRSA, PCR: POSITIVE — AB
Staphylococcus aureus: POSITIVE — AB

## 2018-02-10 LAB — CBC
HEMATOCRIT: 47.8 % (ref 39.0–52.0)
HEMOGLOBIN: 15.6 g/dL (ref 13.0–17.0)
MCH: 28.8 pg (ref 26.0–34.0)
MCHC: 32.6 g/dL (ref 30.0–36.0)
MCV: 88.4 fL (ref 78.0–100.0)
Platelets: 160 10*3/uL (ref 150–400)
RBC: 5.41 MIL/uL (ref 4.22–5.81)
RDW: 12.9 % (ref 11.5–15.5)
WBC: 5.4 10*3/uL (ref 4.0–10.5)

## 2018-02-10 LAB — HEMOGLOBIN A1C
Hgb A1c MFr Bld: 5.9 % — ABNORMAL HIGH (ref 4.8–5.6)
MEAN PLASMA GLUCOSE: 122.63 mg/dL

## 2018-02-10 LAB — GLUCOSE, CAPILLARY: Glucose-Capillary: 130 mg/dL — ABNORMAL HIGH (ref 65–99)

## 2018-02-10 NOTE — Progress Notes (Signed)
PCP - Dr. Mirna Mires  Cardiologist - patient denies  Chest x-ray - N/A EKG - N/A Stress Test - 20+ years ago ECHO -20+ years ago  Cardiac Cath - patient denies   Sleep Study - Yes, patient positive for sleep apnea. CPAP - Currently has a CPAP machine but does not use it.    Fasting Blood Sugar - 130 Pt states he is pre-diabetic. Not currently on any medications. Will check A1C.   Blood Thinner Instructions: Takes ASA as needed Aspirin Instructions: Instructed to stop ASA 7 days prior to surgery.   Anesthesia review: Yes  Patient denies shortness of breath, fever, cough and chest pain at PAT appointment   Patient verbalized understanding of instructions that were given to them at the PAT appointment. Patient was also instructed that they will need to review over the PAT instructions again at home before surgery.  Jacqlyn Larsen, RN

## 2018-02-10 NOTE — Progress Notes (Signed)
Pt screened at pre-admission appointment today for MRSA and MSSA. 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 Pt has been notified of results at [phone] and verbally agreed [facility] [address] [phone]) and will begin treatment as soon as possible.   Jacqlyn Larsen, RN

## 2018-02-13 MED ORDER — VANCOMYCIN HCL 10 G IV SOLR
1500.0000 mg | Freq: Once | INTRAVENOUS | Status: AC
  Administered 2018-02-16: 1500 mg via INTRAVENOUS
  Filled 2018-02-13 (×2): qty 1500

## 2018-02-16 ENCOUNTER — Ambulatory Visit (HOSPITAL_COMMUNITY): Payer: Medicare Other | Admitting: Certified Registered Nurse Anesthetist

## 2018-02-16 ENCOUNTER — Encounter (HOSPITAL_COMMUNITY): Payer: Self-pay | Admitting: Certified Registered Nurse Anesthetist

## 2018-02-16 ENCOUNTER — Ambulatory Visit (HOSPITAL_COMMUNITY): Payer: Medicare Other

## 2018-02-16 ENCOUNTER — Encounter (HOSPITAL_COMMUNITY)
Admission: RE | Disposition: A | Discharge status: Home / Self Care | Payer: Self-pay | Source: Ambulatory Visit | Attending: Neurosurgery

## 2018-02-16 ENCOUNTER — Observation Stay (HOSPITAL_COMMUNITY)
Admission: RE | Admit: 2018-02-16 | Discharge: 2018-02-17 | Disposition: A | Discharge status: Home / Self Care | Payer: Medicare Other | Source: Ambulatory Visit | Attending: Neurosurgery | Admitting: Neurosurgery

## 2018-02-16 DIAGNOSIS — Z96641 Presence of right artificial hip joint: Secondary | ICD-10-CM | POA: Diagnosis not present

## 2018-02-16 DIAGNOSIS — Z7982 Long term (current) use of aspirin: Secondary | ICD-10-CM | POA: Diagnosis not present

## 2018-02-16 DIAGNOSIS — G4733 Obstructive sleep apnea (adult) (pediatric): Secondary | ICD-10-CM | POA: Insufficient documentation

## 2018-02-16 DIAGNOSIS — M5116 Intervertebral disc disorders with radiculopathy, lumbar region: Secondary | ICD-10-CM | POA: Diagnosis present

## 2018-02-16 DIAGNOSIS — E785 Hyperlipidemia, unspecified: Secondary | ICD-10-CM | POA: Diagnosis not present

## 2018-02-16 DIAGNOSIS — M48062 Spinal stenosis, lumbar region with neurogenic claudication: Secondary | ICD-10-CM | POA: Insufficient documentation

## 2018-02-16 DIAGNOSIS — Z86010 Personal history of colonic polyps: Secondary | ICD-10-CM | POA: Insufficient documentation

## 2018-02-16 DIAGNOSIS — Z9989 Dependence on other enabling machines and devices: Secondary | ICD-10-CM | POA: Diagnosis not present

## 2018-02-16 DIAGNOSIS — F4310 Post-traumatic stress disorder, unspecified: Secondary | ICD-10-CM | POA: Insufficient documentation

## 2018-02-16 DIAGNOSIS — Z79899 Other long term (current) drug therapy: Secondary | ICD-10-CM | POA: Diagnosis not present

## 2018-02-16 DIAGNOSIS — K219 Gastro-esophageal reflux disease without esophagitis: Secondary | ICD-10-CM | POA: Insufficient documentation

## 2018-02-16 DIAGNOSIS — Z87891 Personal history of nicotine dependence: Secondary | ICD-10-CM | POA: Insufficient documentation

## 2018-02-16 DIAGNOSIS — M5126 Other intervertebral disc displacement, lumbar region: Secondary | ICD-10-CM | POA: Diagnosis present

## 2018-02-16 DIAGNOSIS — M4726 Other spondylosis with radiculopathy, lumbar region: Secondary | ICD-10-CM | POA: Diagnosis not present

## 2018-02-16 DIAGNOSIS — Z419 Encounter for procedure for purposes other than remedying health state, unspecified: Secondary | ICD-10-CM

## 2018-02-16 HISTORY — PX: LUMBAR LAMINECTOMY/DECOMPRESSION MICRODISCECTOMY: SHX5026

## 2018-02-16 LAB — BASIC METABOLIC PANEL
Anion gap: 8 (ref 5–15)
BUN: 17 mg/dL (ref 6–20)
CO2: 26 mmol/L (ref 22–32)
CREATININE: 1.26 mg/dL — AB (ref 0.61–1.24)
Calcium: 9.5 mg/dL (ref 8.9–10.3)
Chloride: 104 mmol/L (ref 101–111)
GFR calc Af Amer: >60 mL/min (ref >60)
GFR, EST NON AFRICAN AMERICAN: 57 mL/min — AB (ref >60)
Glucose, Bld: 107 mg/dL — ABNORMAL HIGH (ref 65–99)
Potassium: 4.2 mmol/L (ref 3.5–5.1)
Sodium: 138 mmol/L (ref 135–145)

## 2018-02-16 SURGERY — LUMBAR LAMINECTOMY/DECOMPRESSION MICRODISCECTOMY 1 LEVEL
Anesthesia: General | Laterality: Right

## 2018-02-16 MED ORDER — ONDANSETRON HCL 4 MG/2ML IJ SOLN
INTRAMUSCULAR | Status: DC | PRN
  Administered 2018-02-16: 4 mg via INTRAVENOUS

## 2018-02-16 MED ORDER — ACETAMINOPHEN 650 MG RE SUPP: 650.0000 mg | RECTAL | Status: DC | PRN

## 2018-02-16 MED ORDER — CHLORHEXIDINE GLUCONATE CLOTH 2 % EX PADS: 6.0000 | MEDICATED_PAD | Freq: Once | CUTANEOUS | Status: DC

## 2018-02-16 MED ORDER — PHENYLEPHRINE 40 MCG/ML (10ML) SYRINGE FOR IV PUSH (FOR BLOOD PRESSURE SUPPORT)
PREFILLED_SYRINGE | INTRAVENOUS | Status: DC | PRN
  Administered 2018-02-16 (×2): 80 ug via INTRAVENOUS
  Administered 2018-02-16: 40 ug via INTRAVENOUS
  Administered 2018-02-16: 80 ug via INTRAVENOUS
  Administered 2018-02-16: 120 ug via INTRAVENOUS

## 2018-02-16 MED ORDER — SUCCINYLCHOLINE CHLORIDE 20 MG/ML IJ SOLN
INTRAMUSCULAR | Status: AC
  Filled 2018-02-16: qty 1

## 2018-02-16 MED ORDER — SUGAMMADEX SODIUM 200 MG/2ML IV SOLN
INTRAVENOUS | Status: DC | PRN
  Administered 2018-02-16: 200 mg via INTRAVENOUS

## 2018-02-16 MED ORDER — SUGAMMADEX SODIUM 200 MG/2ML IV SOLN
INTRAVENOUS | Status: AC
  Filled 2018-02-16: qty 2

## 2018-02-16 MED ORDER — FENTANYL CITRATE (PF) 100 MCG/2ML IJ SOLN
INTRAMUSCULAR | Status: DC | PRN
  Administered 2018-02-16: 100 ug via INTRAVENOUS

## 2018-02-16 MED ORDER — MORPHINE SULFATE (PF) 4 MG/ML IV SOLN: 4.0000 mg | INTRAVENOUS | Status: DC | PRN

## 2018-02-16 MED ORDER — CYCLOBENZAPRINE HCL 5 MG PO TABS: 5.0000 mg | ORAL_TABLET | Freq: Three times a day (TID) | ORAL | Status: DC | PRN

## 2018-02-16 MED ORDER — ACETAMINOPHEN 325 MG PO TABS: 650.0000 mg | ORAL_TABLET | ORAL | Status: DC | PRN

## 2018-02-16 MED ORDER — MAGNESIUM HYDROXIDE 400 MG/5ML PO SUSP: 30.0000 mL | Freq: Every day | ORAL | Status: DC | PRN

## 2018-02-16 MED ORDER — BISACODYL 10 MG RE SUPP: 10.0000 mg | Freq: Every day | RECTAL | Status: DC | PRN

## 2018-02-16 MED ORDER — ONDANSETRON HCL 4 MG/2ML IJ SOLN
INTRAMUSCULAR | Status: AC
  Filled 2018-02-16: qty 2

## 2018-02-16 MED ORDER — HYDROXYZINE HCL 50 MG/ML IM SOLN: 50.0000 mg | INTRAMUSCULAR | Status: DC | PRN

## 2018-02-16 MED ORDER — HYDROCODONE-ACETAMINOPHEN 5-325 MG PO TABS
1.0000 | ORAL_TABLET | ORAL | Status: DC | PRN
Start: 2018-02-16 — End: 2018-02-17
  Administered 2018-02-16 – 2018-02-17 (×2): 2 via ORAL
  Filled 2018-02-16 (×2): qty 2

## 2018-02-16 MED ORDER — THROMBIN 5000 UNITS EX SOLR
CUTANEOUS | Status: AC
  Filled 2018-02-16: qty 15000

## 2018-02-16 MED ORDER — BUPIVACAINE HCL (PF) 0.5 % IJ SOLN
INTRAMUSCULAR | Status: AC
  Filled 2018-02-16: qty 30

## 2018-02-16 MED ORDER — FENTANYL CITRATE (PF) 100 MCG/2ML IJ SOLN
INTRAMUSCULAR | Status: AC
  Filled 2018-02-16: qty 2

## 2018-02-16 MED ORDER — LIDOCAINE 2% (20 MG/ML) 5 ML SYRINGE
INTRAMUSCULAR | Status: AC
  Filled 2018-02-16: qty 5

## 2018-02-16 MED ORDER — METHYLPREDNISOLONE ACETATE 80 MG/ML IJ SUSP
INTRAMUSCULAR | Status: AC
  Filled 2018-02-16: qty 1

## 2018-02-16 MED ORDER — HEMOSTATIC AGENTS (NO CHARGE) OPTIME
TOPICAL | Status: DC | PRN
  Administered 2018-02-16: 1 via TOPICAL

## 2018-02-16 MED ORDER — DEXAMETHASONE SODIUM PHOSPHATE 10 MG/ML IJ SOLN
INTRAMUSCULAR | Status: DC | PRN
  Administered 2018-02-16: 10 mg via INTRAVENOUS

## 2018-02-16 MED ORDER — MIDAZOLAM HCL 5 MG/5ML IJ SOLN
INTRAMUSCULAR | Status: DC | PRN
  Administered 2018-02-16: 2 mg via INTRAVENOUS

## 2018-02-16 MED ORDER — LIDOCAINE-EPINEPHRINE 1 %-1:100000 IJ SOLN
INTRAMUSCULAR | Status: DC | PRN
  Administered 2018-02-16: 20 mL

## 2018-02-16 MED ORDER — ROCURONIUM BROMIDE 50 MG/5ML IV SOLN
INTRAVENOUS | Status: AC
  Filled 2018-02-16: qty 2

## 2018-02-16 MED ORDER — THROMBIN 5000 UNITS EX SOLR
CUTANEOUS | Status: DC | PRN
  Administered 2018-02-16 (×2): 5000 [IU] via TOPICAL

## 2018-02-16 MED ORDER — ACETAMINOPHEN 10 MG/ML IV SOLN
INTRAVENOUS | Status: AC
  Filled 2018-02-16: qty 100

## 2018-02-16 MED ORDER — FLEET ENEMA 7-19 GM/118ML RE ENEM: 1.0000 | ENEMA | Freq: Once | RECTAL | Status: DC | PRN

## 2018-02-16 MED ORDER — TAMSULOSIN HCL 0.4 MG PO CAPS
0.8000 mg | ORAL_CAPSULE | Freq: Every day | ORAL | Status: DC
  Administered 2018-02-16: 0.8 mg via ORAL
  Filled 2018-02-16: qty 2

## 2018-02-16 MED ORDER — ROCURONIUM BROMIDE 50 MG/5ML IV SOLN
INTRAVENOUS | Status: AC
  Filled 2018-02-16: qty 1

## 2018-02-16 MED ORDER — FENTANYL CITRATE (PF) 100 MCG/2ML IJ SOLN
INTRAMUSCULAR | Status: DC | PRN
  Administered 2018-02-16: 50 ug via INTRAVENOUS
  Administered 2018-02-16: 100 ug via INTRAVENOUS
  Administered 2018-02-16: 50 ug via INTRAVENOUS

## 2018-02-16 MED ORDER — MENTHOL 3 MG MT LOZG: 1.0000 | LOZENGE | OROMUCOSAL | Status: DC | PRN

## 2018-02-16 MED ORDER — ROCURONIUM BROMIDE 100 MG/10ML IV SOLN
INTRAVENOUS | Status: DC | PRN
  Administered 2018-02-16: 50 mg via INTRAVENOUS

## 2018-02-16 MED ORDER — 0.9 % SODIUM CHLORIDE (POUR BTL) OPTIME
TOPICAL | Status: DC | PRN
  Administered 2018-02-16: 1000 mL

## 2018-02-16 MED ORDER — SCOPOLAMINE 1 MG/3DAYS TD PT72
MEDICATED_PATCH | TRANSDERMAL | Status: AC
  Filled 2018-02-16: qty 1

## 2018-02-16 MED ORDER — KCL IN DEXTROSE-NACL 20-5-0.45 MEQ/L-%-% IV SOLN: INTRAVENOUS | Status: DC

## 2018-02-16 MED ORDER — FENTANYL CITRATE (PF) 250 MCG/5ML IJ SOLN
INTRAMUSCULAR | Status: AC
  Filled 2018-02-16: qty 5

## 2018-02-16 MED ORDER — SCOPOLAMINE 1 MG/3DAYS TD PT72
1.0000 | MEDICATED_PATCH | TRANSDERMAL | Status: DC
  Administered 2018-02-16: 1.5 mg via TRANSDERMAL

## 2018-02-16 MED ORDER — LACTATED RINGERS IV SOLN
INTRAVENOUS | Status: DC
  Administered 2018-02-16 (×3): via INTRAVENOUS

## 2018-02-16 MED ORDER — SODIUM CHLORIDE 0.9 % IV SOLN: 250.0000 mL | INTRAVENOUS | Status: DC

## 2018-02-16 MED ORDER — LIDOCAINE-EPINEPHRINE 1 %-1:100000 IJ SOLN
INTRAMUSCULAR | Status: AC
  Filled 2018-02-16: qty 1

## 2018-02-16 MED ORDER — SODIUM CHLORIDE 0.9% FLUSH: 3.0000 mL | INTRAVENOUS | Status: DC | PRN

## 2018-02-16 MED ORDER — METHYLPREDNISOLONE ACETATE 80 MG/ML IJ SUSP
INTRAMUSCULAR | Status: DC | PRN
  Administered 2018-02-16: 80 mg

## 2018-02-16 MED ORDER — CEFAZOLIN SODIUM-DEXTROSE 2-3 GM-%(50ML) IV SOLR
INTRAVENOUS | Status: DC | PRN
  Administered 2018-02-16: 2 g via INTRAVENOUS

## 2018-02-16 MED ORDER — CHLORHEXIDINE GLUCONATE CLOTH 2 % EX PADS
6.0000 | MEDICATED_PAD | Freq: Every day | CUTANEOUS | Status: DC
  Administered 2018-02-17: 6 via TOPICAL

## 2018-02-16 MED ORDER — PHENOL 1.4 % MT LIQD: 1.0000 | OROMUCOSAL | Status: DC | PRN

## 2018-02-16 MED ORDER — ALUM & MAG HYDROXIDE-SIMETH 200-200-20 MG/5ML PO SUSP: 30.0000 mL | Freq: Four times a day (QID) | ORAL | Status: DC | PRN

## 2018-02-16 MED ORDER — HYDROXYZINE HCL 50 MG PO TABS
50.0000 mg | ORAL_TABLET | ORAL | Status: DC | PRN
  Filled 2018-02-16: qty 1

## 2018-02-16 MED ORDER — ACETAMINOPHEN 10 MG/ML IV SOLN
1000.0000 mg | Freq: Once | INTRAVENOUS | Status: AC
  Administered 2018-02-16: 1000 mg via INTRAVENOUS

## 2018-02-16 MED ORDER — THROMBIN 5000 UNITS EX SOLR
OROMUCOSAL | Status: DC | PRN
  Administered 2018-02-16: 14:00:00 via TOPICAL

## 2018-02-16 MED ORDER — DEXAMETHASONE SODIUM PHOSPHATE 10 MG/ML IJ SOLN
INTRAMUSCULAR | Status: AC
  Filled 2018-02-16: qty 1

## 2018-02-16 MED ORDER — BUPIVACAINE HCL (PF) 0.5 % IJ SOLN
INTRAMUSCULAR | Status: DC | PRN
  Administered 2018-02-16: 10 mL

## 2018-02-16 MED ORDER — PROPOFOL 10 MG/ML IV BOLUS
INTRAVENOUS | Status: DC | PRN
  Administered 2018-02-16: 160 mg via INTRAVENOUS
  Administered 2018-02-16: 40 mg via INTRAVENOUS

## 2018-02-16 MED ORDER — EPHEDRINE SULFATE 50 MG/ML IJ SOLN
INTRAMUSCULAR | Status: AC
  Filled 2018-02-16: qty 1

## 2018-02-16 MED ORDER — BACITRACIN 50000 UNITS IM SOLR
INTRAMUSCULAR | Status: DC | PRN
  Administered 2018-02-16: 14:00:00

## 2018-02-16 MED ORDER — LIDOCAINE HCL (CARDIAC) PF 100 MG/5ML IV SOSY
PREFILLED_SYRINGE | INTRAVENOUS | Status: DC | PRN
  Administered 2018-02-16: 100 mg via INTRAVENOUS

## 2018-02-16 MED ORDER — HYDROMORPHONE HCL 1 MG/ML IJ SOLN: 0.2500 mg | INTRAMUSCULAR | Status: DC | PRN

## 2018-02-16 MED ORDER — PHENYLEPHRINE 40 MCG/ML (10ML) SYRINGE FOR IV PUSH (FOR BLOOD PRESSURE SUPPORT)
PREFILLED_SYRINGE | INTRAVENOUS | Status: AC
  Filled 2018-02-16: qty 20

## 2018-02-16 MED ORDER — MIDAZOLAM HCL 2 MG/2ML IJ SOLN
INTRAMUSCULAR | Status: AC
  Filled 2018-02-16: qty 2

## 2018-02-16 MED ORDER — SODIUM CHLORIDE 0.9% FLUSH
3.0000 mL | Freq: Two times a day (BID) | INTRAVENOUS | Status: DC
  Administered 2018-02-16: 3 mL via INTRAVENOUS

## 2018-02-16 MED ORDER — CEFAZOLIN SODIUM-DEXTROSE 2-4 GM/100ML-% IV SOLN
INTRAVENOUS | Status: AC
  Filled 2018-02-16: qty 100

## 2018-02-16 MED ORDER — PHENYLEPHRINE HCL 10 MG/ML IJ SOLN
INTRAVENOUS | Status: DC | PRN
  Administered 2018-02-16: 20 ug/min via INTRAVENOUS

## 2018-02-16 SURGICAL SUPPLY — 62 items
ADH SKN CLS APL DERMABOND .7 (GAUZE/BANDAGES/DRESSINGS) ×1
APL SKNCLS STERI-STRIP NONHPOA (GAUZE/BANDAGES/DRESSINGS)
BAG DECANTER FOR FLEXI CONT (MISCELLANEOUS) ×2 IMPLANT
BENZOIN TINCTURE PRP APPL 2/3 (GAUZE/BANDAGES/DRESSINGS) IMPLANT
BLADE CLIPPER SURG (BLADE) IMPLANT
BUR ACRON 5.0MM COATED (BURR) ×1 IMPLANT
BUR MATCHSTICK NEURO 3.0 LAGG (BURR) ×2 IMPLANT
CANISTER SUCT 3000ML PPV (MISCELLANEOUS) ×2 IMPLANT
CARTRIDGE OIL MAESTRO DRILL (MISCELLANEOUS) ×1 IMPLANT
DECANTER SPIKE VIAL GLASS SM (MISCELLANEOUS) ×2 IMPLANT
DERMABOND ADVANCED (GAUZE/BANDAGES/DRESSINGS) ×1
DERMABOND ADVANCED .7 DNX12 (GAUZE/BANDAGES/DRESSINGS) ×1 IMPLANT
DIFFUSER DRILL AIR PNEUMATIC (MISCELLANEOUS) ×2 IMPLANT
DRAPE LAPAROTOMY 100X72X124 (DRAPES) ×2 IMPLANT
DRAPE MICROSCOPE LEICA (MISCELLANEOUS) ×2 IMPLANT
DRAPE POUCH INSTRU U-SHP 10X18 (DRAPES) ×1 IMPLANT
ELECT REM PT RETURN 9FT ADLT (ELECTROSURGICAL) ×2
ELECTRODE REM PT RTRN 9FT ADLT (ELECTROSURGICAL) ×1 IMPLANT
GAUZE SPONGE 4X4 12PLY STRL (GAUZE/BANDAGES/DRESSINGS) IMPLANT
GAUZE SPONGE 4X4 12PLY STRL LF (GAUZE/BANDAGES/DRESSINGS) ×1 IMPLANT
GAUZE SPONGE 4X4 16PLY XRAY LF (GAUZE/BANDAGES/DRESSINGS) IMPLANT
GLOVE BIO SURGEON STRL SZ8 (GLOVE) ×1 IMPLANT
GLOVE BIO SURGEON STRL SZ8.5 (GLOVE) ×1 IMPLANT
GLOVE BIOGEL PI IND STRL 8 (GLOVE) ×1 IMPLANT
GLOVE BIOGEL PI INDICATOR 8 (GLOVE) ×1
GLOVE ECLIPSE 7.5 STRL STRAW (GLOVE) ×2 IMPLANT
GLOVE EXAM NITRILE LRG STRL (GLOVE) IMPLANT
GLOVE EXAM NITRILE XL STR (GLOVE) IMPLANT
GLOVE EXAM NITRILE XS STR PU (GLOVE) IMPLANT
GOWN STRL REUS W/ TWL LRG LVL3 (GOWN DISPOSABLE) IMPLANT
GOWN STRL REUS W/ TWL XL LVL3 (GOWN DISPOSABLE) ×1 IMPLANT
GOWN STRL REUS W/TWL 2XL LVL3 (GOWN DISPOSABLE) ×1 IMPLANT
GOWN STRL REUS W/TWL LRG LVL3 (GOWN DISPOSABLE)
GOWN STRL REUS W/TWL XL LVL3 (GOWN DISPOSABLE) ×4
HEMOSTAT POWDER KIT SURGIFOAM (HEMOSTASIS) ×1 IMPLANT
KIT BASIN OR (CUSTOM PROCEDURE TRAY) ×2 IMPLANT
KIT TURNOVER KIT B (KITS) ×2 IMPLANT
NDL HYPO 18GX1.5 BLUNT FILL (NEEDLE) IMPLANT
NDL SPNL 18GX3.5 QUINCKE PK (NEEDLE) ×1 IMPLANT
NDL SPNL 22GX3.5 QUINCKE BK (NEEDLE) ×1 IMPLANT
NEEDLE HYPO 18GX1.5 BLUNT FILL (NEEDLE) ×2 IMPLANT
NEEDLE SPNL 18GX3.5 QUINCKE PK (NEEDLE) ×4 IMPLANT
NEEDLE SPNL 22GX3.5 QUINCKE BK (NEEDLE) ×2 IMPLANT
NS IRRIG 1000ML POUR BTL (IV SOLUTION) ×2 IMPLANT
OIL CARTRIDGE MAESTRO DRILL (MISCELLANEOUS) ×2
PACK LAMINECTOMY NEURO (CUSTOM PROCEDURE TRAY) ×2 IMPLANT
PAD ARMBOARD 7.5X6 YLW CONV (MISCELLANEOUS) ×6 IMPLANT
PATTIES SURGICAL .5 X1 (DISPOSABLE) IMPLANT
RUBBERBAND STERILE (MISCELLANEOUS) ×4 IMPLANT
SPONGE LAP 4X18 RFD (DISPOSABLE) IMPLANT
SPONGE SURGIFOAM ABS GEL SZ50 (HEMOSTASIS) ×2 IMPLANT
STRIP CLOSURE SKIN 1/2X4 (GAUZE/BANDAGES/DRESSINGS) IMPLANT
SUT PROLENE 6 0 BV (SUTURE) IMPLANT
SUT VIC AB 1 CT1 18XBRD ANBCTR (SUTURE) ×1 IMPLANT
SUT VIC AB 1 CT1 8-18 (SUTURE) ×2
SUT VIC AB 2-0 CP2 18 (SUTURE) ×3 IMPLANT
SUT VIC AB 3-0 SH 8-18 (SUTURE) ×1 IMPLANT
SYR 5ML LL (SYRINGE) ×1 IMPLANT
TAPE CLOTH SURG 4X10 WHT LF (GAUZE/BANDAGES/DRESSINGS) ×1 IMPLANT
TOWEL GREEN STERILE (TOWEL DISPOSABLE) ×2 IMPLANT
TOWEL GREEN STERILE FF (TOWEL DISPOSABLE) ×2 IMPLANT
WATER STERILE IRR 1000ML POUR (IV SOLUTION) ×2 IMPLANT

## 2018-02-16 NOTE — Transfer of Care (Signed)
Immediate Anesthesia Transfer of Care Note  Patient: James Gates  Procedure(s) Performed: LUMBAR ONE-TWO LAMINOTOMY AND MICRODISCECTOMY (Right )  Patient Location: PACU  Anesthesia Type:General  Level of Consciousness: awake, oriented and patient cooperative  Airway & Oxygen Therapy: Patient Spontanous Breathing and Patient connected to face mask oxygen  Post-op Assessment: Report given to RN and Post -op Vital signs reviewed and stable  Post vital signs: Reviewed  Last Vitals:  Vitals Value Taken Time  BP 158/80 02/16/2018  3:08 PM  Temp    Pulse 94 02/16/2018  3:11 PM  Resp 14 02/16/2018  3:11 PM  SpO2 100 % 02/16/2018  3:11 PM  Vitals shown include unvalidated device data.  Last Pain:  Vitals:   02/16/18 1508  TempSrc:   PainSc: (P) 0-No pain         Complications: No apparent anesthesia complications

## 2018-02-16 NOTE — Anesthesia Procedure Notes (Signed)
Procedure Name: Intubation Date/Time: 02/16/2018 1:08 PM Performed by: Jenne Campus, CRNA Pre-anesthesia Checklist: Patient identified, Emergency Drugs available, Suction available and Patient being monitored Patient Re-evaluated:Patient Re-evaluated prior to induction Oxygen Delivery Method: Circle System Utilized Preoxygenation: Pre-oxygenation with 100% oxygen Induction Type: IV induction Ventilation: Mask ventilation without difficulty and Oral airway inserted - appropriate to patient size Laryngoscope Size: Mac and 4 Grade View: Grade I Tube type: Oral Tube size: 7.5 mm Number of attempts: 1 Airway Equipment and Method: Stylet and Oral airway Placement Confirmation: ETT inserted through vocal cords under direct vision,  positive ETCO2 and breath sounds checked- equal and bilateral Secured at: 21 cm Tube secured with: Tape Dental Injury: Teeth and Oropharynx as per pre-operative assessment

## 2018-02-16 NOTE — Anesthesia Preprocedure Evaluation (Addendum)
Anesthesia Evaluation  Patient identified by MRN, date of birth, ID band Patient awake    Reviewed: Allergy & Precautions, H&P , NPO status , Patient's Chart, lab work & pertinent test results  History of Anesthesia Complications (+) PONV  Airway Mallampati: III  TM Distance: >3 FB Neck ROM: Full    Dental no notable dental hx. (+) Teeth Intact, Dental Advisory Given   Pulmonary sleep apnea , former smoker,    Pulmonary exam normal breath sounds clear to auscultation       Cardiovascular negative cardio ROS   Rhythm:Regular Rate:Normal     Neuro/Psych Anxiety negative neurological ROS     GI/Hepatic Neg liver ROS, GERD  Medicated and Controlled,  Endo/Other  negative endocrine ROS  Renal/GU negative Renal ROS  negative genitourinary   Musculoskeletal  (+) Arthritis , Osteoarthritis,    Abdominal   Peds  Hematology negative hematology ROS (+)   Anesthesia Other Findings   Reproductive/Obstetrics negative OB ROS                            Anesthesia Physical Anesthesia Plan  ASA: III  Anesthesia Plan: General   Post-op Pain Management:    Induction: Intravenous  PONV Risk Score and Plan: 4 or greater and Ondansetron, Dexamethasone and Midazolam  Airway Management Planned: Oral ETT  Additional Equipment:   Intra-op Plan:   Post-operative Plan: Extubation in OR  Informed Consent: I have reviewed the patients History and Physical, chart, labs and discussed the procedure including the risks, benefits and alternatives for the proposed anesthesia with the patient or authorized representative who has indicated his/her understanding and acceptance.   Dental advisory given  Plan Discussed with: CRNA  Anesthesia Plan Comments:         Anesthesia Quick Evaluation

## 2018-02-16 NOTE — Progress Notes (Signed)
Vitals:   02/16/18 1508 02/16/18 1523 02/16/18 1540 02/16/18 1617  BP: (!) 158/80 130/71  137/83  Pulse: (!) 101 88  87  Resp: (!) 25 17  19   Temp: (!) 97.3 F (36.3 C)  (!) 97.5 F (36.4 C) 98.9 F (37.2 C)  TempSrc:    Oral  SpO2: 100% 100%  95%   BMET Recent Labs    02/16/18 1016  NA 138  K 4.2  CL 104  CO2 26  GLUCOSE 107*  BUN 17  CREATININE 1.26*  CALCIUM 9.5    Patient resting comfortably in bed.  Dressing clean and dry.  Has not voided yet.  Has not ambulated in the halls yet.  However good relief of right lumbar radicular pain.  Plan: Doing well following surgery.  Encouraged to ambulate.  Hosie Spangle, MD 02/16/2018, 6:58 PM

## 2018-02-16 NOTE — Anesthesia Postprocedure Evaluation (Signed)
Anesthesia Post Note  Patient: James Gates  Procedure(s) Performed: LUMBAR ONE-TWO LAMINOTOMY AND MICRODISCECTOMY (Right )     Patient location during evaluation: PACU Anesthesia Type: General Level of consciousness: awake and alert Pain management: pain level controlled Vital Signs Assessment: post-procedure vital signs reviewed and stable Respiratory status: spontaneous breathing, nonlabored ventilation, respiratory function stable and patient connected to nasal cannula oxygen Cardiovascular status: blood pressure returned to baseline and stable Postop Assessment: no apparent nausea or vomiting Anesthetic complications: no    Last Vitals:  Vitals:   02/16/18 1523 02/16/18 1540  BP: 130/71   Pulse: 88   Resp: 17   Temp:  (!) 36.4 C  SpO2: 100%     Last Pain:  Vitals:   02/16/18 1540  TempSrc:   PainSc: 0-No pain                 [name],W. EDMOND

## 2018-02-16 NOTE — Op Note (Signed)
02/16/2018  2:45 PM  PATIENT:  James Gates  67 y.o. 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    ASSISTANTS:  Newman Pies, M.D.   ANESTHESIA:   general  EBL:  Total I/O In: 1000 [I.V.:1000] Out: 50 [Blood:50]  BLOOD ADMINISTERED:none  COUNT:  Correct per nursing staff  DICTATION: Patient was brought to the operating room and placed under general endotracheal anesthesia. Patient was turned to prone position the lumbar region was prepped with Betadine soap and solution and draped in a sterile fashion. The midline was infiltrated with local anesthetic with epinephrine. A localizing x-ray was taken and the L1-2 level was identified. Midline incision was made over the L1-2 level and was carried down through the subcutaneous tissue to the lumbar fascia. The lumbar fascia was incised on the right side and the paraspinal muscles were dissected from the spinous processes and lamina in a subperiosteal fashion. Another x-ray was taken and the L1-2 intralaminar space was identified. The operating microscope was draped and brought into the field provided additional magnification, illumination, and visualization. Laminotomy was performed using the high-speed drill and Kerrison punches. The ligamentum flavum was carefully resected. The underlying thecal sac and nerve root were identified. The disc herniation was identified and the thecal sac and nerve root gently retracted medially.  A free fragment disc herniation was found, it was removed in a piecemeal fashion.  Multiple large fragments were  removed.  In the end all loose fragments were removed from the epidural space, which was explored thoroughly.  It was felt a good decompression of the thecal sac and nerve had been achieved.  It was felt that a intradiscal discectomy would not contribute to the decompression.  Hemostasis was established with the use of Gelfoam with thrombin. The Gelfoam was removed and hemostasis confirmed. We then instilled 2 cc of fentanyl and 80 mg of Depo-Medrol into the epidural space. Deep fascia was closed with interrupted undyed 1 Vicryl sutures. Scarpa's fascia was closed with interrupted undyed 1 Vicryl sutures in the subcutaneous and subcuticular layer were closed with interrupted inverted 2-0 undyed Vicryl sutures. The skin edges were approximated with Dermabond.  Dressing of sterile gauze and Hypafix was applied.  Following surgery the patient was turned back to a supine position to be reversed from the anesthetic extubated and transferred to the recovery room for further care.  PLAN OF CARE: Admit for overnight observation  PATIENT DISPOSITION:  PACU - hemodynamically stable.   Delay start of Pharmacological VTE agent (>24hrs) due to surgical blood loss or risk of bleeding:  yes

## 2018-02-16 NOTE — H&P (Signed)
Subjective: Patient is a 67 y.o. right-handed black male who is admitted for treatment of right L1-2 lumbar disc herniation with significant thecal sac and nerve root compression.  Patient is status post an L2-L5 decompressive lumbar laminectomy in April 2017.  He did well from that surgery.  Fall 2018 he developed acute right-sided low back pain.  Subsequently the pain extended around the hip and into the right thigh.  He developed numbness in the anterior right thigh.  He saw his orthopedist to have his right hip replacement checked, and was felt that the implant was okay.  He was prescribed meloxicam.  He subsequently followed up at the [facility] [place], and MRI scan was done 2 months ago, he was prescribed a 10-day prednisone Dosepak with some symptomatic improvement.  However he is continued to have pain from the right of his low back rating around the lateral right hip, and into the right groin and anterior right thigh with numbness in the anterior right thigh.  Her logic examination showed weakness of the right iliopsoas.  MRI scan shows a right L1-2 lumbar disc herniation, and is admitted now for a right L1-2 lumbar laminotomy and microdiscectomy.   Patient Active Problem List   Diagnosis Date Noted  . Lumbar stenosis with neurogenic claudication 12/20/2015  . Primary osteoarthritis of right hip 04/23/2015  . Enlarged prostate 12/14/2014  . Obstructive apnea 12/14/2014  . Tinea cruris 01/24/2011  . DERMATITIS, SEBORRHEIC NEC 05/21/2007  . ACTINIC KERATOSIS, HEAD 05/21/2007  . SPINAL STENOSIS, LUMBAR 05/21/2007  . COLONIC POLYPS, HX OF 05/21/2007  . HYPERLIPIDEMIA 05/14/2007   Past Medical History:  Diagnosis Date  . Arthritis   . BPH (benign prostatic hypertrophy) with urinary retention   . Colon polyps   . GERD (gastroesophageal reflux disease)   . History of BPH   . Hyperlipidemia   . Numbness and tingling of both legs   . PONV (postoperative nausea and vomiting)   . Pre-diabetes   . PTSD  (post-traumatic stress disorder)    [facility]; being consulted at the [facility] in [place].   . Sleep apnea    cpap  . Sleep apnea, obstructive 2011   sleep study done @ [facility]; does not wear CPAP at this moment  . Spinal stenosis of lumbar region   . Wears partial dentures     Past Surgical History:  Procedure Laterality Date  . COLONOSCOPY    . COLONOSCOPY W/ POLYPECTOMY    . CYST EXCISION     on back  . LUMBAR LAMINECTOMY/DECOMPRESSION MICRODISCECTOMY N/A 12/20/2015   Procedure: Lumbar two-Lumbar five Decompressive Laminectomy;  Surgeon: Jovita Gamma, MD;  Location: [facility];  Service: Neurosurgery;  Laterality: N/A;  . POLYPECTOMY    . TOTAL HIP ARTHROPLASTY Right 04/24/2015   Procedure: TOTAL HIP ARTHROPLASTY ANTERIOR APPROACH;  Surgeon: Frederik Pear, MD;  Location: [facility];  Service: Orthopedics;  Laterality: Right;    No medications prior to admission.   No Known Allergies  Social History   Tobacco Use  . Smoking status: Former Smoker    Last attempt to quit: 08/26/1981    Years since quitting: 36.5  . Smokeless tobacco: Never Used  Substance Use Topics  . Alcohol use: Yes    Alcohol/week: 3.6 oz    Types: 6 Standard drinks or equivalent per week    Family History  Problem Relation Age of Onset  . Lung cancer Father   . Ulcers Unknown   . Colon cancer Neg Hx  Review of Systems Pertinent items noted in HPI and remainder of comprehensive ROS otherwise negative.  Objective: Vital signs in last 24 hours:    EXAM: Patient is well-developed well-nourished black male in no acute distress.   Lungs are clear to auscultation , the patient has symmetrical respiratory excursion. Heart has a regular rate and rhythm normal S1 and S2 no murmur.   Abdomen is soft nontender nondistended bowel sounds are present. Extremity examination shows no clubbing cyanosis or edema. Logic examination shows the left iliopsoas is 5, right iliopsoas is 4.  The remainder of the lower  extremity strength is 5/5 including the quadriceps, dorsi flexor, EHL, and plantar flexor bilaterally.  Sensation is intact to pinprick to the thighs, legs, and feet bilaterally.  Reflex examination shows a left quadriceps 2, the right quadriceps is minimal.  Gastric limits are absent belly.  Toes are downgoing belly.  His gait and stance show a flexed forward posture.  He has a positive femoral stretch maneuver on the right, negative femoral stretch maneuver on the left.  Data Review:CBC    Component Value Date/Time   WBC 5.4 02/10/2018 1053   RBC 5.41 02/10/2018 1053   HGB 15.6 02/10/2018 1053   HCT 47.8 02/10/2018 1053   PLT 160 02/10/2018 1053   MCV 88.4 02/10/2018 1053   MCH 28.8 02/10/2018 1053   MCHC 32.6 02/10/2018 1053   RDW 12.9 02/10/2018 1053   LYMPHSABS 1.9 04/13/2015 1125   MONOABS 0.5 04/13/2015 1125   EOSABS 0.3 04/13/2015 1125   BASOSABS 0.0 04/13/2015 1125                          BMET    Component Value Date/Time   NA 140 12/12/2015 1042   K 4.2 12/12/2015 1042   CL 105 12/12/2015 1042   CO2 26 12/12/2015 1042   GLUCOSE 104 (H) 12/12/2015 1042   BUN 17 12/12/2015 1042   CREATININE 1.37 (H) 12/12/2015 1042   CALCIUM 9.7 12/12/2015 1042   GFRNONAA 52 (L) 12/12/2015 1042   GFRAA >60 12/12/2015 1042     Assessment/Plan: Patient with right lumbar radiculopathy with right iliopsoas weakness who has a right L1-2 lumbar disc herniation and is admitted now for a right L1-2 lumbar laminotomy and microdiscectomy.  I've discussed with the patient the nature of his condition, the nature the surgical procedure, the typical length of surgery, hospital stay, and overall recuperation. We discussed limitations postoperatively. I discussed risks of surgery including risks of infection, bleeding, possibly need for transfusion, the risk of nerve root dysfunction with pain, weakness, numbness, or paresthesias, or risk of dural tear and CSF leakage and possible need for further  surgery, the risk of recurrent disc herniation and the possible need for further surgery, and the risk of anesthetic complications including myocardial infarction, stroke, pneumonia, and death. Understanding all this the patient does wish to proceed with surgery and is admitted for such.    Hosie Spangle, MD 02/16/2018 7:31 AM

## 2018-02-17 ENCOUNTER — Encounter (HOSPITAL_COMMUNITY): Payer: Self-pay | Admitting: Neurosurgery

## 2018-02-17 DIAGNOSIS — M5116 Intervertebral disc disorders with radiculopathy, lumbar region: Secondary | ICD-10-CM | POA: Diagnosis not present

## 2018-02-17 MED ORDER — HYDROCODONE-ACETAMINOPHEN 5-325 MG PO TABS: 0.5000 | ORAL_TABLET | ORAL | 0 refills | Status: DC | PRN

## 2018-02-17 NOTE — Discharge Summary (Signed)
Physician Discharge Summary  Patient ID: James Gates MRN: [id number] DOB/AGE: 1950/11/12 67 y.o.  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  Postoperatively he has had good relief of the radicular pain.  His incision is healing nicely.  He is up and ambulating actively.  He is voiding well.  We are discharging him to home with instructions regarding wound care and activities.  He is scheduled for follow-up with me in the office in about 3 weeks.  Discharge Exam: Blood pressure 128/79, pulse 79, temperature 97.9 F (36.6 C), temperature source Oral, resp. rate 18, SpO2 96 %.  Disposition: Discharge disposition: 01-Home or Self Care   Discharge Instructions    Discharge wound care:   Complete by:  As directed    Leave the wound open to air. Shower daily with the wound uncovered. Water and soapy water should run over the incision area. Do not wash directly on the incision for 2 weeks. Remove the glue after 2 weeks.   Driving Restrictions   Complete by:  As directed    No driving for 2 weeks. May ride in the car locally now. May begin to drive locally in 2 weeks.   Other Restrictions   Complete by:  As directed    Walk gradually increasing distances out in the fresh air at least twice a day. Walking additional 6 times inside the house, gradually increasing distances, daily. No bending, lifting, or twisting. Perform activities between shoulder and waist height (that is at counter height when standing or table height when sitting).      Allergies as of 02/17/2018   No Known Allergies     Medication List    TAKE these medications   aspirin 81 MG tablet Take 81 mg by mouth daily.   HYDROcodone-acetaminophen 5-325 MG tablet Commonly known as:  NORCO/VICODIN Take 0.5-1 tablets by mouth every 4 (four) hours as needed (pain).   KRILL OIL PO Take 1 capsule by mouth daily.   naproxen sodium 220 MG tablet Commonly known as:  ALEVE Take 220 mg by mouth 2 (two) times daily as needed (pain).   tamsulosin 0.4 MG Caps capsule Commonly known as:  FLOMAX Take 0.8 mg by mouth at bedtime.   TURMERIC PO Take 1 capsule by mouth daily.            Discharge Care Instructions  (From admission, onward)        Start     Ordered   02/17/18 0000  Discharge wound care:    Comments:  Leave the wound open to air. Shower daily with the wound uncovered. Water and soapy water should run over the incision area. Do not wash directly on the incision for 2 weeks. Remove the glue after 2 weeks.   02/17/18 4825       Signed: Hosie Spangle 02/17/2018, 8:35 AM

## 2018-02-17 NOTE — Progress Notes (Signed)
Patient alert and oriented, mae's well, voiding adequate amount of urine, swallowing without difficulty, no c/o pain at time of discharge. Patient discharged home with family. Script and discharged instructions given to patient. Patient and family stated understanding of instructions given. Patient has an appointment with Dr. Nudelman 

## 2018-11-20 ENCOUNTER — Telehealth: Payer: Self-pay

## 2018-11-20 NOTE — Telephone Encounter (Signed)
   Cardiac Questionnaire:    Since your last visit or hospitalization:    1. Have you been having new or worsening chest pain? No   2. Have you been having new or worsening shortness of breath? no 3. Have you been having new or worsening leg swelling, wt gain, or increase in abdominal girth (pants fitting more tightly)? No   4. Have you had any passing out spells? no          Primary Cardiologist:  Dr.Kelly  Patient contacted.  History reviewed.  No symptoms to suggest any unstable cardiac conditions.  Based on discussion, with current pandemic situation, we will be postponing this appointment for James Gates with a plan for f/u in 12 wks or sooner if feasible/necessary.  If symptoms change, he has been instructed to contact our office.   Routing to C19 CANCEL pool for tracking (P CV DIV CV19 CANCEL - reason for visit "other.") and assigning priority (1 = 4-6 wks, 2 = 6-12 wks, 3 = >12 wks).   Priority 3  Ena Dawley, LPN  3/41/9379 02:40 AM         .

## 2018-11-24 ENCOUNTER — Ambulatory Visit: Payer: Medicare Other | Admitting: Cardiovascular Disease

## 2019-08-01 ENCOUNTER — Emergency Department (HOSPITAL_COMMUNITY): Payer: Medicare Other

## 2019-08-01 ENCOUNTER — Inpatient Hospital Stay (HOSPITAL_COMMUNITY)
Admission: EM | Admit: 2019-08-01 | Discharge: 2019-08-07 | DRG: 872 | Disposition: A | Discharge status: Home Health | Payer: Medicare Other | Attending: Internal Medicine | Admitting: Internal Medicine

## 2019-08-01 ENCOUNTER — Other Ambulatory Visit: Payer: Self-pay

## 2019-08-01 ENCOUNTER — Encounter (HOSPITAL_COMMUNITY): Payer: Self-pay | Admitting: Radiology

## 2019-08-01 DIAGNOSIS — A419 Sepsis, unspecified organism: Secondary | ICD-10-CM | POA: Diagnosis not present

## 2019-08-01 DIAGNOSIS — R338 Other retention of urine: Secondary | ICD-10-CM | POA: Diagnosis present

## 2019-08-01 DIAGNOSIS — E872 Acidosis, unspecified: Secondary | ICD-10-CM | POA: Diagnosis present

## 2019-08-01 DIAGNOSIS — N401 Enlarged prostate with lower urinary tract symptoms: Secondary | ICD-10-CM | POA: Diagnosis present

## 2019-08-01 DIAGNOSIS — N390 Urinary tract infection, site not specified: Secondary | ICD-10-CM | POA: Diagnosis present

## 2019-08-01 DIAGNOSIS — R269 Unspecified abnormalities of gait and mobility: Secondary | ICD-10-CM | POA: Diagnosis present

## 2019-08-01 DIAGNOSIS — A411 Sepsis due to other specified staphylococcus: Principal | ICD-10-CM | POA: Diagnosis present

## 2019-08-01 DIAGNOSIS — R197 Diarrhea, unspecified: Secondary | ICD-10-CM | POA: Diagnosis present

## 2019-08-01 DIAGNOSIS — R42 Dizziness and giddiness: Secondary | ICD-10-CM | POA: Diagnosis present

## 2019-08-01 DIAGNOSIS — Z7982 Long term (current) use of aspirin: Secondary | ICD-10-CM

## 2019-08-01 DIAGNOSIS — Z801 Family history of malignant neoplasm of trachea, bronchus and lung: Secondary | ICD-10-CM

## 2019-08-01 DIAGNOSIS — K219 Gastro-esophageal reflux disease without esophagitis: Secondary | ICD-10-CM | POA: Diagnosis present

## 2019-08-01 DIAGNOSIS — R7303 Prediabetes: Secondary | ICD-10-CM | POA: Diagnosis present

## 2019-08-01 DIAGNOSIS — Z8719 Personal history of other diseases of the digestive system: Secondary | ICD-10-CM

## 2019-08-01 DIAGNOSIS — Z87891 Personal history of nicotine dependence: Secondary | ICD-10-CM

## 2019-08-01 DIAGNOSIS — Z20828 Contact with and (suspected) exposure to other viral communicable diseases: Secondary | ICD-10-CM | POA: Diagnosis present

## 2019-08-01 DIAGNOSIS — E860 Dehydration: Secondary | ICD-10-CM | POA: Diagnosis present

## 2019-08-01 DIAGNOSIS — Z96641 Presence of right artificial hip joint: Secondary | ICD-10-CM | POA: Diagnosis present

## 2019-08-01 DIAGNOSIS — Z79899 Other long term (current) drug therapy: Secondary | ICD-10-CM

## 2019-08-01 DIAGNOSIS — E785 Hyperlipidemia, unspecified: Secondary | ICD-10-CM | POA: Diagnosis present

## 2019-08-01 LAB — PROCALCITONIN: Procalcitonin: <0.1 ng/mL

## 2019-08-01 LAB — GLUCOSE, CAPILLARY: Glucose-Capillary: 106 mg/dL — ABNORMAL HIGH (ref 70–99)

## 2019-08-01 LAB — COMPREHENSIVE METABOLIC PANEL
ALT: 39 U/L (ref 0–44)
AST: 37 U/L (ref 15–41)
Albumin: 4.5 g/dL (ref 3.5–5.0)
Alkaline Phosphatase: 70 U/L (ref 38–126)
Anion gap: 14 (ref 5–15)
BUN: 19 mg/dL (ref 8–23)
CO2: 21 mmol/L — ABNORMAL LOW (ref 22–32)
Calcium: 9.1 mg/dL (ref 8.9–10.3)
Chloride: 107 mmol/L (ref 98–111)
Creatinine, Ser: 1.27 mg/dL — ABNORMAL HIGH (ref 0.61–1.24)
GFR calc Af Amer: >60 mL/min (ref >60)
GFR calc non Af Amer: 57 mL/min — ABNORMAL LOW (ref >60)
Glucose, Bld: 210 mg/dL — ABNORMAL HIGH (ref 70–99)
Potassium: 3.3 mmol/L — ABNORMAL LOW (ref 3.5–5.1)
Sodium: 142 mmol/L (ref 135–145)
Total Bilirubin: 1.5 mg/dL — ABNORMAL HIGH (ref 0.3–1.2)
Total Protein: 7.5 g/dL (ref 6.5–8.1)

## 2019-08-01 LAB — CBC WITH DIFFERENTIAL/PLATELET
Abs Immature Granulocytes: 0.05 10*3/uL (ref 0.00–0.07)
Basophils Absolute: 0 10*3/uL (ref 0.0–0.1)
Basophils Relative: 0 %
Eosinophils Absolute: 0 10*3/uL (ref 0.0–0.5)
Eosinophils Relative: 0 %
HCT: 49.2 % (ref 39.0–52.0)
Hemoglobin: 15.7 g/dL (ref 13.0–17.0)
Immature Granulocytes: 0 %
Lymphocytes Relative: 8 %
Lymphs Abs: 1.1 10*3/uL (ref 0.7–4.0)
MCH: 29.6 pg (ref 26.0–34.0)
MCHC: 31.9 g/dL (ref 30.0–36.0)
MCV: 92.8 fL (ref 80.0–100.0)
Monocytes Absolute: 0.9 10*3/uL (ref 0.1–1.0)
Monocytes Relative: 7 %
Neutro Abs: 10.8 10*3/uL — ABNORMAL HIGH (ref 1.7–7.7)
Neutrophils Relative %: 85 %
Platelets: 154 10*3/uL (ref 150–400)
RBC: 5.3 MIL/uL (ref 4.22–5.81)
RDW: 12.8 % (ref 11.5–15.5)
WBC: 12.8 10*3/uL — ABNORMAL HIGH (ref 4.0–10.5)
nRBC: 0 % (ref 0.0–0.2)

## 2019-08-01 LAB — LACTIC ACID, PLASMA
Lactic Acid, Venous: 7.5 mmol/L (ref 0.5–1.9)
Lactic Acid, Venous: 4.3 mmol/L (ref 0.5–1.9)
Lactic Acid, Venous: 2.8 mmol/L (ref 0.5–1.9)

## 2019-08-01 LAB — URINALYSIS, ROUTINE W REFLEX MICROSCOPIC
Bilirubin Urine: NEGATIVE
Glucose, UA: 50 mg/dL — AB
Hgb urine dipstick: NEGATIVE
Ketones, ur: 5 mg/dL — AB
Nitrite: NEGATIVE
Protein, ur: NEGATIVE mg/dL
Specific Gravity, Urine: 1.017 (ref 1.005–1.030)
pH: 7 (ref 5.0–8.0)

## 2019-08-01 LAB — POC SARS CORONAVIRUS 2 AG -  ED: SARS Coronavirus 2 Ag: NEGATIVE

## 2019-08-01 LAB — FIBRINOGEN: Fibrinogen: 298 mg/dL (ref 210–475)

## 2019-08-01 LAB — C-REACTIVE PROTEIN: CRP: 0.7 mg/dL (ref <1.0)

## 2019-08-01 LAB — CBG MONITORING, ED: Glucose-Capillary: 209 mg/dL — ABNORMAL HIGH (ref 70–99)

## 2019-08-01 LAB — SARS CORONAVIRUS 2 (TAT 6-24 HRS): SARS Coronavirus 2: NEGATIVE

## 2019-08-01 LAB — D-DIMER, QUANTITATIVE: D-Dimer, Quant: 0.42 ug/mL-FEU (ref 0.00–0.50)

## 2019-08-01 LAB — FERRITIN: Ferritin: 197 ng/mL (ref 24–336)

## 2019-08-01 LAB — LACTATE DEHYDROGENASE: LDH: 128 U/L (ref 98–192)

## 2019-08-01 MED ORDER — ONDANSETRON HCL 4 MG/2ML IJ SOLN: 4.0000 mg | Freq: Four times a day (QID) | INTRAMUSCULAR | Status: DC | PRN

## 2019-08-01 MED ORDER — TAMSULOSIN HCL 0.4 MG PO CAPS
0.8000 mg | ORAL_CAPSULE | Freq: Every day | ORAL | Status: DC
  Administered 2019-08-01 – 2019-08-06 (×6): 0.8 mg via ORAL
  Filled 2019-08-01 (×6): qty 2

## 2019-08-01 MED ORDER — ONDANSETRON HCL 4 MG PO TABS: 4.0000 mg | ORAL_TABLET | Freq: Four times a day (QID) | ORAL | Status: DC | PRN

## 2019-08-01 MED ORDER — LACTATED RINGERS IV BOLUS
1800.0000 mL | Freq: Once | INTRAVENOUS | Status: AC
  Administered 2019-08-01: 17:00:00 1800 mL via INTRAVENOUS

## 2019-08-01 MED ORDER — IOHEXOL 300 MG/ML  SOLN
100.0000 mL | Freq: Once | INTRAMUSCULAR | Status: AC | PRN
  Administered 2019-08-01: 14:00:00 100 mL via INTRAVENOUS

## 2019-08-01 MED ORDER — SODIUM CHLORIDE 0.9 % IV SOLN
1000.0000 mL | INTRAVENOUS | Status: DC
  Administered 2019-08-01: 1000 mL via INTRAVENOUS

## 2019-08-01 MED ORDER — INSULIN ASPART 100 UNIT/ML ~~LOC~~ SOLN
0.0000 [IU] | Freq: Three times a day (TID) | SUBCUTANEOUS | Status: DC
  Administered 2019-08-03 – 2019-08-06 (×3): 1 [IU] via SUBCUTANEOUS

## 2019-08-01 MED ORDER — SODIUM CHLORIDE 0.9 % IV BOLUS
1000.0000 mL | Freq: Once | INTRAVENOUS | Status: AC
  Administered 2019-08-01: 1000 mL via INTRAVENOUS

## 2019-08-01 MED ORDER — SODIUM CHLORIDE 0.9 % IV SOLN
INTRAVENOUS | Status: AC
  Administered 2019-08-01 – 2019-08-02 (×2): via INTRAVENOUS

## 2019-08-01 MED ORDER — ACETAMINOPHEN 650 MG RE SUPP: 650.0000 mg | Freq: Four times a day (QID) | RECTAL | Status: DC | PRN

## 2019-08-01 MED ORDER — ENOXAPARIN SODIUM 40 MG/0.4ML ~~LOC~~ SOLN
40.0000 mg | SUBCUTANEOUS | Status: DC
  Administered 2019-08-01 – 2019-08-06 (×6): 40 mg via SUBCUTANEOUS
  Filled 2019-08-01 (×6): qty 0.4

## 2019-08-01 MED ORDER — PROMETHAZINE HCL 25 MG/ML IJ SOLN
12.5000 mg | Freq: Once | INTRAMUSCULAR | Status: AC
  Administered 2019-08-01: 12.5 mg via INTRAVENOUS
  Filled 2019-08-01: qty 1

## 2019-08-01 MED ORDER — ACETAMINOPHEN 325 MG PO TABS: 650.0000 mg | ORAL_TABLET | Freq: Four times a day (QID) | ORAL | Status: DC | PRN

## 2019-08-01 MED ORDER — ONDANSETRON HCL 4 MG/2ML IJ SOLN
4.0000 mg | Freq: Once | INTRAMUSCULAR | Status: AC
  Administered 2019-08-01: 4 mg via INTRAVENOUS
  Filled 2019-08-01: qty 2

## 2019-08-01 MED ORDER — PIPERACILLIN-TAZOBACTAM 3.375 G IVPB 30 MIN
3.3750 g | Freq: Once | INTRAVENOUS | Status: AC
  Administered 2019-08-01: 12:00:00 3.375 g via INTRAVENOUS
  Filled 2019-08-01: qty 50

## 2019-08-01 MED ORDER — INSULIN ASPART 100 UNIT/ML ~~LOC~~ SOLN: 0.0000 [IU] | Freq: Every day | SUBCUTANEOUS | Status: DC

## 2019-08-01 MED ORDER — SODIUM CHLORIDE 0.9 % IV SOLN
1.0000 g | INTRAVENOUS | Status: DC
  Administered 2019-08-01 – 2019-08-04 (×4): 1 g via INTRAVENOUS
  Filled 2019-08-01: qty 1
  Filled 2019-08-01: qty 10
  Filled 2019-08-01 (×2): qty 1

## 2019-08-01 MED ORDER — ASPIRIN 81 MG PO CHEW
81.0000 mg | CHEWABLE_TABLET | Freq: Every day | ORAL | Status: DC
  Administered 2019-08-01 – 2019-08-07 (×7): 81 mg via ORAL
  Filled 2019-08-01 (×7): qty 1

## 2019-08-01 NOTE — ED Provider Notes (Signed)
[facility] DEPT Provider Note   CSN: [id number] Arrival date & time: 08/01/19  0820     History   Chief Complaint No chief complaint on file. vomiting  HPI James Gates is a 68 y.o. male.  He has a history of borderline diabetes.  He said he has been feeling generally weak and lightheaded for about 1 week.  He had some runny nose and felt like he had a head cold.  Overnight he felt very hot and then experienced nausea and diarrhea and a little bit of retching.  He has had a minimal dry cough.  No sick contacts at home.  No documented fever.  Not short of breath.     The history is provided by the patient.  Diarrhea Quality:  Watery Severity:  Moderate Onset quality:  Sudden Number of episodes:  Few Progression:  Unchanged Relieved by:  None tried Worsened by:  Nothing Ineffective treatments:  None tried Associated symptoms: cough, diaphoresis, fever (subjective), URI and vomiting   Associated symptoms: no abdominal pain, no chills and no headaches   Risk factors: no recent antibiotic use, no sick contacts, no suspicious food intake and no travel to endemic areas     Past Medical History:  Diagnosis Date   Arthritis    BPH (benign prostatic hypertrophy) with urinary retention    Colon polyps    GERD (gastroesophageal reflux disease)    History of BPH    Hyperlipidemia    Numbness and tingling of both legs    PONV (postoperative nausea and vomiting)    Pre-diabetes    PTSD (post-traumatic stress disorder)    [facility]; being consulted at the [facility] in [place].    Sleep apnea    cpap   Sleep apnea, obstructive 2011   sleep study done @ [place]; does not wear CPAP at this moment   Spinal stenosis of lumbar region    Wears partial dentures     Patient Active Problem List   Diagnosis Date Noted   HNP (herniated nucleus pulposus), lumbar 02/16/2018   Lumbar stenosis with neurogenic claudication 12/20/2015    Primary osteoarthritis of right hip 04/23/2015   Enlarged prostate 12/14/2014   Obstructive apnea 12/14/2014   Tinea cruris 01/24/2011   DERMATITIS, SEBORRHEIC NEC 05/21/2007   ACTINIC KERATOSIS, HEAD 05/21/2007   SPINAL STENOSIS, LUMBAR 05/21/2007   COLONIC POLYPS, HX OF 05/21/2007   HYPERLIPIDEMIA 05/14/2007    Past Surgical History:  Procedure Laterality Date   COLONOSCOPY     COLONOSCOPY W/ POLYPECTOMY     CYST EXCISION     on back   LUMBAR LAMINECTOMY/DECOMPRESSION MICRODISCECTOMY N/A 12/20/2015   Procedure: Lumbar two-Lumbar five Decompressive Laminectomy;  Surgeon: Jovita Gamma, MD;  Location: [facility];  Service: Neurosurgery;  Laterality: N/A;   LUMBAR LAMINECTOMY/DECOMPRESSION MICRODISCECTOMY Right 02/16/2018   Procedure: LUMBAR ONE-TWO LAMINOTOMY AND MICRODISCECTOMY;  Surgeon: Jovita Gamma, MD;  Location: [facility];  Service: Neurosurgery;  Laterality: Right;  LUMBAR ONE-TWO LAMINOTOMY AND MICRODISCECTOMY   POLYPECTOMY     TOTAL HIP ARTHROPLASTY Right 04/24/2015   Procedure: TOTAL HIP ARTHROPLASTY ANTERIOR APPROACH;  Surgeon: Frederik Pear, MD;  Location: [facility];  Service: Orthopedics;  Laterality: Right;        Home Medications    Prior to Admission medications   Medication Sig Start Date End Date Taking? Authorizing Provider  aspirin 81 MG tablet Take 81 mg by mouth daily.    [provider]  HYDROcodone-acetaminophen (NORCO/VICODIN) 5-325 MG tablet  Take 0.5-1 tablets by mouth every 4 (four) hours as needed (pain). 02/17/18   Jovita Gamma, MD  KRILL OIL PO Take 1 capsule by mouth daily.     [provider]  naproxen sodium (ANAPROX) 220 MG tablet Take 220 mg by mouth 2 (two) times daily as needed (pain).    [provider]  tamsulosin (FLOMAX) 0.4 MG CAPS capsule Take 0.8 mg by mouth at bedtime.     [provider]  TURMERIC PO Take 1 capsule by mouth daily.     [provider]    Family  History Family History  Problem Relation Age of Onset   Lung cancer Father    Ulcers Unknown    Colon cancer Neg Hx     Social History Social History   Tobacco Use   Smoking status: Former Smoker    Quit date: 08/26/1981    Years since quitting: 37.9   Smokeless tobacco: Never Used  Substance Use Topics   Alcohol use: Yes    Alcohol/week: 6.0 standard drinks    Types: 6 Standard drinks or equivalent per week   Drug use: No     Allergies   Patient has no known allergies.   Review of Systems Review of Systems  Constitutional: Positive for diaphoresis and fever (subjective). Negative for chills.  HENT: Positive for rhinorrhea. Negative for sore throat.   Eyes: Negative for visual disturbance.  Respiratory: Positive for cough. Negative for shortness of breath.   Cardiovascular: Negative for chest pain.  Gastrointestinal: Positive for diarrhea, nausea and vomiting. Negative for abdominal pain and blood in stool.  Genitourinary: Negative for dysuria.  Musculoskeletal: Negative for neck pain.  Skin: Negative for rash.  Neurological: Positive for light-headedness. Negative for headaches.     Physical Exam Updated Vital Signs BP 133/72 (BP Location: Right Arm)    Pulse 89    Temp 98 F (36.7 C) (Oral)    Resp 16    Ht 5\' 9"  (1.753 m)    Wt 96.2 kg    SpO2 96%    BMI 31.31 kg/m   Physical Exam Vitals signs and nursing note reviewed.  Constitutional:      Appearance: He is well-developed.  HENT:     Head: Normocephalic and atraumatic.  Eyes:     Conjunctiva/sclera: Conjunctivae normal.  Neck:     Musculoskeletal: Neck supple.  Cardiovascular:     Rate and Rhythm: Normal rate and regular rhythm.     Heart sounds: No murmur.  Pulmonary:     Effort: Pulmonary effort is normal. No respiratory distress.     Breath sounds: Normal breath sounds.  Abdominal:     Palpations: Abdomen is soft.     Tenderness: There is no abdominal tenderness.  Musculoskeletal:  Normal range of motion.     Right lower leg: No edema.     Left lower leg: No edema.  Skin:    General: Skin is warm and dry.     Capillary Refill: Capillary refill takes less than 2 seconds.  Neurological:     General: No focal deficit present.     Mental Status: He is alert and oriented to person, place, and time.      ED Treatments / Results  Labs (all labs ordered are listed, but only abnormal results are displayed) Labs Reviewed  LACTIC ACID, PLASMA - Abnormal; Notable for the following components:      Result Value   Lactic Acid, Venous 7.5 (*)  All other components within normal limits  CBC WITH DIFFERENTIAL/PLATELET - Abnormal; Notable for the following components:   WBC 12.8 (*)    Neutro Abs 10.8 (*)    All other components within normal limits  COMPREHENSIVE METABOLIC PANEL - Abnormal; Notable for the following components:   Potassium 3.3 (*)    CO2 21 (*)    Glucose, Bld 210 (*)    Creatinine, Ser 1.27 (*)    Total Bilirubin 1.5 (*)    GFR calc non Af Amer 57 (*)    All other components within normal limits  URINALYSIS, ROUTINE W REFLEX MICROSCOPIC - Abnormal; Notable for the following components:   APPearance HAZY (*)    Glucose, UA 50 (*)    Ketones, ur 5 (*)    Leukocytes,Ua LARGE (*)    Bacteria, UA FEW (*)    All other components within normal limits  LACTIC ACID, PLASMA - Abnormal; Notable for the following components:   Lactic Acid, Venous 4.3 (*)    All other components within normal limits  CBG MONITORING, ED - Abnormal; Notable for the following components:   Glucose-Capillary 209 (*)    All other components within normal limits  CULTURE, BLOOD (ROUTINE X 2)  CULTURE, BLOOD (ROUTINE X 2)  SARS CORONAVIRUS 2 (TAT 6-24 HRS)  URINE CULTURE  D-DIMER, QUANTITATIVE (NOT AT [facility])  PROCALCITONIN  LACTATE DEHYDROGENASE  FERRITIN  FIBRINOGEN  C-REACTIVE PROTEIN  LACTIC ACID, PLASMA  TRIGLYCERIDES  LACTIC ACID, PLASMA  POC SARS CORONAVIRUS 2 AG  -  ED    EKG EKG Interpretation  Date/Time:  Sunday August 01 2019 09:35:25 EST Ventricular Rate:  88 PR Interval:    QRS Duration: 90 QT Interval:  389 QTC Calculation: 471 R Axis:   55 Text Interpretation: Sinus rhythm Probable left atrial enlargement RSR' in V1 or V2, probably normal variant similar to prior 8/16 Confirmed by Aletta Edouard [phone]) on 08/01/2019 9:49:03 AM   Radiology Ct Abdomen Pelvis W Contrast  Result Date: 08/01/2019 CLINICAL DATA:  Acute abdominal pain EXAM: CT ABDOMEN AND PELVIS WITH CONTRAST TECHNIQUE: Multidetector CT imaging of the abdomen and pelvis was performed using the standard protocol following bolus administration of intravenous contrast. CONTRAST:  155mL OMNIPAQUE IOHEXOL 300 MG/ML  SOLN COMPARISON:  CT abdomen pelvis dated 08/07/2017 FINDINGS: Lower chest: There is moderate bibasilar atelectasis. Hepatobiliary: No focal liver abnormality is seen. No gallstones, gallbladder wall thickening, or biliary dilatation. Pancreas: Unremarkable. No pancreatic ductal dilatation or surrounding inflammatory changes. Spleen: Normal in size without focal abnormality. Adrenals/Urinary Tract: Adrenal glands are unremarkable. Kidneys are normal, without renal calculi, focal lesion, or hydronephrosis. Bladder is unremarkable. Stomach/Bowel: Stomach is within normal limits. Appendix appears normal. There is colonic diverticulosis without evidence of diverticulitis. No evidence of bowel wall thickening, distention, or inflammatory changes. Vascular/Lymphatic: Aortic atherosclerosis. No enlarged abdominal or pelvic lymph nodes. Reproductive: Prostate is unremarkable. Other: No abdominal wall hernia or abnormality. No abdominopelvic ascites. Musculoskeletal: A right hip arthroplasty is noted. Streak artifact obscures portions of the pelvis. Degenerative changes are seen in the spine. IMPRESSION: 1. No acute findings in the abdomen or pelvis. 2. Bibasilar atelectasis. Aortic  Atherosclerosis (ICD10-I70.0). Electronically Signed   By: Zerita Boers M.D.   On: 08/01/2019 14:35   Dg Chest Port 1 View  Result Date: 08/01/2019 CLINICAL DATA:  Weakness, cough and dizziness for 1 day. EXAM: PORTABLE CHEST 1 VIEW COMPARISON:  04/13/2015 FINDINGS: Normal heart size. No pleural effusion or edema. No airspace opacities identified.  Decreased lung volumes. IMPRESSION: No acute cardiopulmonary abnormalities. Electronically Signed   By: Kerby Moors M.D.   On: 08/01/2019 10:03    Procedures .Critical Care Performed by: Hayden Rasmussen, MD Authorized by: Hayden Rasmussen, MD   Critical care provider statement:    Critical care time (minutes):  45   Critical care was necessary to treat or prevent imminent or life-threatening deterioration of the following conditions:  Shock and sepsis   Critical care was time spent personally by me on the following activities:  Evaluation of patient's response to treatment, examination of patient, ordering and performing treatments and interventions, ordering and review of laboratory studies, ordering and review of radiographic studies, pulse oximetry, re-evaluation of patient's condition, obtaining history from patient or surrogate, review of old charts and development of treatment plan with patient or surrogate   I assumed direction of critical care for this patient from another provider in my specialty: no     (including critical care time)  Medications Ordered in ED Medications  0.9 %  sodium chloride infusion (1,000 mLs Intravenous New Bag/Given 08/01/19 1004)  cefTRIAXone (ROCEPHIN) 1 g in sodium chloride 0.9 % 100 mL IVPB (has no administration in time range)  ondansetron (ZOFRAN) injection 4 mg (4 mg Intravenous Given 08/01/19 0931)  promethazine (PHENERGAN) injection 12.5 mg (12.5 mg Intravenous Given 08/01/19 1002)  sodium chloride 0.9 % bolus 1,000 mL (0 mLs Intravenous Stopped 08/01/19 1443)  piperacillin-tazobactam (ZOSYN) IVPB  3.375 g (0 g Intravenous Stopped 08/01/19 1443)  iohexol (OMNIPAQUE) 300 MG/ML solution 100 mL (100 mLs Intravenous Contrast Given 08/01/19 1405)  lactated ringers bolus 1,800 mL (1,800 mLs Intravenous New Bag/Given 08/01/19 1643)     Initial Impression / Assessment and Plan / ED Course  I have reviewed the triage vital signs and the nursing notes.  Pertinent labs & imaging results that were available during my care of the patient were reviewed by me and considered in my medical decision making (see chart for details).  Clinical Course as of Aug 01 1715  Nancy Fetter Aug 01, 7559  3825 68 year old male here with 1 week of generalized lightheadedness and fatigue and now with nausea vomiting diarrhea in the setting of a cough.  Differential includes Covid, viral syndrome, dehydration, gastroenteritis, colitis, diverticulitis.   [MB]  [id number] Patient's lactate came back 7.5.  IV fluids infusing and antibiotics ordered.  Reevaluation patient states he is feeling better.  No real pain.  Nausea improved.  Point-of-care Covid testing negative.  Have ordered the send out.  Chest x-ray not showing any obvious infiltrates.  CT abdomen pelvis pending.   [MB]  [id number] CT interpreted by me is very large bladder distention.  Asked the nurse to see if the patient can urinate.  She says he has a history of bladder distention and is having no trouble urinating.  Ordered a UA.   [MB]  1443 CT showing moderate bibasilar atelectasis.  Otherwise unremarkable study.   [MB]    Clinical Course User Index [MB] Hayden Rasmussen, MD   Debbora Presto was evaluated in Emergency Department on 08/01/2019 for the symptoms described in the history of present illness. He was evaluated in the context of the global COVID-19 pandemic, which necessitated consideration that the patient might be at risk for infection with the SARS-CoV-2 virus that causes COVID-19. Institutional protocols and algorithms that pertain to the evaluation of patients at  risk for COVID-19 are in a state of rapid change based on information released  by regulatory bodies including the CDC and federal and state organizations. These policies and algorithms were followed during the patient's care in the ED.    Patient signed out to Dr Rex Kras with plan for followup on lactate and urine. Possible discharge if cleared but may need admission.   Final Clinical Impressions(s) / ED Diagnoses   Final diagnoses:  Sepsis, due to unspecified organism, unspecified whether acute organ dysfunction present [facility])  Urinary tract infection without hematuria, site unspecified    ED Discharge Orders    None       Hayden Rasmussen, MD 08/01/19 1719

## 2019-08-01 NOTE — ED Triage Notes (Signed)
Per EMS: Pt from home.  Pt started feeling weak last week. Last night pt began having N/V/D, cough, and dizziness.  Pt not diabetic but CBG is 299. 130/96 Pulse 88 SPO2 95 %RA 650 NS 4 mg zofran

## 2019-08-01 NOTE — ED Notes (Addendum)
Date and time results received: 08/01/19 11:24 AM   Test: lactic acid Critical Value: 7.5  Name of Provider Notified: Dr. Melina Copa

## 2019-08-01 NOTE — ED Notes (Signed)
ED TO INPATIENT HANDOFF REPORT  Name/Age/Gender James Gates 68 y.o. male  Code Status Code Status History    Date Active Date Inactive Code Status Order ID Comments User Context   02/16/2018 1612 02/17/2018 1511 Full Code [id number]  Jovita Gamma, MD Inpatient   12/20/2015 1436 12/22/2015 0019 Full Code [id number]  Jovita Gamma, MD Inpatient   04/24/2015 1431 04/26/2015 1740 Full Code [id number]  Frederik Pear, MD Inpatient   Advance Care Planning Activity      Home/SNF/Other Home  Chief Complaint n/v/d  Level of Care/Admitting Diagnosis ED Disposition    ED Disposition Condition [place]: [facility] [[id number]]  Level of Care: Med-Surg [16]  Covid Evaluation: Asymptomatic Screening Protocol (No Symptoms)  Diagnosis: Lactic acidosis [660630]  Admitting Physician: Caren Griffins [phone]  Attending Physician: Caren Griffins [[id number]]  PT Class (Do Not Modify): Observation [104]  PT Acc Code (Do Not Modify): Observation [10022]       Medical History Past Medical History:  Diagnosis Date  . Arthritis   . BPH (benign prostatic hypertrophy) with urinary retention   . Colon polyps   . GERD (gastroesophageal reflux disease)   . History of BPH   . Hyperlipidemia   . Numbness and tingling of both legs   . PONV (postoperative nausea and vomiting)   . Pre-diabetes   . PTSD (post-traumatic stress disorder)    [place]; being consulted at the [place] in [place].   . Sleep apnea    cpap  . Sleep apnea, obstructive 2011   sleep study done @ [facility]; does not wear CPAP at this moment  . Spinal stenosis of lumbar region   . Wears partial dentures     Allergies No Known Allergies  IV Location/Drains/Wounds Patient Lines/Drains/Airways Status   Active Line/Drains/Airways    Name:   Placement date:   Placement time:   Site:   Days:   Peripheral IV 08/01/19 Right Forearm   08/01/19    1000    Forearm   less than 1    Peripheral IV 08/01/19 Left Antecubital   08/01/19    -    Antecubital   less than 1   Incision (Closed) 04/24/15 Hip   04/24/15    1203     1560   Incision (Closed) 12/20/15 Back   12/20/15    0944     1320   Incision (Closed) 02/16/18 Back Other (Comment)   02/16/18    1443     531          Labs/Imaging Results for orders placed or performed during the hospital encounter of 08/01/19 (from the past 48 hour(s))  CBG monitoring, ED     Status: Abnormal   Collection Time: 08/01/19  8:39 AM  Result Value Ref Range   Glucose-Capillary 209 (H) 70 - 99 mg/dL  Lactic acid, plasma     Status: Abnormal   Collection Time: 08/01/19  8:57 AM  Result Value Ref Range   Lactic Acid, Venous 7.5 (HH) 0.5 - 1.9 mmol/L    Comment: CRITICAL RESULT CALLED TO, READ BACK BY AND VERIFIED WITH: GRIFFEN,M RN _0  ON 08/01/2019 JACKSON,K Performed at [facility], [place] [address], [place], [place] [address]   CBC WITH DIFFERENTIAL     Status: Abnormal   Collection Time: 08/01/19  8:57 AM  Result Value Ref Range   WBC 12.8 (H) 4.0 - 10.5 K/uL   RBC 5.30 4.22 -  5.81 MIL/uL   Hemoglobin 15.7 13.0 - 17.0 g/dL   HCT 49.2 39.0 - 52.0 %   MCV 92.8 80.0 - 100.0 fL   MCH 29.6 26.0 - 34.0 pg   MCHC 31.9 30.0 - 36.0 g/dL   RDW 12.8 11.5 - 15.5 %   Platelets 154 150 - 400 K/uL   nRBC 0.0 0.0 - 0.2 %   Neutrophils Relative % 85 %   Neutro Abs 10.8 (H) 1.7 - 7.7 K/uL   Lymphocytes Relative 8 %   Lymphs Abs 1.1 0.7 - 4.0 K/uL   Monocytes Relative 7 %   Monocytes Absolute 0.9 0.1 - 1.0 K/uL   Eosinophils Relative 0 %   Eosinophils Absolute 0.0 0.0 - 0.5 K/uL   Basophils Relative 0 %   Basophils Absolute 0.0 0.0 - 0.1 K/uL   Immature Granulocytes 0 %   Abs Immature Granulocytes 0.05 0.00 - 0.07 K/uL    Comment: Performed at [facility], [place] [address], [place], [place] [address]  Comprehensive metabolic panel     Status: Abnormal   Collection Time: 08/01/19  8:57 AM   Result Value Ref Range   Sodium 142 135 - 145 mmol/L   Potassium 3.3 (L) 3.5 - 5.1 mmol/L   Chloride 107 98 - 111 mmol/L   CO2 21 (L) 22 - 32 mmol/L   Glucose, Bld 210 (H) 70 - 99 mg/dL   BUN 19 8 - 23 mg/dL   Creatinine, Ser 1.27 (H) 0.61 - 1.24 mg/dL   Calcium 9.1 8.9 - 10.3 mg/dL   Total Protein 7.5 6.5 - 8.1 g/dL   Albumin 4.5 3.5 - 5.0 g/dL   AST 37 15 - 41 U/L   ALT 39 0 - 44 U/L   Alkaline Phosphatase 70 38 - 126 U/L   Total Bilirubin 1.5 (H) 0.3 - 1.2 mg/dL   GFR calc non Af Amer 57 (L) >60 mL/min   GFR calc Af Amer >60 >60 mL/min   Anion gap 14 5 - 15    Comment: Performed at [facility], [place] [address] [place], [place] [address]  D-dimer, quantitative     Status: None   Collection Time: 08/01/19  8:57 AM  Result Value Ref Range   D-Dimer, Quant 0.42 0.00 - 0.50 ug/mL-FEU    Comment: (NOTE) At the manufacturer cut-off of 0.50 ug/mL FEU, this assay has been documented to exclude PE with a sensitivity and negative predictive value of 97 to 99%.  At this time, this assay has not been approved by the FDA to exclude DVT/VTE. Results should be correlated with clinical presentation. Performed at [facility], [place] [address], [place], [place] [address]   Procalcitonin     Status: None   Collection Time: 08/01/19  8:57 AM  Result Value Ref Range   Procalcitonin <0.10 ng/mL    Comment:        Interpretation: PCT (Procalcitonin) <= 0.5 ng/mL: Systemic infection (sepsis) is not likely. Local bacterial infection is possible. (NOTE)       Sepsis PCT Algorithm           Lower Respiratory Tract                                      Infection PCT Algorithm    ----------------------------     ----------------------------  PCT < 0.25 ng/mL                PCT < 0.10 ng/mL         Strongly encourage             Strongly discourage   discontinuation of antibiotics    initiation of antibiotics    ----------------------------      -----------------------------       PCT 0.25 - 0.50 ng/mL            PCT 0.10 - 0.25 ng/mL               OR       >80% decrease in PCT            Discourage initiation of                                            antibiotics      Encourage discontinuation           of antibiotics    ----------------------------     -----------------------------         PCT >= 0.50 ng/mL              PCT 0.26 - 0.50 ng/mL               AND        <80% decrease in PCT             Encourage initiation of                                             antibiotics       Encourage continuation           of antibiotics    ----------------------------     -----------------------------        PCT >= 0.50 ng/mL                  PCT > 0.50 ng/mL               AND         increase in PCT                  Strongly encourage                                      initiation of antibiotics    Strongly encourage escalation           of antibiotics                                     -----------------------------                                           PCT <= 0.25 ng/mL  OR                                        > 80% decrease in PCT                                     Discontinue / Do not initiate                                             antibiotics Performed at [place] [address], [place], [place] [address]   Lactate dehydrogenase     Status: None   Collection Time: 08/01/19  8:57 AM  Result Value Ref Range   LDH 128 98 - 192 U/L    Comment: Performed at [facility], [place] [address] [place], [place] [address]  Ferritin     Status: None   Collection Time: 08/01/19  8:57 AM  Result Value Ref Range   Ferritin 197 24 - 336 ng/mL    Comment: Performed at [facility], [place] [address], [place], [place] [address]  Fibrinogen     Status: None   Collection Time: 08/01/19  8:57 AM  Result Value Ref  Range   Fibrinogen 298 210 - 475 mg/dL    Comment: Performed at [facility], [place] [address] [place], [place] [address]  C-reactive protein     Status: None   Collection Time: 08/01/19  8:57 AM  Result Value Ref Range   CRP 0.7 <1.0 mg/dL    Comment: Performed at [facility], [place] [address] [place], [place] [address]  Blood Culture (routine x 2)     Status: None (Preliminary result)   Collection Time: 08/01/19  9:02 AM   Specimen: BLOOD RIGHT FOREARM  Result Value Ref Range   Specimen Description      BLOOD RIGHT FOREARM Performed at [facility], [place] [address] [place], [place] [address]    Special Requests      BOTTLES DRAWN AEROBIC AND ANAEROBIC Blood Culture adequate volume Performed at [place] [address] [place], [place] [address]    Culture PENDING    Report Status PENDING   POC SARS Coronavirus 2 Ag-ED - Nasal Swab (BD Veritor Kit)     Status: None   Collection Time: 08/01/19 11:26 AM  Result Value Ref Range   SARS Coronavirus 2 Ag NEGATIVE NEGATIVE    Comment: (NOTE) SARS-CoV-2 antigen NOT DETECTED.  Negative results are presumptive.  Negative results do not preclude SARS-CoV-2 infection and should not be used as the sole basis for treatment or other patient management decisions, including infection  control decisions, particularly in the presence of clinical signs and  symptoms consistent with COVID-19, or in those who have been in contact with the virus.  Negative results must be combined with clinical observations, patient history, and epidemiological information. The expected result is Negative. Fact Sheet for Patients: PodPark.tn Fact Sheet for Healthcare Providers: GiftContent.is This test is not yet approved or cleared by the [place] FDA and  has been authorized for detection and/or diagnosis of SARS-CoV-2 by FDA under an  Emergency Use Authorization (EUA).  This EUA will remain in effect (meaning this test can be used) for the duration of  the COVID-19 de claration under Section 564(b)(1) of the Act, 21 U.S.C. section 360bbb-3(b)(1), unless the authorization is terminated or revoked sooner.   Lactic acid, plasma     Status: Abnormal   Collection Time: 08/01/19  2:28 PM  Result Value Ref Range   Lactic Acid, Venous 4.3 (HH) 0.5 - 1.9 mmol/L    Comment: CRITICAL VALUE NOTED.  VALUE IS CONSISTENT WITH PREVIOUSLY REPORTED AND CALLED VALUE. Performed at [facility], [place] [address] [place], [place] [address]   Urinalysis, Routine w reflex microscopic     Status: Abnormal   Collection Time: 08/01/19  3:25 PM  Result Value Ref Range   Color, Urine YELLOW YELLOW   APPearance HAZY (A) CLEAR   Specific Gravity, Urine 1.017 1.005 - 1.030   pH 7.0 5.0 - 8.0   Glucose, UA 50 (A) NEGATIVE mg/dL   Hgb urine dipstick NEGATIVE NEGATIVE   Bilirubin Urine NEGATIVE NEGATIVE   Ketones, ur 5 (A) NEGATIVE mg/dL   Protein, ur NEGATIVE NEGATIVE mg/dL   Nitrite NEGATIVE NEGATIVE   Leukocytes,Ua LARGE (A) NEGATIVE   RBC / HPF 0-5 0 - 5 RBC/hpf   WBC, UA 11-20 0 - 5 WBC/hpf   Bacteria, UA FEW (A) NONE SEEN   WBC Clumps PRESENT    Mucus PRESENT    Amorphous Crystal PRESENT     Comment: Performed at [facility], [place] [address], [place], [place] [address]  Lactic acid, plasma     Status: Abnormal   Collection Time: 08/01/19  4:28 PM  Result Value Ref Range   Lactic Acid, Venous 2.8 (HH) 0.5 - 1.9 mmol/L    Comment: CRITICAL VALUE NOTED.  VALUE IS CONSISTENT WITH PREVIOUSLY REPORTED AND CALLED VALUE. Performed at [facility], [place] [address], [place], [place] [address]    Ct Abdomen Pelvis W Contrast  Result Date: 08/01/2019 CLINICAL DATA:  Acute abdominal pain EXAM: CT ABDOMEN AND PELVIS WITH CONTRAST TECHNIQUE: Multidetector CT imaging of the abdomen and pelvis  was performed using the standard protocol following bolus administration of intravenous contrast. CONTRAST:  182m OMNIPAQUE IOHEXOL 300 MG/ML  SOLN COMPARISON:  CT abdomen pelvis dated 08/07/2017 FINDINGS: Lower chest: There is moderate bibasilar atelectasis. Hepatobiliary: No focal liver abnormality is seen. No gallstones, gallbladder wall thickening, or biliary dilatation. Pancreas: Unremarkable. No pancreatic ductal dilatation or surrounding inflammatory changes. Spleen: Normal in size without focal abnormality. Adrenals/Urinary Tract: Adrenal glands are unremarkable. Kidneys are normal, without renal calculi, focal lesion, or hydronephrosis. Bladder is unremarkable. Stomach/Bowel: Stomach is within normal limits. Appendix appears normal. There is colonic diverticulosis without evidence of diverticulitis. No evidence of bowel wall thickening, distention, or inflammatory changes. Vascular/Lymphatic: Aortic atherosclerosis. No enlarged abdominal or pelvic lymph nodes. Reproductive: Prostate is unremarkable. Other: No abdominal wall hernia or abnormality. No abdominopelvic ascites. Musculoskeletal: A right hip arthroplasty is noted. Streak artifact obscures portions of the pelvis. Degenerative changes are seen in the spine. IMPRESSION: 1. No acute findings in the abdomen or pelvis. 2. Bibasilar atelectasis. Aortic Atherosclerosis (ICD10-I70.0). Electronically Signed   By: TZerita BoersM.D.   On: 08/01/2019 14:35   Dg Chest Port 1 View  Result Date: 08/01/2019 CLINICAL DATA:  Weakness, cough and dizziness for 1 day. EXAM: PORTABLE CHEST 1 VIEW COMPARISON:  04/13/2015 FINDINGS: Normal heart size. No pleural effusion or edema. No airspace opacities identified. Decreased lung volumes. IMPRESSION:  No acute cardiopulmonary abnormalities. Electronically Signed   By: Kerby Moors M.D.   On: 08/01/2019 10:03    Pending Labs Unresulted Labs (From admission, onward)    Start     Ordered   08/01/19 1415  Urine  culture  ONCE - STAT,   STAT     08/01/19 1414   08/01/19 1134  SARS CORONAVIRUS 2 (TAT 6-24 HRS) Nasopharyngeal Nasopharyngeal Swab  (Asymptomatic/Tier 3)  Once,   STAT    Question Answer Comment  Is this test for diagnosis or screening Diagnosis of ill patient   Symptomatic for COVID-19 as defined by CDC Yes   Date of Symptom Onset 07/25/2019   Hospitalized for COVID-19 No   Admitted to ICU for COVID-19 No   Previously tested for COVID-19 Yes   Resident in a congregate (group) care setting No   Employed in healthcare setting No      08/01/19 1133   08/01/19 0857  Lactic acid, plasma  Now then every 2 hours,   STAT     08/01/19 0857   08/01/19 0857  Blood Culture (routine x 2)  BLOOD CULTURE X 2,   STAT     08/01/19 0857   08/01/19 0857  Triglycerides  Once,   STAT     08/01/19 0857   Signed and Held  HIV Antibody (routine testing w rflx)  (HIV Antibody (Routine testing w reflex) panel)  Once,   R     Signed and Held   Signed and Held  Comprehensive metabolic panel  Tomorrow morning,   R     Signed and Held   Signed and Held  CBC  Tomorrow morning,   R     Signed and Held   Signed and Held  Hemoglobin A1c  Once,   R    Comments: To assess prior glycemic control    Signed and Held   Signed and Held  Lactic acid, plasma  Tomorrow morning,   R     Signed and Held          Vitals/Pain Today's Vitals   08/01/19 1815 08/01/19 1830 08/01/19 1900 08/01/19 1930  BP:  140/77 (!) 175/88 (!) 141/77  Pulse: 77 77 84 72  Resp:  _0 Temp:      TempSrc:      SpO2: 92% 91% 93% 94%  Weight:      Height:        Isolation Precautions No active isolations  Medications Medications  0.9 %  sodium chloride infusion (1,000 mLs Intravenous New Bag/Given 08/01/19 1004)  cefTRIAXone (ROCEPHIN) 1 g in sodium chloride 0.9 % 100 mL IVPB (1 g Intravenous New Bag/Given 08/01/19 1903)  ondansetron (ZOFRAN) injection 4 mg (4 mg Intravenous Given 08/01/19 0931)  promethazine (PHENERGAN)  injection 12.5 mg (12.5 mg Intravenous Given 08/01/19 1002)  sodium chloride 0.9 % bolus 1,000 mL (0 mLs Intravenous Stopped 08/01/19 1443)  piperacillin-tazobactam (ZOSYN) IVPB 3.375 g (0 g Intravenous Stopped 08/01/19 1443)  iohexol (OMNIPAQUE) 300 MG/ML solution 100 mL (100 mLs Intravenous Contrast Given 08/01/19 1405)  lactated ringers bolus 1,800 mL (0 mLs Intravenous Stopped 08/01/19 1850)    Mobility walks with person assist

## 2019-08-01 NOTE — ED Provider Notes (Signed)
I received this patient in signout from Dr. Melina Copa.  Briefly, he had presented with several days of dry cough and malaise followed by vomiting and diarrhea for the past few days.  Vital signs stable on arrival however his lactate was concerning at 7.5; this was obtained after he was given 650cc fluids by EMS.  At time of signout, awaiting urinalysis and repeat lactate.  Repeat lactate after initial fluid bolus is persistently high at 4.3.  UA is suggestive of infection with large leukocytes, WBCs, bacteria.  he reports history of problems with BPH and has had UTI in the past. Given his persistent high lactate and infectious source, recommended admission.Pt has already had zosyn. Gave another 1800cc fluid. Discussed w/ [facility], Dr. Cruzita Lederer, who will admit for further treatment.   CRITICAL CARE Performed by: Wenda Overland Loghan Kurtzman   Total critical care time: 30 minutes  Critical care time was exclusive of separately billable procedures and treating other patients.  Critical care was necessary to treat or prevent imminent or life-threatening deterioration.  Critical care was time spent personally by me on the following activities: development of treatment plan with patient and/or surrogate as well as nursing, discussions with consultants, evaluation of patient's response to treatment, examination of patient, obtaining history from patient or surrogate, ordering and performing treatments and interventions, ordering and review of laboratory studies, ordering and review of radiographic studies, pulse oximetry and re-evaluation of patient's condition.  ERASTO KUEHLER was evaluated in Emergency Department on 08/01/2019 for the symptoms described in the history of present illness. He was evaluated in the context of the global COVID-19 pandemic, which necessitated consideration that the patient might be at risk for infection with the SARS-CoV-2 virus that causes COVID-19. Institutional protocols and algorithms  that pertain to the evaluation of patients at risk for COVID-19 are in a state of rapid change based on information released by regulatory bodies including the CDC and federal and state organizations. These policies and algorithms were followed during the patient's care in the ED.    Rayleigh Gillyard, Wenda Overland, MD 08/01/19 [phone]

## 2019-08-01 NOTE — ED Notes (Signed)
Sunday Spillers (wife) [phone] please call with update.

## 2019-08-01 NOTE — ED Notes (Signed)
Patient has PO fluids at bedside, patient reported sipping on fluids and keeping them down, encouraged to keep sipping.

## 2019-08-01 NOTE — H&P (Signed)
History and Physical    James Gates [id number] DOB: August 20, 1951 DOA: 08/01/2019  I have briefly reviewed the patient's prior medical records in [facility]  PCP: Bartholome Bill, MD  Patient coming from: home  Chief Complaint: Weakness, nausea  HPI: James Gates is a 68 y.o. male with medical history significant of BPH, GERD, prediabetes, comes to the hospital with complaints of weeklong generalized weakness.  He tells me that he has been having intermittent nausea and poor p.o. intake for the same amount of time.  Symptoms have progressed to the point that today he was barely able to walk due to profound weakness.  He denies any chest pain, denies any palpitations.  He complains of mild dysuria but no burning with urination.  He does have difficulty urinating at times and has a history of BPH.  He denies any fever or chills.  He denies any abdominal pain or diarrhea.  He denies any shortness of breath, cough or chest congestion.  He denies any sore throat.  He denies any sick contacts.  ED Course: In the emergency room his vitals are stable, he is afebrile normotensive and satting well on room air.  Initial blood work shows a potassium of 3.3, creatinine 1.2, total bilirubin slightly elevated at 1.5.  Initial lactic acid was found to be quite high at 7.5, and improved after fluids to 4.3.  His white count is 12.8.  His urinalysis show evidence of UTI with large leukocyte esterase, bacteria present as well as WBC.  He underwent a CT scan of the abdomen pelvis without acute findings.  He was given Zosyn x1 and given persistently elevated lactic acid we are asked to admit.  His Covid antigen is negative and PCR is pending  Review of Systems: All systems reviewed, and apart from HPI, all negative  Past Medical History:  Diagnosis Date   Arthritis    BPH (benign prostatic hypertrophy) with urinary retention    Colon polyps    GERD (gastroesophageal reflux disease)     History of BPH    Hyperlipidemia    Numbness and tingling of both legs    PONV (postoperative nausea and vomiting)    Pre-diabetes    PTSD (post-traumatic stress disorder)    [facility]; being consulted at the [facility] in [place].    Sleep apnea    cpap   Sleep apnea, obstructive 2011   sleep study done @ [facility]; does not wear CPAP at this moment   Spinal stenosis of lumbar region    Wears partial dentures     Past Surgical History:  Procedure Laterality Date   COLONOSCOPY     COLONOSCOPY W/ POLYPECTOMY     CYST EXCISION     on back   LUMBAR LAMINECTOMY/DECOMPRESSION MICRODISCECTOMY N/A 12/20/2015   Procedure: Lumbar two-Lumbar five Decompressive Laminectomy;  Surgeon: Jovita Gamma, MD;  Location: [facility];  Service: Neurosurgery;  Laterality: N/A;   LUMBAR LAMINECTOMY/DECOMPRESSION MICRODISCECTOMY Right 02/16/2018   Procedure: LUMBAR ONE-TWO LAMINOTOMY AND MICRODISCECTOMY;  Surgeon: Jovita Gamma, MD;  Location: [facility];  Service: Neurosurgery;  Laterality: Right;  LUMBAR ONE-TWO LAMINOTOMY AND MICRODISCECTOMY   POLYPECTOMY     TOTAL HIP ARTHROPLASTY Right 04/24/2015   Procedure: TOTAL HIP ARTHROPLASTY ANTERIOR APPROACH;  Surgeon: Frederik Pear, MD;  Location: [facility];  Service: Orthopedics;  Laterality: Right;     reports that he quit smoking about 37 years ago. He has never used smokeless tobacco. He reports current alcohol use of  about 6.0 standard drinks of alcohol per week. He reports that he does not use drugs.  No Known Allergies  Family History  Problem Relation Age of Onset   Lung cancer Father    Ulcers Unknown    Colon cancer Neg Hx     Prior to Admission medications   Medication Sig Start Date End Date Taking? Authorizing Provider  aspirin 81 MG tablet Take 81 mg by mouth daily.   Yes [provider]  KRILL OIL PO Take 1 capsule by mouth daily.    Yes [provider]  naproxen sodium (ANAPROX) 220 MG tablet Take 220 mg  by mouth 2 (two) times daily as needed (pain).   Yes [provider]  tamsulosin (FLOMAX) 0.4 MG CAPS capsule Take 0.8 mg by mouth at bedtime.    Yes [provider]  TURMERIC PO Take 1 capsule by mouth daily.    Yes [provider]  HYDROcodone-acetaminophen (NORCO/VICODIN) 5-325 MG tablet Take 0.5-1 tablets by mouth every 4 (four) hours as needed (pain). Patient not taking: Reported on 08/01/2019 02/17/18   Jovita Gamma, MD    Physical Exam: Vitals:   08/01/19 1300 08/01/19 1529 08/01/19 1600 08/01/19 1630  BP: 118/64 (!) 143/74 138/73 (!) 147/70  Pulse: 74 76 72 72  Resp: 18 18  15   Temp:      TempSrc:      SpO2: 93% 97% 94% 95%  Weight:      Height:        Constitutional: NAD, calm, comfortable Eyes: PERRL, lids and conjunctivae normal ENMT: Mucous membranes are moist. Posterior pharynx clear of any exudate or lesions.Normal dentition.  Neck: normal, supple Respiratory: clear to auscultation bilaterally, no wheezing, no crackles. Normal respiratory effort. No accessory muscle use.  Cardiovascular: Regular rate and rhythm, no murmurs / rubs / gallops. No extremity edema. 2+ pedal pulses.  Abdomen: no tenderness, no masses palpated. Bowel sounds positive.  Musculoskeletal: no clubbing / cyanosis.  Skin: no rashes, lesions, ulcers. No induration Neurologic: CN 2-12 grossly intact. Strength 5/5 in all 4.  Psychiatric: Normal judgment and insight. Alert and oriented x 3. Normal mood.   Labs on Admission: I have personally reviewed following labs and imaging studies  CBC: Recent Labs  Lab 08/01/19 0857  WBC 12.8*  NEUTROABS 10.8*  HGB 15.7  HCT 49.2  MCV 92.8  PLT 123456   Basic Metabolic Panel: Recent Labs  Lab 08/01/19 0857  NA 142  K 3.3*  CL 107  CO2 21*  GLUCOSE 210*  BUN 19  CREATININE 1.27*  CALCIUM 9.1   Liver Function Tests: Recent Labs  Lab 08/01/19 0857  AST 37  ALT 39  ALKPHOS 70  BILITOT 1.5*  PROT 7.5  ALBUMIN  4.5   Coagulation Profile: No results for input(s): INR, PROTIME in the last 168 hours. BNP (last 3 results) No results for input(s): PROBNP in the last 8760 hours. CBG: Recent Labs  Lab 08/01/19 0839  GLUCAP 209*   Thyroid Function Tests: No results for input(s): TSH, T4TOTAL, FREET4, T3FREE, THYROIDAB in the last 72 hours. Urine analysis:    Component Value Date/Time   COLORURINE YELLOW 08/01/2019 1525   APPEARANCEUR HAZY (A) 08/01/2019 1525   LABSPEC 1.017 08/01/2019 1525   PHURINE 7.0 08/01/2019 1525   GLUCOSEU 50 (A) 08/01/2019 1525   HGBUR NEGATIVE 08/01/2019 1525   BILIRUBINUR NEGATIVE 08/01/2019 1525   BILIRUBINUR n 10/11/2013 1157   KETONESUR 5 (A) 08/01/2019 1525  PROTEINUR NEGATIVE 08/01/2019 1525   UROBILINOGEN 0.2 04/13/2015 1141   NITRITE NEGATIVE 08/01/2019 1525   LEUKOCYTESUR LARGE (A) 08/01/2019 1525     Radiological Exams on Admission: Ct Abdomen Pelvis W Contrast  Result Date: 08/01/2019 CLINICAL DATA:  Acute abdominal pain EXAM: CT ABDOMEN AND PELVIS WITH CONTRAST TECHNIQUE: Multidetector CT imaging of the abdomen and pelvis was performed using the standard protocol following bolus administration of intravenous contrast. CONTRAST:  142mL OMNIPAQUE IOHEXOL 300 MG/ML  SOLN COMPARISON:  CT abdomen pelvis dated 08/07/2017 FINDINGS: Lower chest: There is moderate bibasilar atelectasis. Hepatobiliary: No focal liver abnormality is seen. No gallstones, gallbladder wall thickening, or biliary dilatation. Pancreas: Unremarkable. No pancreatic ductal dilatation or surrounding inflammatory changes. Spleen: Normal in size without focal abnormality. Adrenals/Urinary Tract: Adrenal glands are unremarkable. Kidneys are normal, without renal calculi, focal lesion, or hydronephrosis. Bladder is unremarkable. Stomach/Bowel: Stomach is within normal limits. Appendix appears normal. There is colonic diverticulosis without evidence of diverticulitis. No evidence of bowel wall  thickening, distention, or inflammatory changes. Vascular/Lymphatic: Aortic atherosclerosis. No enlarged abdominal or pelvic lymph nodes. Reproductive: Prostate is unremarkable. Other: No abdominal wall hernia or abnormality. No abdominopelvic ascites. Musculoskeletal: A right hip arthroplasty is noted. Streak artifact obscures portions of the pelvis. Degenerative changes are seen in the spine. IMPRESSION: 1. No acute findings in the abdomen or pelvis. 2. Bibasilar atelectasis. Aortic Atherosclerosis (ICD10-I70.0). Electronically Signed   By: Zerita Boers M.D.   On: 08/01/2019 14:35   Dg Chest Port 1 View  Result Date: 08/01/2019 CLINICAL DATA:  Weakness, cough and dizziness for 1 day. EXAM: PORTABLE CHEST 1 VIEW COMPARISON:  04/13/2015 FINDINGS: Normal heart size. No pleural effusion or edema. No airspace opacities identified. Decreased lung volumes. IMPRESSION: No acute cardiopulmonary abnormalities. Electronically Signed   By: Kerby Moors M.D.   On: 08/01/2019 10:03    EKG: Independently reviewed.  Sinus rhythm  Assessment/Plan  Principal Problem Urinary tract infection with elevated lactic acid -Patient with some symptoms, urinalysis does suggest infection and was started on ceftriaxone, urine cultures are pending -He does not look particularly septic -Provide IV fluids overnight, repeat lactic acid tomorrow morning  Active Problems Nausea -Symptomatic treatment, start clear liquids, advance as tolerated, IV fluids  BPH -Continue home medications  Prediabetes -Placed on sliding scale, he is hyperglycemic in the ED to 210.  Obtain a hemoglobin A1c   DVT prophylaxis: Lovenox  Code Status: Full code  Family Communication: d/w patient  Disposition Plan: home when ready, possibly 24h   Bed Type: medsurg Consults called: none  Obs/Inp: obs  Marzetta Board, MD, PhD [facility]  Contact via [email or web address]  08/01/2019, 5:15 PM

## 2019-08-02 DIAGNOSIS — R338 Other retention of urine: Secondary | ICD-10-CM | POA: Diagnosis present

## 2019-08-02 DIAGNOSIS — Z8719 Personal history of other diseases of the digestive system: Secondary | ICD-10-CM | POA: Diagnosis not present

## 2019-08-02 DIAGNOSIS — E872 Acidosis: Secondary | ICD-10-CM | POA: Diagnosis not present

## 2019-08-02 DIAGNOSIS — N401 Enlarged prostate with lower urinary tract symptoms: Secondary | ICD-10-CM | POA: Diagnosis present

## 2019-08-02 DIAGNOSIS — R42 Dizziness and giddiness: Secondary | ICD-10-CM | POA: Diagnosis present

## 2019-08-02 DIAGNOSIS — Z20828 Contact with and (suspected) exposure to other viral communicable diseases: Secondary | ICD-10-CM | POA: Diagnosis present

## 2019-08-02 DIAGNOSIS — N390 Urinary tract infection, site not specified: Secondary | ICD-10-CM | POA: Diagnosis present

## 2019-08-02 DIAGNOSIS — R7303 Prediabetes: Secondary | ICD-10-CM | POA: Diagnosis present

## 2019-08-02 DIAGNOSIS — Z79899 Other long term (current) drug therapy: Secondary | ICD-10-CM | POA: Diagnosis not present

## 2019-08-02 DIAGNOSIS — Z801 Family history of malignant neoplasm of trachea, bronchus and lung: Secondary | ICD-10-CM | POA: Diagnosis not present

## 2019-08-02 DIAGNOSIS — E860 Dehydration: Secondary | ICD-10-CM | POA: Diagnosis present

## 2019-08-02 DIAGNOSIS — R197 Diarrhea, unspecified: Secondary | ICD-10-CM | POA: Diagnosis present

## 2019-08-02 DIAGNOSIS — Z7982 Long term (current) use of aspirin: Secondary | ICD-10-CM | POA: Diagnosis not present

## 2019-08-02 DIAGNOSIS — R269 Unspecified abnormalities of gait and mobility: Secondary | ICD-10-CM | POA: Diagnosis present

## 2019-08-02 DIAGNOSIS — A411 Sepsis due to other specified staphylococcus: Secondary | ICD-10-CM | POA: Diagnosis present

## 2019-08-02 DIAGNOSIS — K219 Gastro-esophageal reflux disease without esophagitis: Secondary | ICD-10-CM | POA: Diagnosis present

## 2019-08-02 DIAGNOSIS — Z96641 Presence of right artificial hip joint: Secondary | ICD-10-CM | POA: Diagnosis present

## 2019-08-02 DIAGNOSIS — A419 Sepsis, unspecified organism: Secondary | ICD-10-CM | POA: Diagnosis present

## 2019-08-02 DIAGNOSIS — E785 Hyperlipidemia, unspecified: Secondary | ICD-10-CM | POA: Diagnosis present

## 2019-08-02 DIAGNOSIS — Z87891 Personal history of nicotine dependence: Secondary | ICD-10-CM | POA: Diagnosis not present

## 2019-08-02 LAB — GLUCOSE, CAPILLARY
Glucose-Capillary: 100 mg/dL — ABNORMAL HIGH (ref 70–99)
Glucose-Capillary: 108 mg/dL — ABNORMAL HIGH (ref 70–99)
Glucose-Capillary: 102 mg/dL — ABNORMAL HIGH (ref 70–99)
Glucose-Capillary: 106 mg/dL — ABNORMAL HIGH (ref 70–99)

## 2019-08-02 LAB — COMPREHENSIVE METABOLIC PANEL
ALT: 31 U/L (ref 0–44)
AST: 38 U/L (ref 15–41)
Albumin: 3.4 g/dL — ABNORMAL LOW (ref 3.5–5.0)
Alkaline Phosphatase: 60 U/L (ref 38–126)
Anion gap: 9 (ref 5–15)
BUN: 13 mg/dL (ref 8–23)
CO2: 23 mmol/L (ref 22–32)
Calcium: 8.8 mg/dL — ABNORMAL LOW (ref 8.9–10.3)
Chloride: 111 mmol/L (ref 98–111)
Creatinine, Ser: 1.15 mg/dL (ref 0.61–1.24)
GFR calc Af Amer: >60 mL/min (ref >60)
GFR calc non Af Amer: >60 mL/min (ref >60)
Glucose, Bld: 105 mg/dL — ABNORMAL HIGH (ref 70–99)
Potassium: 3.9 mmol/L (ref 3.5–5.1)
Sodium: 143 mmol/L (ref 135–145)
Total Bilirubin: 1.5 mg/dL — ABNORMAL HIGH (ref 0.3–1.2)
Total Protein: 6 g/dL — ABNORMAL LOW (ref 6.5–8.1)

## 2019-08-02 LAB — CBC
HCT: 44.4 % (ref 39.0–52.0)
Hemoglobin: 14 g/dL (ref 13.0–17.0)
MCH: 29.4 pg (ref 26.0–34.0)
MCHC: 31.5 g/dL (ref 30.0–36.0)
MCV: 93.1 fL (ref 80.0–100.0)
Platelets: 138 10*3/uL — ABNORMAL LOW (ref 150–400)
RBC: 4.77 MIL/uL (ref 4.22–5.81)
RDW: 13.1 % (ref 11.5–15.5)
WBC: 8 10*3/uL (ref 4.0–10.5)
nRBC: 0 % (ref 0.0–0.2)

## 2019-08-02 LAB — HEMOGLOBIN A1C
Hgb A1c MFr Bld: 6.1 % — ABNORMAL HIGH (ref 4.8–5.6)
Mean Plasma Glucose: 128.37 mg/dL

## 2019-08-02 LAB — TRIGLYCERIDES: Triglycerides: 103 mg/dL (ref <150)

## 2019-08-02 LAB — LACTIC ACID, PLASMA: Lactic Acid, Venous: 1 mmol/L (ref 0.5–1.9)

## 2019-08-02 LAB — HIV ANTIBODY (ROUTINE TESTING W REFLEX): HIV Screen 4th Generation wRfx: NONREACTIVE

## 2019-08-02 MED ORDER — PANTOPRAZOLE SODIUM 40 MG PO TBEC
40.0000 mg | DELAYED_RELEASE_TABLET | Freq: Every day | ORAL | Status: DC
  Administered 2019-08-03 – 2019-08-07 (×5): 40 mg via ORAL
  Filled 2019-08-02 (×5): qty 1

## 2019-08-02 NOTE — Progress Notes (Signed)
PROGRESS NOTE    James Gates  [id number] DOB: [birth year] DOA: 08/01/2019 PCP: Bartholome Bill, MD    Brief Narrative:  68 year old gentleman with prior h/o pre diabetes, GERD, BPh, presents with nausea, decreased oral intake, generalized weakness. He was found to have UTI, . He was started on IV antibiotics. CT abd and pelvis does not show any acute findings.  On exam today. Pt appears nauseated, dizziness, unable to get out of bed. Pt denies any palpitations.  Assessment & Plan:   Active Problems:   Lactic acidosis    lactic acidosis:  Probably from dehydration .  Resolved with hydration.    UTI:  Urine cultures are pending.  Continue with IV rocephin till cultures come back.    Generalized weakness, persistent nausea and dizziness:  Liver enzymes wnl.  Could be sec to UTI.  Continue to hydrate, recheck orthostatic vital signs.  Get PT evaluation, recommending SNF.      DVT prophylaxis: lovenox.  Code Status: full code.  Family Communication: not at bedside.  Disposition Plan: NOT stable for discharge home, CSW consulted for SNF placement.    Consultants:   None.    Procedures: none.   Antimicrobials: rocephin for UTI.   Subjective: Persistent nausea, dizziness and generalized weakness.   Objective: Vitals:   08/01/19 2024 08/01/19 2041 08/02/19 0614 08/02/19 1345  BP:  (!) 142/73 124/75 (!) 145/90  Pulse:  76 80 79  Resp:  17 16 20   Temp:  98.3 F (36.8 C) 98.4 F (36.9 C) 98.6 F (37 C)  TempSrc:  Oral Oral Oral  SpO2:  94% 94% 97%  Weight: 102.3 kg     Height: 5\' 9"  (1.753 m)       Intake/Output Summary (Last 24 hours) at 08/02/2019 1758 Last data filed at 08/02/2019 1553 Gross per 24 hour  Intake 3795.02 ml  Output 551 ml  Net 3244.02 ml   Filed Weights   08/01/19 0837 08/01/19 0841 08/01/19 2024  Weight: 96.2 kg 96.2 kg 102.3 kg    Examination:  General exam: Appears calm and comfortable  Respiratory system: Clear  to auscultation. Respiratory effort normal. Cardiovascular system: S1 & S2 heard, RRR.  No pedal edema. Gastrointestinal system: Abdomen is nondistended, soft and nontender.  Normal bowel sounds heard. Central nervous system: Alert and oriented. No focal neurological deficits. Extremities: Symmetric 5 x 5 power. Skin: No rashes, lesions or ulcers Psychiatry:  Mood & affect appropriate.     Data Reviewed: I have personally reviewed following labs and imaging studies  CBC: Recent Labs  Lab 08/01/19 0857 08/02/19 0511  WBC 12.8* 8.0  NEUTROABS 10.8*  --   HGB 15.7 14.0  HCT 49.2 44.4  MCV 92.8 93.1  PLT 154 0000000*   Basic Metabolic Panel: Recent Labs  Lab 08/01/19 0857 08/02/19 0511  NA 142 143  K 3.3* 3.9  CL 107 111  CO2 21* 23  GLUCOSE 210* 105*  BUN 19 13  CREATININE 1.27* 1.15  CALCIUM 9.1 8.8*   GFR: Estimated Creatinine Clearance: 71.4 mL/min (by C-G formula based on SCr of 1.15 mg/dL). Liver Function Tests: Recent Labs  Lab 08/01/19 0857 08/02/19 0511  AST 37 38  ALT 39 31  ALKPHOS 70 60  BILITOT 1.5* 1.5*  PROT 7.5 6.0*  ALBUMIN 4.5 3.4*   No results for input(s): LIPASE, AMYLASE in the last 168 hours. No results for input(s): AMMONIA in the last 168 hours. Coagulation Profile: No results for  input(s): INR, PROTIME in the last 168 hours. Cardiac Enzymes: No results for input(s): CKTOTAL, CKMB, CKMBINDEX, TROPONINI in the last 168 hours. BNP (last 3 results) No results for input(s): PROBNP in the last 8760 hours. HbA1C: Recent Labs    08/02/19 0511  HGBA1C 6.1*   CBG: Recent Labs  Lab 08/01/19 0839 08/01/19 2037 08/02/19 0753 08/02/19 1120 08/02/19 1628  GLUCAP 209* 106* 100* 108* 102*   Lipid Profile: Recent Labs    08/02/19 0511  TRIG 103   Thyroid Function Tests: No results for input(s): TSH, T4TOTAL, FREET4, T3FREE, THYROIDAB in the last 72 hours. Anemia Panel: Recent Labs    08/01/19 0857  FERRITIN 197   Sepsis  Labs: Recent Labs  Lab 08/01/19 0857 08/01/19 1428 08/01/19 1628 08/02/19 0511  PROCALCITON <0.10  --   --   --   LATICACIDVEN 7.5* 4.3* 2.8* 1.0    Recent Results (from the past 240 hour(s))  Blood Culture (routine x 2)     Status: None (Preliminary result)   Collection Time: 08/01/19  8:57 AM   Specimen: BLOOD  Result Value Ref Range Status   Specimen Description   Final    BLOOD RIGHT ANTECUBITAL Performed at [facility], [place] [address], [place], [place] [address]    Special Requests   Final    BOTTLES DRAWN AEROBIC AND ANAEROBIC Blood Culture adequate volume Performed at [facility] [address] [place], [place] [address]    Culture   Final    NO GROWTH 1 DAY Performed at [facility], [place] [address], [place], [place] [address]    Report Status PENDING  Incomplete  Blood Culture (routine x 2)     Status: None (Preliminary result)   Collection Time: 08/01/19  9:02 AM   Specimen: BLOOD RIGHT FOREARM  Result Value Ref Range Status   Specimen Description   Final    BLOOD RIGHT FOREARM Performed at [facility], [place] [address], [place], [place] [address]    Special Requests   Final    BOTTLES DRAWN AEROBIC AND ANAEROBIC Blood Culture adequate volume Performed at [facility] [address] [place], [place] [address]    Culture   Final    NO GROWTH 1 DAY Performed at [facility], [place] [address], [place], [place] [address]    Report Status PENDING  Incomplete  SARS CORONAVIRUS 2 (TAT 6-24 HRS) Nasopharyngeal Nasopharyngeal Swab     Status: None   Collection Time: 08/01/19 11:34 AM   Specimen: Nasopharyngeal Swab  Result Value Ref Range Status   SARS Coronavirus 2 NEGATIVE NEGATIVE Final    Comment: (NOTE) SARS-CoV-2 target nucleic acids are NOT DETECTED. The SARS-CoV-2 RNA is generally detectable in upper and lower respiratory specimens during the acute phase of infection.  Negative results do not preclude SARS-CoV-2 infection, do not rule out co-infections with other pathogens, and should not be used as the sole basis for treatment or other patient management decisions. Negative results must be combined with clinical observations, patient history, and epidemiological information. The expected result is Negative. Fact Sheet for Patients: SugarRoll.be Fact Sheet for Healthcare Providers: https://www.woods-mathews.com/ This test is not yet approved or cleared by the [place] FDA and  has been authorized for detection and/or diagnosis of SARS-CoV-2 by FDA under an Emergency Use Authorization (EUA). This EUA will remain  in effect (meaning this test can be used) for the duration of the COVID-19  declaration under Section 56 4(b)(1) of the Act, 21 U.S.C. section 360bbb-3(b)(1), unless the authorization is terminated or revoked sooner. Performed at [facility], [place] [address] [place], [place] [address]          Radiology Studies: Ct Abdomen Pelvis W Contrast  Result Date: 08/01/2019 CLINICAL DATA:  Acute abdominal pain EXAM: CT ABDOMEN AND PELVIS WITH CONTRAST TECHNIQUE: Multidetector CT imaging of the abdomen and pelvis was performed using the standard protocol following bolus administration of intravenous contrast. CONTRAST:  125mL OMNIPAQUE IOHEXOL 300 MG/ML  SOLN COMPARISON:  CT abdomen pelvis dated 08/07/2017 FINDINGS: Lower chest: There is moderate bibasilar atelectasis. Hepatobiliary: No focal liver abnormality is seen. No gallstones, gallbladder wall thickening, or biliary dilatation. Pancreas: Unremarkable. No pancreatic ductal dilatation or surrounding inflammatory changes. Spleen: Normal in size without focal abnormality. Adrenals/Urinary Tract: Adrenal glands are unremarkable. Kidneys are normal, without renal calculi, focal lesion, or hydronephrosis. Bladder is unremarkable. Stomach/Bowel: Stomach  is within normal limits. Appendix appears normal. There is colonic diverticulosis without evidence of diverticulitis. No evidence of bowel wall thickening, distention, or inflammatory changes. Vascular/Lymphatic: Aortic atherosclerosis. No enlarged abdominal or pelvic lymph nodes. Reproductive: Prostate is unremarkable. Other: No abdominal wall hernia or abnormality. No abdominopelvic ascites. Musculoskeletal: A right hip arthroplasty is noted. Streak artifact obscures portions of the pelvis. Degenerative changes are seen in the spine. IMPRESSION: 1. No acute findings in the abdomen or pelvis. 2. Bibasilar atelectasis. Aortic Atherosclerosis (ICD10-I70.0). Electronically Signed   By: Zerita Boers M.D.   On: 08/01/2019 14:35   Dg Chest Port 1 View  Result Date: 08/01/2019 CLINICAL DATA:  Weakness, cough and dizziness for 1 day. EXAM: PORTABLE CHEST 1 VIEW COMPARISON:  04/13/2015 FINDINGS: Normal heart size. No pleural effusion or edema. No airspace opacities identified. Decreased lung volumes. IMPRESSION: No acute cardiopulmonary abnormalities. Electronically Signed   By: Kerby Moors M.D.   On: 08/01/2019 10:03        Scheduled Meds:  aspirin  81 mg Oral Daily   enoxaparin (LOVENOX) injection  40 mg Subcutaneous Q24H   insulin aspart  0-5 Units Subcutaneous QHS   insulin aspart  0-9 Units Subcutaneous TID WC   tamsulosin  0.8 mg Oral QHS   Continuous Infusions:  sodium chloride 75 mL/hr at 08/02/19 1553   cefTRIAXone (ROCEPHIN)  IV 1 g (08/02/19 1721)     LOS: 0 days        Hosie Poisson, MD [facility] 08/02/2019, 5:58 PM

## 2019-08-02 NOTE — Progress Notes (Signed)
PT Cancellation Note  Patient Details Name: James Gates MRN: [id number] DOB: 10-Jan-1951   Cancelled Treatment:    Reason Eval/Treat Not Completed: Other (comment) Pt just ate and c/o nausea.  Request PT to try later.  Will f/u later today. Maggie Font, PT Acute Rehab Services Pager [phone] [facility] 08/02/2019, 11:02 AM

## 2019-08-02 NOTE — Evaluation (Signed)
Physical Therapy Evaluation Patient Details Name: James Gates MRN: [id number] DOB: 05/22/1951 Today's Date: 08/02/2019   History of Present Illness  Pt is  68 y.o. male with medical history significant of BPH, GERD, prediabetes, back surgery, L THA, and sleep apnes. Pt with c/o generalized weakness and admitted with UTI .  Clinical Impression  Pt admitted with above diagnosis. Pt requiring mod A for transfers and gait.  Pt limited due to nausea.  Pt was very unsteady and attempting to sit to early with transfers required increased cues for safety.  At this time recommend SNF; however, pt independent at baseline and expected to progress well and hopefully to HHPT level if able.  Pt currently with functional limitations due to the deficits listed below (see PT Problem List). Pt will benefit from skilled PT to increase their independence and safety with mobility to allow discharge to the venue listed below.       Follow Up Recommendations SNF((SNF at this time, but hopeful for progress to HHPT level ))    Equipment Recommendations  Rolling walker with 5" wheels    Recommendations for Other Services       Precautions / Restrictions Precautions Precautions: Fall      Mobility  Bed Mobility Overal bed mobility: Needs Assistance Bed Mobility: Supine to Sit;Sit to Supine     Supine to sit: Min assist;HOB elevated Sit to supine: Min assist;HOB elevated   General bed mobility comments: assist to scoot forward and to get legs back in bed  Transfers Overall transfer level: Needs assistance Equipment used: Rolling walker (2 wheeled) Transfers: Sit to/from [facility] Sit to Stand: Mod assist Stand pivot transfers: Mod assist       General transfer comment: sit to stand x 3; stand pivot to bsc and back; cues for use of RW and get turned all the way prior to sitting; pt impulsive due to feeling of nausea and sitting too soon requiring assist for safety; required  assist for ADLs as he had to hold RW for balance  Ambulation/Gait Ambulation/Gait assistance: Mod assist Gait Distance (Feet): 3 Feet Assistive device: Rolling walker (2 wheeled) Gait Pattern/deviations: Wide base of support;Decreased stride length     General Gait Details: very unsteady ; mod A for balance; 3'x2; limited by nausea; cues for safety  Stairs            Wheelchair Mobility    Modified Rankin (Stroke Patients Only)       Balance Overall balance assessment: Needs assistance Sitting-balance support: Bilateral upper extremity supported;Feet supported Sitting balance-Leahy Scale: Good     Standing balance support: Bilateral upper extremity supported;During functional activity Standing balance-Leahy Scale: Poor                               Pertinent Vitals/Pain Pain Assessment: No/denies pain    Home Living Family/patient expects to be discharged to:: Private residence Living Arrangements: Spouse/significant other;Children Available Help at Discharge: Family Type of Home: House Home Access: Stairs to enter Entrance Stairs-Rails: Right Entrance Stairs-Number of Steps: 3 Home Layout: Multi-level;Able to live on main level with bedroom/bathroom Home Equipment: Kasandra Knudsen - single point Additional Comments: reports may have a RW    Prior Function Level of Independence: Independent               Hand Dominance        Extremity/Trunk Assessment   Upper Extremity Assessment Upper Extremity  Assessment: Overall WFL for tasks assessed    Lower Extremity Assessment Lower Extremity Assessment: Overall WFL for tasks assessed    Cervical / Trunk Assessment Cervical / Trunk Assessment: Normal  Communication   Communication: No difficulties  Cognition Arousal/Alertness: Awake/alert Behavior During Therapy: WFL for tasks assessed/performed Overall Cognitive Status: Within Functional Limits for tasks assessed                                  General Comments: Pt with c/o "wooziness" - reports like nausea      General Comments      Exercises     Assessment/Plan    PT Assessment Patient needs continued PT services  PT Problem List Decreased strength;Decreased mobility;Decreased safety awareness;Decreased range of motion;Decreased activity tolerance;Decreased balance;Decreased knowledge of use of DME       PT Treatment Interventions DME instruction;Therapeutic activities;Gait training;Therapeutic exercise;Patient/family education;Stair training;Balance training;Functional mobility training    PT Goals (Current goals can be found in the Care Plan section)  Acute Rehab PT Goals Patient Stated Goal: return home PT Goal Formulation: With patient Time For Goal Achievement: 08/16/19 Potential to Achieve Goals: Good    Frequency Min 3X/week   Barriers to discharge        Co-evaluation               AM-PAC PT "6 Clicks" Mobility  Outcome Measure Help needed turning from your back to your side while in a flat bed without using bedrails?: A Little Help needed moving from lying on your back to sitting on the side of a flat bed without using bedrails?: A Little Help needed moving to and from a bed to a chair (including a wheelchair)?: A Lot Help needed standing up from a chair using your arms (e.g., wheelchair or bedside chair)?: A Lot Help needed to walk in hospital room?: A Lot Help needed climbing 3-5 steps with a railing? : A Lot 6 Click Score: 14    End of Session Equipment Utilized During Treatment: Gait belt Activity Tolerance: Other (comment)(limited by nausea) Patient left: in bed;with bed alarm set;with call bell/phone within reach Nurse Communication: Mobility status PT Visit Diagnosis: Unsteadiness on feet (R26.81);Other abnormalities of gait and mobility (R26.89);Muscle weakness (generalized) (M62.81)    Time: [id number] PT Time Calculation (min) (ACUTE ONLY): 6 min   Charges:    PT Evaluation $PT Eval Low Complexity: 1 Low PT Treatments $Therapeutic Activity: 8-22 mins        Maggie Font, PT Acute Rehab Services Pager [phone] [facility] [phone] [facility] [phone]   Karlton Lemon 08/02/2019, 2:04 PM

## 2019-08-03 ENCOUNTER — Ambulatory Visit: Payer: Medicare Other | Admitting: Cardiovascular Disease

## 2019-08-03 ENCOUNTER — Inpatient Hospital Stay (HOSPITAL_COMMUNITY): Payer: Medicare Other

## 2019-08-03 LAB — BASIC METABOLIC PANEL
Anion gap: 12 (ref 5–15)
BUN: 10 mg/dL (ref 8–23)
CO2: 22 mmol/L (ref 22–32)
Calcium: 9.2 mg/dL (ref 8.9–10.3)
Chloride: 107 mmol/L (ref 98–111)
Creatinine, Ser: 1.09 mg/dL (ref 0.61–1.24)
GFR calc Af Amer: >60 mL/min (ref >60)
GFR calc non Af Amer: >60 mL/min (ref >60)
Glucose, Bld: 110 mg/dL — ABNORMAL HIGH (ref 70–99)
Potassium: 3.7 mmol/L (ref 3.5–5.1)
Sodium: 141 mmol/L (ref 135–145)

## 2019-08-03 LAB — GLUCOSE, CAPILLARY
Glucose-Capillary: 106 mg/dL — ABNORMAL HIGH (ref 70–99)
Glucose-Capillary: 126 mg/dL — ABNORMAL HIGH (ref 70–99)
Glucose-Capillary: 127 mg/dL — ABNORMAL HIGH (ref 70–99)

## 2019-08-03 LAB — CBC WITH DIFFERENTIAL/PLATELET
Abs Immature Granulocytes: 0.02 10*3/uL (ref 0.00–0.07)
Basophils Absolute: 0 10*3/uL (ref 0.0–0.1)
Basophils Relative: 0 %
Eosinophils Absolute: 0.1 10*3/uL (ref 0.0–0.5)
Eosinophils Relative: 1 %
HCT: 47 % (ref 39.0–52.0)
Hemoglobin: 15.1 g/dL (ref 13.0–17.0)
Immature Granulocytes: 0 %
Lymphocytes Relative: 18 %
Lymphs Abs: 1.3 10*3/uL (ref 0.7–4.0)
MCH: 29.3 pg (ref 26.0–34.0)
MCHC: 32.1 g/dL (ref 30.0–36.0)
MCV: 91.3 fL (ref 80.0–100.0)
Monocytes Absolute: 0.7 10*3/uL (ref 0.1–1.0)
Monocytes Relative: 11 %
Neutro Abs: 4.8 10*3/uL (ref 1.7–7.7)
Neutrophils Relative %: 70 %
Platelets: 144 10*3/uL — ABNORMAL LOW (ref 150–400)
RBC: 5.15 MIL/uL (ref 4.22–5.81)
RDW: 12.7 % (ref 11.5–15.5)
WBC: 6.9 10*3/uL (ref 4.0–10.5)
nRBC: 0 % (ref 0.0–0.2)

## 2019-08-03 MED ORDER — LORAZEPAM 2 MG/ML IJ SOLN
1.0000 mg | Freq: Once | INTRAMUSCULAR | Status: AC
  Administered 2019-08-03: 14:00:00 2 mg via INTRAVENOUS
  Filled 2019-08-03: qty 1

## 2019-08-03 NOTE — Progress Notes (Signed)
Report called to [facility] on 4E, will wait to see if MRI is done before transport.

## 2019-08-03 NOTE — Evaluation (Addendum)
Occupational Therapy Evaluation Patient Details Name: James Gates MRN: [id number] DOB: [birth year] Today's Date: 08/03/2019    History of Present Illness Pt is  68 y.o. male with medical history significant of BPH, GERD, prediabetes, back surgery, L THA, and sleep apnes. Pt with c/o generalized weakness and admitted with UTI .  MRI brain: "Normal appearance of the brain for age. Very minimal small vesselchange of the hemispheric white matter. Small right mastoid effusion."   Clinical Impression   Pt admitted with lactic acidosis. Pt currently with functional limitations due to the deficits listed below (see OT Problem List).  Pt will benefit from skilled OT to increase their safety and independence with ADL and functional mobility for ADL to facilitate discharge to venue listed below.      Follow Up Recommendations  Outpatient OT;Home health OT(depending on progress) (vestibular)    Equipment Recommendations  None recommended by OT    Recommendations for Other Services       Precautions / Restrictions Precautions Precautions: Fall      Mobility Bed Mobility Overal bed mobility: Needs Assistance Bed Mobility: Supine to Sit;Sit to Supine     Supine to sit: HOB elevated;Supervision Sit to supine: Min assist;HOB elevated   General bed mobility comments: slow mobility due to dizziness/"wooziness" with moving, assist for LEs onto bed  Transfers Overall transfer level: Needs assistance Equipment used: Rolling walker (2 wheeled) Transfers: Sit to/from Stand Sit to Stand: Min guard         General transfer comment: verbal cues for safe technique, increased effort however min/guard for safety, cues for backing up to bed prior to sitting    Balance Overall balance assessment: Needs assistance Sitting-balance support: Bilateral upper extremity supported;Feet supported Sitting balance-Leahy Scale: Good     Standing balance support: Bilateral upper extremity  supported;During functional activity Standing balance-Leahy Scale: Poor Standing balance comment: requires UE support                           ADL either performed or assessed with clinical judgement   ADL Overall ADL's : Needs assistance/impaired Eating/Feeding: Set up;Sitting   Grooming: Wash/dry face;Oral care;Sitting;Set up   Upper Body Bathing: Set up;Sitting   Lower Body Bathing: Moderate assistance;Sit to/from stand;Cueing for safety;With adaptive equipment   Upper Body Dressing : Set up;Sitting   Lower Body Dressing: Maximal assistance;Sit to/from stand;Cueing for sequencing;Cueing for safety   Toilet Transfer: Minimal assistance;Stand-pivot;Cueing for safety;Moderate assistance;Cueing for sequencing   Toileting- Clothing Manipulation and Hygiene: Moderate assistance;Sit to/from stand;Cueing for sequencing;Cueing for safety         General ADL Comments: pt agreeable to OOB.  Pt did complain of dizziness.     Vision Patient Visual Report: No change from baseline Vision Assessment?: No apparent visual deficits Additional Comments: Of note with tracking OT did notice some trouble with tracking but pt did not feel this was a deficit.  He did state watching the TV was difficult a few days ago but was better now. OT did communicate with PT regardingpossible vestibular eval.            Pertinent Vitals/Pain Pain Assessment: No/denies pain     Hand Dominance     Extremity/Trunk Assessment Upper Extremity Assessment Upper Extremity Assessment: Generalized weakness       Cervical / Trunk Assessment Cervical / Trunk Assessment: Normal   Communication Communication Communication: No difficulties   Cognition Arousal/Alertness: Awake/alert Behavior During Therapy: WFL for tasks  assessed/performed Overall Cognitive Status: Within Functional Limits for tasks assessed                                     General Comments  Pt with left  lateral head tilt at rest.  Pt with right beating horizontal nystagmus at rest.  No changes to nystagmus with smooth pursuits.  Pt reports wooziness and nausea with right saccades and possible mild overcorrection observed during left saccades.  Pt reports hearing palpitations in left ear.  Pt denies significant changes to hearing (hx of hearing loss) and denies pressure sensation.   Pt reports symptoms started a few weeks ago however intensified with nausea and diarrhea leading to admission.            Home Living Family/patient expects to be discharged to:: Private residence Living Arrangements: Spouse/significant other;Children Available Help at Discharge: Family Type of Home: House Home Access: Stairs to enter Technical brewer of Steps: 3 Entrance Stairs-Rails: Right Home Layout: Multi-level;Able to live on main level with bedroom/bathroom   Alternate Level Stairs-Rails: Right Bathroom Shower/Tub: Tub/shower unit;Walk-in shower   Bathroom Toilet: Standard     Home Equipment: Kasandra Knudsen - single point   Additional Comments: reports may have a RW      Prior Functioning/Environment Level of Independence: Independent                 OT Problem List: Decreased strength;Decreased safety awareness;Decreased activity tolerance;Impaired balance (sitting and/or standing)      OT Treatment/Interventions: Self-care/ADL training;Patient/family education;DME and/or AE instruction    OT Goals(Current goals can be found in the care plan section) Acute Rehab OT Goals Patient Stated Goal: return home OT Goal Formulation: With patient Time For Goal Achievement: 08/10/19  OT Frequency: Min 2X/week   Barriers to D/C:            Co-evaluation              AM-PAC OT "6 Clicks" Daily Activity     Outcome Measure Help from another person eating meals?: A Little Help from another person taking care of personal grooming?: A Little Help from another person toileting, which  includes using toliet, bedpan, or urinal?: A Little Help from another person bathing (including washing, rinsing, drying)?: A Little Help from another person to put on and taking off regular upper body clothing?: A Little Help from another person to put on and taking off regular lower body clothing?: A Little 6 Click Score: 18   End of Session Equipment Utilized During Treatment: Rolling walker;Gait belt Nurse Communication: Mobility status  Activity Tolerance: Patient tolerated treatment well Patient left: in chair;with call bell/phone within reach;with chair alarm set  OT Visit Diagnosis: Unsteadiness on feet (R26.81);Other abnormalities of gait and mobility (R26.89);Muscle weakness (generalized) (M62.81)                Time: 1005-1030 OT Time Calculation (min): 25 min Charges:  OT General Charges $OT Visit: 1 Visit OT Evaluation $OT Eval Moderate Complexity: 1 Mod  Kari Baars, [place] [phone] Office- [phone], Edwena Felty D 08/03/2019, 4:49 PM

## 2019-08-03 NOTE — Progress Notes (Signed)
Physical Therapy Treatment Patient Details Name: James Gates MRN: [id number] DOB: May 11, 1951 Today's Date: 08/03/2019    History of Present Illness Pt is  68 y.o. male with medical history significant of BPH, GERD, prediabetes, back surgery, L THA, and sleep apnes. Pt with c/o generalized weakness and admitted with UTI .  MRI brain: "Normal appearance of the brain for age. Very minimal small vesselchange of the hemispheric white matter. Small right mastoid effusion."    PT Comments    Pt with right beating horizontal nystagmus at rest.  Pt reports "wooziness" and nausea with movement which improves with rest and remaining still.  Attempted vestibular evaluation (see below) however pt with constant nystagmus at rest limiting testing.   Follow Up Recommendations  SNF;Supervision/Assistance - 24 hour     Equipment Recommendations  Rolling walker with 5" wheels    Recommendations for Other Services       Precautions / Restrictions Precautions Precautions: Fall    Mobility  Bed Mobility Overal bed mobility: Needs Assistance Bed Mobility: Supine to Sit;Sit to Supine     Supine to sit: HOB elevated;Supervision Sit to supine: Min assist;HOB elevated   General bed mobility comments: slow mobility due to dizziness/"wooziness" with moving, assist for LEs onto bed  Transfers Overall transfer level: Needs assistance Equipment used: Rolling walker (2 wheeled) Transfers: Sit to/from Stand Sit to Stand: Min guard         General transfer comment: verbal cues for safe technique, increased effort however min/guard for safety, cues for backing up to bed prior to sitting  Ambulation/Gait Ambulation/Gait assistance: Min assist Gait Distance (Feet): 8 Feet Assistive device: Rolling walker (2 wheeled) Gait Pattern/deviations: Wide base of support;Decreased stride length Gait velocity: decr   General Gait Details: very wide BOS due to feeling unstable, reports wooziness and nausea  limiting; provided cues for focusing on stationary object   Stairs             Wheelchair Mobility    Modified Rankin (Stroke Patients Only)       Balance Overall balance assessment: Needs assistance         Standing balance support: Bilateral upper extremity supported;During functional activity Standing balance-Leahy Scale: Poor Standing balance comment: requires UE support                            Cognition Arousal/Alertness: Awake/alert Behavior During Therapy: WFL for tasks assessed/performed Overall Cognitive Status: Within Functional Limits for tasks assessed                                        Exercises      General Comments/Vestibular exam General comments (skin integrity, edema, etc.): Pt with left lateral head tilt at rest.  Pt with right beating horizontal nystagmus at rest.  No changes to nystagmus with smooth pursuits.  Pt reports wooziness and nausea with right saccades and possible mild overcorrection observed during left saccades.  Pt reports hearing palpitations in left ear.  Pt denies significant changes to hearing (hx of hearing loss) and denies pressure sensation.   Pt reports symptoms started a few weeks ago however intensified with nausea and diarrhea leading to admission.      Pertinent Vitals/Pain Pain Assessment: No/denies pain    Home Living Family/patient expects to be discharged to:: Private residence Living Arrangements: Spouse/significant other;Children Available Help  at Discharge: Family Type of Home: House Home Access: Stairs to enter Entrance Stairs-Rails: Right Home Layout: Multi-level;Able to live on main level with bedroom/bathroom Home Equipment: Kasandra Knudsen - single point Additional Comments: reports may have a RW    Prior Function Level of Independence: Independent          PT Goals (current goals can now be found in the care plan section) Progress towards PT goals: Progressing toward  goals    Frequency    Min 3X/week      PT Plan Current plan remains appropriate    Co-evaluation              AM-PAC PT "6 Clicks" Mobility   Outcome Measure  Help needed turning from your back to your side while in a flat bed without using bedrails?: A Little Help needed moving from lying on your back to sitting on the side of a flat bed without using bedrails?: A Little Help needed moving to and from a bed to a chair (including a wheelchair)?: A Little Help needed standing up from a chair using your arms (e.g., wheelchair or bedside chair)?: A Little Help needed to walk in hospital room?: A Little Help needed climbing 3-5 steps with a railing? : A Lot 6 Click Score: 17    End of Session Equipment Utilized During Treatment: Gait belt Activity Tolerance: Patient tolerated treatment well Patient left: in bed;with call bell/phone within reach;with bed alarm set Nurse Communication: Mobility status PT Visit Diagnosis: Unsteadiness on feet (R26.81);Other abnormalities of gait and mobility (R26.89)     Time: 1500-1535 PT Time Calculation (min) (ACUTE ONLY): 35 min  Charges:  $Gait Training: 8-22 mins $Physical Performance Test: 8-22 mins                     Carmelia Bake, PT, DPT Acute Rehabilitation Services Office: [phone] Pager: [phone]   Trena Platt 08/03/2019, 4:37 PM

## 2019-08-03 NOTE — Progress Notes (Signed)
PROGRESS NOTE    James Gates  [id number] DOB: [birth year] DOA: 08/01/2019 PCP: Bartholome Bill, MD    Brief Narrative:  68 year old gentleman with prior h/o pre diabetes, GERD, BPh, presents with nausea, decreased oral intake, generalized weakness. He was found to have UTI, . He was started on IV antibiotics. CT abd and pelvis does not show any acute findings.    Assessment & Plan:   Active Problems:   Lactic acidosis    lactic acidosis:  Probably from dehydration .  Resolved with hydration.    UTI:  Currently on IV Rocephin, follow urine cultures and sensitivities.  Generalized weakness, persistent nausea and dizziness:  Liver enzymes wnl.  Could be sec to UTI.  Orthostatic vital signs within normal limits.  Patient reports persistent dizziness vertigo, going on for a few days now.  Will get MRI of the brain without contrast for evaluation of stroke. Get vestibular PT evaluation for BPPV evaluation. PT evaluation recommending SNF,'s clinical social worker on board for SNF placement.  GERD Stable.    History of palpitations Patient was scheduled to see Dr. Sallyanne Kuster in the office for palpitations but patient currently denies any palpitations. Transfer the patient to telemetry and monitor on telemetry overnight and call cardiology if needed.  Patient's wife is requesting to see cardiology while he is in the hospital  BPH:  Stable.    DVT prophylaxis: lovenox.  Code Status: full code.  Family Communication: not at bedside.  Disposition Plan: not stable for discharge home, CSW consulted for SNF placement.    Consultants:   None.    Procedures: none.   Antimicrobials: rocephin for UTI.   Subjective: Patient reports persistent dizziness associated with nausea and unable to ambulate.  He also reports headache.  No blurry vision no tingling or numbness or focal deficits.  Objective: Vitals:   08/03/19 1028 08/03/19 1318 08/03/19 1438 08/03/19 1517   BP: (!) 153/84 138/80 133/79 (!) 141/90  Pulse: 70 74 81 81  Resp:  18 16 (!) 24  Temp:  98.3 F (36.8 C) 99.4 F (37.4 C)   TempSrc:  Oral Oral   SpO2:  100% 94% 97%  Weight:      Height:        Intake/Output Summary (Last 24 hours) at 08/03/2019 1640 Last data filed at 08/03/2019 1300 Gross per 24 hour  Intake 1534.06 ml  Output 1052 ml  Net 482.06 ml   Filed Weights   08/01/19 0837 08/01/19 0841 08/01/19 2024  Weight: 96.2 kg 96.2 kg 102.3 kg    Examination:  General exam: Appears uncomfortable but not in acute distress. Respiratory system: Clear to auscultation bilaterally, no wheezing or rhonchi Cardiovascular system: S1-S2 heard, regular rate rhythm, no pedal edema Gastrointestinal system: Abdomen is soft, nontender, nondistended, bowel sounds normal Central nervous system: Alert and oriented, no focal deficits evident. Extremities: No cyanosis or clubbing Skin: No rashes Psychiatry: mood is  Appropriate    Data Reviewed: I have personally reviewed following labs and imaging studies  CBC: Recent Labs  Lab 08/01/19 0857 08/02/19 0511 08/03/19 0850  WBC 12.8* 8.0 6.9  NEUTROABS 10.8*  --  4.8  HGB 15.7 14.0 15.1  HCT 49.2 44.4 47.0  MCV 92.8 93.1 91.3  PLT 154 138* 123456*   Basic Metabolic Panel: Recent Labs  Lab 08/01/19 0857 08/02/19 0511 08/03/19 0850  NA 142 143 141  K 3.3* 3.9 3.7  CL 107 111 107  CO2 21* 23 22  GLUCOSE 210* 105* 110*  BUN 19 13 10   CREATININE 1.27* 1.15 1.09  CALCIUM 9.1 8.8* 9.2   GFR: Estimated Creatinine Clearance: 75.4 mL/min (by C-G formula based on SCr of 1.09 mg/dL). Liver Function Tests: Recent Labs  Lab 08/01/19 0857 08/02/19 0511  AST 37 38  ALT 39 31  ALKPHOS 70 60  BILITOT 1.5* 1.5*  PROT 7.5 6.0*  ALBUMIN 4.5 3.4*   No results for input(s): LIPASE, AMYLASE in the last 168 hours. No results for input(s): AMMONIA in the last 168 hours. Coagulation Profile: No results for input(s): INR, PROTIME in  the last 168 hours. Cardiac Enzymes: No results for input(s): CKTOTAL, CKMB, CKMBINDEX, TROPONINI in the last 168 hours. BNP (last 3 results) No results for input(s): PROBNP in the last 8760 hours. HbA1C: Recent Labs    08/02/19 0511  HGBA1C 6.1*   CBG: Recent Labs  Lab 08/02/19 1120 08/02/19 1628 08/02/19 2122 08/03/19 0727 08/03/19 1126  GLUCAP 108* 102* 106* 106* 126*   Lipid Profile: Recent Labs    08/02/19 0511  TRIG 103   Thyroid Function Tests: No results for input(s): TSH, T4TOTAL, FREET4, T3FREE, THYROIDAB in the last 72 hours. Anemia Panel: Recent Labs    08/01/19 0857  FERRITIN 197   Sepsis Labs: Recent Labs  Lab 08/01/19 0857 08/01/19 1428 08/01/19 1628 08/02/19 0511  PROCALCITON <0.10  --   --   --   LATICACIDVEN 7.5* 4.3* 2.8* 1.0    Recent Results (from the past 240 hour(s))  Blood Culture (routine x 2)     Status: None (Preliminary result)   Collection Time: 08/01/19  8:57 AM   Specimen: BLOOD  Result Value Ref Range Status   Specimen Description   Final    BLOOD RIGHT ANTECUBITAL Performed at [facility], [place] [address], [place], [place] [address]    Special Requests   Final    BOTTLES DRAWN AEROBIC AND ANAEROBIC Blood Culture adequate volume Performed at [facility] [address] [place], [place] [address]    Culture   Final    NO GROWTH 2 DAYS Performed at [facility] [address] [place], [place] [address]    Report Status PENDING  Incomplete  Blood Culture (routine x 2)     Status: None (Preliminary result)   Collection Time: 08/01/19  9:02 AM   Specimen: BLOOD RIGHT FOREARM  Result Value Ref Range Status   Specimen Description   Final    BLOOD RIGHT FOREARM Performed at [facility], [place] [address] [place], [place] [address]    Special Requests   Final    BOTTLES DRAWN AEROBIC AND ANAEROBIC Blood Culture adequate volume Performed at [facility] [address], [place], [place] [address]    Culture   Final    NO GROWTH 2 DAYS Performed at [facility] [address], [place], [place] [address]    Report Status PENDING  Incomplete  SARS CORONAVIRUS 2 (TAT 6-24 HRS) Nasopharyngeal Nasopharyngeal Swab     Status: None   Collection Time: 08/01/19 11:34 AM   Specimen: Nasopharyngeal Swab  Result Value Ref Range Status   SARS Coronavirus 2 NEGATIVE NEGATIVE Final    Comment: (NOTE) SARS-CoV-2 target nucleic acids are NOT DETECTED. The SARS-CoV-2 RNA is generally detectable in upper and lower respiratory specimens during the acute phase of infection. Negative results do not preclude SARS-CoV-2 infection, do not rule out co-infections  with other pathogens, and should not be used as the sole basis for treatment or other patient management decisions. Negative results must be combined with clinical observations, patient history, and epidemiological information. The expected result is Negative. Fact Sheet for Patients: SugarRoll.be Fact Sheet for Healthcare Providers: https://www.woods-mathews.com/ This test is not yet approved or cleared by the [place] FDA and  has been authorized for detection and/or diagnosis of SARS-CoV-2 by FDA under an Emergency Use Authorization (EUA). This EUA will remain  in effect (meaning this test can be used) for the duration of the COVID-19 declaration under Section 56 4(b)(1) of the Act, 21 U.S.C. section 360bbb-3(b)(1), unless the authorization is terminated or revoked sooner. Performed at [facility], [place] [address], [place], [place] [address]   Urine culture     Status: Abnormal (Preliminary result)   Collection Time: 08/01/19  3:25 PM   Specimen: Urine, Clean Catch  Result Value Ref Range Status   Specimen Description   Final    URINE, CLEAN CATCH Performed at [facility], [place] [address]  [place], [place] [address]    Special Requests   Final    NONE Performed at [facility], [place] [address], [place], [place] [address]    Culture >=100,000 COLONIES/mL STAPHYLOCOCCUS EPIDERMIDIS (A)  Final   Report Status PENDING  Incomplete         Radiology Studies: Mr Brain Wo Contrast  Result Date: 08/03/2019 CLINICAL DATA:  Ataxia.  Acute stroke suspected.  Confusion. EXAM: MRI HEAD WITHOUT CONTRAST TECHNIQUE: Multiplanar, multiecho pulse sequences of the brain and surrounding structures were obtained without intravenous contrast. COMPARISON:  None. FINDINGS: Brain: Diffusion imaging does not show any acute or subacute infarction. Brainstem and cerebellum are normal. There are minimal small vessel changes of the hemispheric white matter, less than often seen at this age. No cortical or large vessel territory infarction. No mass lesion, hemorrhage, hydrocephalus or extra-axial collection. Vascular: Major vessels at the base of the brain show flow. Skull and upper cervical spine: Negative Sinuses/Orbits: Clear/normal.  Small right mastoid effusion. Other: None IMPRESSION: Normal appearance of the brain for age. Very minimal small vessel change of the hemispheric white matter. Small right mastoid effusion. Electronically Signed   By: Nelson Chimes M.D.   On: 08/03/2019 14:42        Scheduled Meds: . aspirin  81 mg Oral Daily  . enoxaparin (LOVENOX) injection  40 mg Subcutaneous Q24H  . insulin aspart  0-5 Units Subcutaneous QHS  . insulin aspart  0-9 Units Subcutaneous TID WC  . pantoprazole  40 mg Oral Q0600  . tamsulosin  0.8 mg Oral QHS   Continuous Infusions: . cefTRIAXone (ROCEPHIN)  IV Stopped (08/02/19 2133)     LOS: 1 day        Hosie Poisson, MD [facility] 08/03/2019, 4:40 PM

## 2019-08-03 NOTE — Plan of Care (Signed)
  Problem: Health Behavior/Discharge Planning: Goal: Ability to manage health-related needs will improve Outcome: Progressing   Problem: Clinical Measurements: Goal: Will remain free from infection Outcome: Progressing   Problem: Activity: Goal: Risk for activity intolerance will decrease Outcome: Progressing   Problem: Nutrition: Goal: Adequate nutrition will be maintained Outcome: Progressing   Problem: Safety: Goal: Ability to remain free from injury will improve Outcome: Progressing

## 2019-08-04 DIAGNOSIS — A419 Sepsis, unspecified organism: Secondary | ICD-10-CM

## 2019-08-04 LAB — GLUCOSE, CAPILLARY
Glucose-Capillary: 111 mg/dL — ABNORMAL HIGH (ref 70–99)
Glucose-Capillary: 109 mg/dL — ABNORMAL HIGH (ref 70–99)
Glucose-Capillary: 125 mg/dL — ABNORMAL HIGH (ref 70–99)
Glucose-Capillary: 105 mg/dL — ABNORMAL HIGH (ref 70–99)

## 2019-08-04 MED ORDER — CHLORHEXIDINE GLUCONATE CLOTH 2 % EX PADS
6.0000 | MEDICATED_PAD | Freq: Every day | CUTANEOUS | Status: DC
  Administered 2019-08-05 – 2019-08-06 (×2): 6 via TOPICAL

## 2019-08-04 NOTE — NC FL2 (Signed)
[place] LEVEL OF CARE SCREENING TOOL     IDENTIFICATION  Patient Name: James Gates Birthdate: 1950/10/10 Sex: male Admission Date (Current Location): 08/01/2019  [facility] and [place] Number:  Herbalist and Address:  [facility],  [employer] [address], [place]      Provider Number: [id number]  Attending Physician Name and Address:  Elgergawy, Silver Huguenin, MD  Relative Name and Phone Number:  Revanth Trabucco [phone]    Current Level of Care: Hospital Recommended Level of Care: [place] Prior Approval Number:    Date Approved/Denied:   PASRR Number: [id number]  Discharge Plan: SNF    Current Diagnoses: Patient Active Problem List   Diagnosis Date Noted  . Lactic acidosis 08/01/2019  . HNP (herniated nucleus pulposus), lumbar 02/16/2018  . Lumbar stenosis with neurogenic claudication 12/20/2015  . Primary osteoarthritis of right hip 04/23/2015  . Enlarged prostate 12/14/2014  . Obstructive apnea 12/14/2014  . Tinea cruris 01/24/2011  . DERMATITIS, SEBORRHEIC NEC 05/21/2007  . ACTINIC KERATOSIS, HEAD 05/21/2007  . SPINAL STENOSIS, LUMBAR 05/21/2007  . COLONIC POLYPS, HX OF 05/21/2007  . HYPERLIPIDEMIA 05/14/2007    Orientation RESPIRATION BLADDER Height & Weight     Self, Time, Situation, Place  Normal Continent Weight: 102.3 kg Height:  5\' 9"  (175.3 cm)  BEHAVIORAL SYMPTOMS/MOOD NEUROLOGICAL BOWEL NUTRITION STATUS      Continent Diet(soft)  AMBULATORY STATUS COMMUNICATION OF NEEDS Skin   Limited Assist Verbally Normal                       Personal Care Assistance Level of Assistance  Bathing, Feeding, Dressing Bathing Assistance: Limited assistance Feeding assistance: Limited assistance Dressing Assistance: Limited assistance     Functional Limitations Info  Sight, Hearing, Speech Sight Info: Adequate Hearing Info: Adequate Speech Info: Adequate    SPECIAL CARE FACTORS FREQUENCY   PT (By licensed PT), OT (By licensed OT)     PT Frequency: 5x week OT Frequency: 5x week            Contractures Contractures Info: Not present    Additional Factors Info  Code Status, Insulin Sliding Scale Code Status Info: Full code     Insulin Sliding Scale Info: CBG SSI       Current Medications (08/04/2019):  This is the current hospital active medication list Current Facility-Administered Medications  Medication Dose Route Frequency Provider Last Rate Last Dose  . acetaminophen (TYLENOL) tablet 650 mg  650 mg Oral Q6H PRN Hosie Poisson, MD       Or  . acetaminophen (TYLENOL) suppository 650 mg  650 mg Rectal Q6H PRN Hosie Poisson, MD      . aspirin chewable tablet 81 mg  81 mg Oral Daily Hosie Poisson, MD   81 mg at 08/04/19 1011  . cefTRIAXone (ROCEPHIN) 1 g in sodium chloride 0.9 % 100 mL IVPB  1 g Intravenous Q24H Hosie Poisson, MD   Stopped at 08/03/19 2146  . enoxaparin (LOVENOX) injection 40 mg  40 mg Subcutaneous Q24H Hosie Poisson, MD   40 mg at 08/03/19 2146  . insulin aspart (novoLOG) injection 0-5 Units  0-5 Units Subcutaneous QHS Hosie Poisson, MD      . insulin aspart (novoLOG) injection 0-9 Units  0-9 Units Subcutaneous TID WC Hosie Poisson, MD   1 Units at 08/03/19 1758  . ondansetron (ZOFRAN) tablet 4 mg  4 mg Oral Q6H PRN Hosie Poisson, MD  Or  . ondansetron (ZOFRAN) injection 4 mg  4 mg Intravenous Q6H PRN Hosie Poisson, MD      . pantoprazole (PROTONIX) EC tablet 40 mg  40 mg Oral Q0600 Hosie Poisson, MD   40 mg at 08/04/19 0622  . tamsulosin (FLOMAX) capsule 0.8 mg  0.8 mg Oral QHS Hosie Poisson, MD   0.8 mg at 08/03/19 2146     Discharge Medications: Please see discharge summary for a list of discharge medications.  Relevant Imaging Results:  Relevant Lab Results:   Additional Information SS#[id number]  Liba Hulsey, Juliann Pulse, RN

## 2019-08-04 NOTE — Progress Notes (Signed)
Physical Therapy Treatment Patient Details Name: James Gates MRN: [id number] DOB: 03-25-1951 Today's Date: 08/04/2019    History of Present Illness Pt is  68 y.o. male with medical history significant of BPH, GERD, prediabetes, back surgery, L THA, and sleep apnes. Pt with c/o generalized weakness and admitted with UTI .  MRI brain: "Normal appearance of the brain for age. Very minimal small vesselchange of the hemispheric white matter. Small right mastoid effusion."    PT Comments    Pt continues to present with right beating horizontal nystagmus at rest however appears improved today.  Pt also only reports 2/10 dizziness with mobility and able to ambulate in hallway today.  Updated recommendations for HHPT and supervision for mobility for safety.      Follow Up Recommendations  Home health PT;Supervision for mobility/OOB     Equipment Recommendations  Rolling walker with 5" wheels    Recommendations for Other Services       Precautions / Restrictions Precautions Precautions: Fall    Mobility  Bed Mobility Overal bed mobility: Needs Assistance Bed Mobility: Supine to Sit     Supine to sit: HOB elevated;Supervision     General bed mobility comments: pt in recliner  Transfers Overall transfer level: Needs assistance Equipment used: Rolling walker (2 wheeled) Transfers: Sit to/from Stand Sit to Stand: Min guard Stand pivot transfers: Min assist       General transfer comment: verbal cues for safe technique, increased effort however min/guard for safety  Ambulation/Gait Ambulation/Gait assistance: Min guard Gait Distance (Feet): 160 Feet Assistive device: Rolling walker (2 wheeled) Gait Pattern/deviations: Step-through pattern;Decreased stride length;Trunk flexed     General Gait Details: improved BOS today, only slightly wide, cues for safe use of RW, cues for focusing on stationary objects and turning slowly as a unit; pt reports 2/10 dizziness  today   Stairs             Wheelchair Mobility    Modified Rankin (Stroke Patients Only)       Balance Overall balance assessment: Needs assistance Sitting-balance support: Bilateral upper extremity supported;Feet supported Sitting balance-Leahy Scale: Good     Standing balance support: Bilateral upper extremity supported;During functional activity Standing balance-Leahy Scale: Poor                              Cognition Arousal/Alertness: Awake/alert Behavior During Therapy: WFL for tasks assessed/performed Overall Cognitive Status: Within Functional Limits for tasks assessed                                        Exercises      General Comments        Pertinent Vitals/Pain Pain Assessment: No/denies pain    Home Living                      Prior Function            PT Goals (current goals can now be found in the care plan section) Acute Rehab PT Goals Patient Stated Goal: return home Progress towards PT goals: Progressing toward goals    Frequency    Min 3X/week      PT Plan Discharge plan needs to be updated    Co-evaluation              AM-PAC PT "6  Clicks" Mobility   Outcome Measure  Help needed turning from your back to your side while in a flat bed without using bedrails?: A Little Help needed moving from lying on your back to sitting on the side of a flat bed without using bedrails?: A Little Help needed moving to and from a bed to a chair (including a wheelchair)?: A Little Help needed standing up from a chair using your arms (e.g., wheelchair or bedside chair)?: A Little Help needed to walk in hospital room?: A Little Help needed climbing 3-5 steps with a railing? : A Lot 6 Click Score: 17    End of Session Equipment Utilized During Treatment: Gait belt Activity Tolerance: Patient tolerated treatment well Patient left: in chair;with call bell/phone within reach;with chair alarm set    PT Visit Diagnosis: Unsteadiness on feet (R26.81);Other abnormalities of gait and mobility (R26.89)     Time: [id number] PT Time Calculation (min) (ACUTE ONLY): 18 min  Charges:  $Gait Training: 8-22 mins                     Carmelia Bake, PT, [place] Office: [phone] Pager: [phone]  Trena Platt 08/04/2019, 3:58 PM

## 2019-08-04 NOTE — TOC Initial Note (Signed)
Transition of Care [facility]) - Initial/Assessment Note    Patient Details  Name: James Gates MRN: [id number] Date of Birth: [birth year]  [facility] CM/SW Contact:    Dessa Phi, RN Phone Number: 08/04/2019, 3:55 PM  Clinical Narrative: Patient prefers home w/HHC,but willing to let CM fax out to [facility] has accepted-rep Ronalee Belts following for [facility].                  Expected Discharge Plan: [place] Barriers to Discharge: Continued Medical Work up   Patient Goals and CMS Choice Patient states their goals for this hospitalization and ongoing recovery are:: go home CMS Medicare.gov Compare Post Acute Care list provided to:: Patient Choice offered to / list presented to : Patient  Expected Discharge Plan and Services Expected Discharge Plan: [place]   Discharge Planning Services: CM Consult Post Acute Care Choice: [place] arrangements for the past 2 months: Single Family Home                                      Prior Living Arrangements/Services Living arrangements for the past 2 months: Single Family Home Lives with:: Spouse Patient language and need for interpreter reviewed:: Yes Do you feel safe going back to the place where you live?: Yes      Need for Family Participation in Patient Care: No (Comment) Care giver support system in place?: Yes (comment) Current home services: DME(cane,rw-may be old Son will confirm) Criminal Activity/Legal Involvement Pertinent to Current Situation/Hospitalization: No - Comment as needed  Activities of Daily Living Home Assistive Devices/Equipment: None ADL Screening (condition at time of admission) Patient's cognitive ability adequate to safely complete daily activities?: Yes Is the patient deaf or have difficulty hearing?: No Does the patient have difficulty seeing, even when wearing glasses/contacts?: No Does the patient have difficulty concentrating, remembering,  or making decisions?: No Patient able to express need for assistance with ADLs?: Yes Does the patient have difficulty dressing or bathing?: No Independently performs ADLs?: Yes (appropriate for developmental age) Does the patient have difficulty walking or climbing stairs?: No Weakness of Legs: Both Weakness of Arms/Hands: None  Permission Sought/Granted Permission sought to share information with : Case Manager Permission granted to share information with : Yes, Verbal Permission Granted              Emotional Assessment Appearance:: Appears stated age Attitude/Demeanor/Rapport: Gracious Affect (typically observed): Accepting Orientation: : Oriented to Self, Oriented to Place, Oriented to  Time, Oriented to Situation Alcohol / Substance Use: Not Applicable Psych Involvement: No (comment)  Admission diagnosis:  Urinary tract infection without hematuria, site unspecified [N39.0] Sepsis, due to unspecified organism, unspecified whether acute organ dysfunction present [facility]) [A41.9] Patient Active Problem List   Diagnosis Date Noted  . Lactic acidosis 08/01/2019  . HNP (herniated nucleus pulposus), lumbar 02/16/2018  . Lumbar stenosis with neurogenic claudication 12/20/2015  . Primary osteoarthritis of right hip 04/23/2015  . Enlarged prostate 12/14/2014  . Obstructive apnea 12/14/2014  . Tinea cruris 01/24/2011  . DERMATITIS, SEBORRHEIC NEC 05/21/2007  . ACTINIC KERATOSIS, HEAD 05/21/2007  . SPINAL STENOSIS, LUMBAR 05/21/2007  . COLONIC POLYPS, HX OF 05/21/2007  . HYPERLIPIDEMIA 05/14/2007   PCP:  Bartholome Bill, MD Pharmacy:   [id number] [place], [place] [address] Phone: [phone] Fax: [phone]  Social Determinants of Health (SDOH) Interventions    Readmission Risk Interventions No flowsheet data found.

## 2019-08-04 NOTE — Progress Notes (Signed)
Occupational Therapy Treatment Patient Details Name: James Gates MRN: [id number] DOB: 1951/07/26 Today's Date: 08/04/2019    History of present illness Pt is  68 y.o. male with medical history significant of BPH, GERD, prediabetes, back surgery, L THA, and sleep apnes. Pt with c/o generalized weakness and admitted with UTI .  MRI brain: "Normal appearance of the brain for age. Very minimal small vesselchange of the hemispheric white matter. Small right mastoid effusion."   OT comments  Pt may need to go home at [facility] level for safety depending on care at home.  Son can a as well as wife- but unsure amount of A.  Pt does not have to go to a SNF  Follow Up Recommendations  Home health OT;Supervision/Assistance - 24 hour(depending on progress)    Equipment Recommendations  3 in 1 bedside commode       Precautions / Restrictions    falls     Mobility Bed Mobility Overal bed mobility: Needs Assistance Bed Mobility: Supine to Sit     Supine to sit: HOB elevated;Supervision        Transfers Overall transfer level: Needs assistance Equipment used: Rolling walker (2 wheeled) Transfers: Sit to/from [facility] Sit to Stand: Min assist Stand pivot transfers: Min assist            Balance Overall balance assessment: Needs assistance Sitting-balance support: Bilateral upper extremity supported;Feet supported Sitting balance-Leahy Scale: Good     Standing balance support: Bilateral upper extremity supported;During functional activity Standing balance-Leahy Scale: Poor                             ADL either performed or assessed with clinical judgement   ADL Overall ADL's : Needs assistance/impaired     Grooming: Wash/dry face;Sitting;Set up;Wash/dry hands                   Toilet Transfer: Stand-pivot;Cueing for safety;Moderate assistance;Cueing for sequencing   Toileting- Clothing Manipulation and Hygiene: Moderate assistance;Sit  to/from stand;Cueing for sequencing;Cueing for safety         General ADL Comments: pt has new foley in which she was very anxious about.  No dizziness reported     Vision Patient Visual Report: No change from baseline            Cognition Arousal/Alertness: Awake/alert Behavior During Therapy: WFL for tasks assessed/performed Overall Cognitive Status: Within Functional Limits for tasks assessed                                                     Pertinent Vitals/ Pain       Pain Assessment: No/denies pain         Frequency  Min 2X/week        Progress Toward Goals  OT Goals(current goals can now be found in the care plan section)  Progress towards OT goals: Progressing toward goals  Acute Rehab OT Goals Patient Stated Goal: return home  Plan Discharge plan remains appropriate    Co-evaluation                 AM-PAC OT "6 Clicks" Daily Activity     Outcome Measure   Help from another person eating meals?: A Little Help from another person taking care of  personal grooming?: A Little Help from another person toileting, which includes using toliet, bedpan, or urinal?: A Little Help from another person bathing (including washing, rinsing, drying)?: A Little Help from another person to put on and taking off regular upper body clothing?: A Little Help from another person to put on and taking off regular lower body clothing?: A Little 6 Click Score: 18    End of Session Equipment Utilized During Treatment: Rolling walker;Gait belt  OT Visit Diagnosis: Unsteadiness on feet (R26.81);Other abnormalities of gait and mobility (R26.89);Muscle weakness (generalized) (M62.81)   Activity Tolerance Patient tolerated treatment well   Patient Left in chair;with call bell/phone within reach;with chair alarm set   Nurse Communication Mobility status        Time: [id number] OT Time Calculation (min): 17 min  Charges: OT General  Charges $OT Visit: 1 Visit OT Treatments $Self Care/Home Management : 8-22 mins  Kari Baars, [place] [phone] Office- [place] D 08/04/2019, 3:45 PM

## 2019-08-04 NOTE — Progress Notes (Signed)
PROGRESS NOTE    James Gates  [id number] DOB: 1951-05-12 DOA: 08/01/2019 PCP: Bartholome Bill, MD    Brief Narrative:  68 year old gentleman with prior h/o pre diabetes, GERD, BPh, presents with nausea, decreased oral intake, generalized weakness. He was found to have UTI, . He was started on IV antibiotics. CT abd and pelvis does not show any acute findings.   Subjective:  Patient had an episode of urinary retention this morning, unable to to do in and out, required coud catheter Assessment & Plan:   Active Problems:   Lactic acidosis  Sepsis secondary to UTI -Sepsis present on admission, elevated lactic acid, leukocytosis, tachycardic and tachypneic -Secondary to UTI, Staph epidermidis, for now continue with Rocephin, follow closely as if MSSA will need to transition to vancomycin. -Blood cultures with no growth to date, continue to follow closely -Physiology of sepsis is improving  urinary retention/BPH -Patient with history of BPH, on Flomax, he had urinary retention with bladder scan showing 1000 cc of retention, unable to do in and out, required cord insertion. -Very likely his urinary retention contributing to his UTI and sepsis  Ambulatory dysfunction -Shunt with significant weakness, poor balance, likely vertigo as well, he was seen by PT/OT, recommendation for SNF. -MRI brain with no acute findings  GERD Stable.   DVT prophylaxis: lovenox.  Code Status: full code.  Family Communication: Discussed with patient Disposition Plan: Will need SNF placement   Consultants:   None.    Procedures: none.   Antimicrobials: rocephin for UTI.     Objective: Vitals:   08/04/19 1253 08/04/19 1256 08/04/19 1258 08/04/19 1259  BP: 139/85 (!) 136/92 (!) 145/90 136/86  Pulse: 75 85 92 93  Resp: 20     Temp:      TempSrc:      SpO2: 95% 95% 95% 96%  Weight:      Height:        Intake/Output Summary (Last 24 hours) at 08/04/2019 1543 Last data filed  at 08/04/2019 1435 Gross per 24 hour  Intake 0 ml  Output 2550 ml  Net -2550 ml   Filed Weights   08/01/19 0837 08/01/19 0841 08/01/19 2024  Weight: 96.2 kg 96.2 kg 102.3 kg    Examination:  Awake Alert, Oriented X 3, No new F.N deficits, Normal affect Symmetrical Chest wall movement, Good air movement bilaterally, CTAB RRR,No Gallops,Rubs or new Murmurs, No Parasternal Heave +ve B.Sounds, Abd Soft, No tenderness, No rebound - guarding or rigidity. No Cyanosis, Clubbing or edema, No new Rash or bruise      Data Reviewed: I have personally reviewed following labs and imaging studies  CBC: Recent Labs  Lab 08/01/19 0857 08/02/19 0511 08/03/19 0850  WBC 12.8* 8.0 6.9  NEUTROABS 10.8*  --  4.8  HGB 15.7 14.0 15.1  HCT 49.2 44.4 47.0  MCV 92.8 93.1 91.3  PLT 154 138* 123456*   Basic Metabolic Panel: Recent Labs  Lab 08/01/19 0857 08/02/19 0511 08/03/19 0850  NA 142 143 141  K 3.3* 3.9 3.7  CL 107 111 107  CO2 21* 23 22  GLUCOSE 210* 105* 110*  BUN 19 13 10   CREATININE 1.27* 1.15 1.09  CALCIUM 9.1 8.8* 9.2   GFR: Estimated Creatinine Clearance: 75.4 mL/min (by C-G formula based on SCr of 1.09 mg/dL). Liver Function Tests: Recent Labs  Lab 08/01/19 0857 08/02/19 0511  AST 37 38  ALT 39 31  ALKPHOS 70 60  BILITOT 1.5* 1.5*  PROT 7.5 6.0*  ALBUMIN 4.5 3.4*   No results for input(s): LIPASE, AMYLASE in the last 168 hours. No results for input(s): AMMONIA in the last 168 hours. Coagulation Profile: No results for input(s): INR, PROTIME in the last 168 hours. Cardiac Enzymes: No results for input(s): CKTOTAL, CKMB, CKMBINDEX, TROPONINI in the last 168 hours. BNP (last 3 results) No results for input(s): PROBNP in the last 8760 hours. HbA1C: Recent Labs    08/02/19 0511  HGBA1C 6.1*   CBG: Recent Labs  Lab 08/03/19 0727 08/03/19 1126 08/03/19 2101 08/04/19 0740 08/04/19 1213  GLUCAP 106* 126* 127* 111* 109*   Lipid Profile: Recent Labs     08/02/19 0511  TRIG 103   Thyroid Function Tests: No results for input(s): TSH, T4TOTAL, FREET4, T3FREE, THYROIDAB in the last 72 hours. Anemia Panel: No results for input(s): VITAMINB12, FOLATE, FERRITIN, TIBC, IRON, RETICCTPCT in the last 72 hours. Sepsis Labs: Recent Labs  Lab 08/01/19 0857 08/01/19 1428 08/01/19 1628 08/02/19 0511  PROCALCITON <0.10  --   --   --   LATICACIDVEN 7.5* 4.3* 2.8* 1.0    Recent Results (from the past 240 hour(s))  Blood Culture (routine x 2)     Status: None (Preliminary result)   Collection Time: 08/01/19  8:57 AM   Specimen: BLOOD  Result Value Ref Range Status   Specimen Description   Final    BLOOD RIGHT ANTECUBITAL Performed at [facility], [place] [address] [place], [place] [address]    Special Requests   Final    BOTTLES DRAWN AEROBIC AND ANAEROBIC Blood Culture adequate volume Performed at [place] [address], [place], [place] [address]    Culture   Final    NO GROWTH 3 DAYS Performed at [facility], [place] [address] [place], [place] [address]    Report Status PENDING  Incomplete  Blood Culture (routine x 2)     Status: None (Preliminary result)   Collection Time: 08/01/19  9:02 AM   Specimen: BLOOD RIGHT FOREARM  Result Value Ref Range Status   Specimen Description   Final    BLOOD RIGHT FOREARM Performed at [facility], [place] [address], [place], [place] [address]    Special Requests   Final    BOTTLES DRAWN AEROBIC AND ANAEROBIC Blood Culture adequate volume Performed at [place] [address], [place], [place] [address]    Culture   Final    NO GROWTH 3 DAYS Performed at [facility], [place] [address] [place], [place] [address]    Report Status PENDING  Incomplete  SARS CORONAVIRUS 2 (TAT 6-24 HRS) Nasopharyngeal Nasopharyngeal Swab     Status: None   Collection Time: 08/01/19 11:34 AM   Specimen: Nasopharyngeal Swab  Result Value  Ref Range Status   SARS Coronavirus 2 NEGATIVE NEGATIVE Final    Comment: (NOTE) SARS-CoV-2 target nucleic acids are NOT DETECTED. The SARS-CoV-2 RNA is generally detectable in upper and lower respiratory specimens during the acute phase of infection. Negative results do not preclude SARS-CoV-2 infection, do not rule out co-infections with other pathogens, and should not be used as the sole basis for treatment or other patient management decisions. Negative results must be combined with clinical observations, patient history, and epidemiological information. The expected result is Negative. Fact Sheet for Patients: SugarRoll.be Fact Sheet for Healthcare Providers: https://www.woods-mathews.com/ This test is not yet approved or cleared by the [place] FDA  and  has been authorized for detection and/or diagnosis of SARS-CoV-2 by FDA under an Emergency Use Authorization (EUA). This EUA will remain  in effect (meaning this test can be used) for the duration of the COVID-19 declaration under Section 56 4(b)(1) of the Act, 21 U.S.C. section 360bbb-3(b)(1), unless the authorization is terminated or revoked sooner. Performed at [facility], [place] [address], [place], [place] [address]   Urine culture     Status: Abnormal (Preliminary result)   Collection Time: 08/01/19  3:25 PM   Specimen: Urine, Clean Catch  Result Value Ref Range Status   Specimen Description   Final    URINE, CLEAN CATCH Performed at [facility], [place] [address] [place], [place] [address]    Special Requests   Final    NONE Performed at [facility], [place] [address], [place], [place] [address]    Culture (A)  Final    >=100,000 COLONIES/mL STAPHYLOCOCCUS EPIDERMIDIS SUSCEPTIBILITIES TO FOLLOW Performed at [facility], [place] [address], [place],  [address]    Report Status PENDING  Incomplete          Radiology Studies: Mr Brain Wo Contrast  Result Date: 08/03/2019 CLINICAL DATA:  Ataxia.  Acute stroke suspected.  Confusion. EXAM: MRI HEAD WITHOUT CONTRAST TECHNIQUE: Multiplanar, multiecho pulse sequences of the brain and surrounding structures were obtained without intravenous contrast. COMPARISON:  None. FINDINGS: Brain: Diffusion imaging does not show any acute or subacute infarction. Brainstem and cerebellum are normal. There are minimal small vessel changes of the hemispheric white matter, less than often seen at this age. No cortical or large vessel territory infarction. No mass lesion, hemorrhage, hydrocephalus or extra-axial collection. Vascular: Major vessels at the base of the brain show flow. Skull and upper cervical spine: Negative Sinuses/Orbits: Clear/normal.  Small right mastoid effusion. Other: None IMPRESSION: Normal appearance of the brain for age. Very minimal small vessel change of the hemispheric white matter. Small right mastoid effusion. Electronically Signed   By: Nelson Chimes M.D.   On: 08/03/2019 14:42        Scheduled Meds: . aspirin  81 mg Oral Daily  . enoxaparin (LOVENOX) injection  40 mg Subcutaneous Q24H  . insulin aspart  0-5 Units Subcutaneous QHS  . insulin aspart  0-9 Units Subcutaneous TID WC  . pantoprazole  40 mg Oral Q0600  . tamsulosin  0.8 mg Oral QHS   Continuous Infusions: . cefTRIAXone (ROCEPHIN)  IV Stopped (08/03/19 2146)     LOS: 2 days        Phillips Climes, MD [facility] 08/04/2019, 3:43 PM

## 2019-08-04 NOTE — TOC Progression Note (Signed)
Transition of Care [facility]) - Progression Note    Patient Details  Name: ESVIN GAYMON MRN: [id number] Date of Birth: 1951/06/04  Transition of Care [facility]) CM/SW Contact  Safiyyah Vasconez, Juliann Pulse, RN Phone Number: 08/04/2019, 4:06 PM  Clinical Narrative:  [facility] following for HHC-HHPT/HHOT.     Expected Discharge Plan: [place] Barriers to Discharge: Continued Medical Work up  Expected Discharge Plan and Services Expected Discharge Plan: [place]   Discharge Planning Services: CM Consult Post Acute Care Choice: [place] arrangements for the past 2 months: Single Family Home                                       Social Determinants of Health (SDOH) Interventions    Readmission Risk Interventions No flowsheet data found.

## 2019-08-04 NOTE — Plan of Care (Signed)
Continue current POC 

## 2019-08-05 LAB — GLUCOSE, CAPILLARY
Glucose-Capillary: 119 mg/dL — ABNORMAL HIGH (ref 70–99)
Glucose-Capillary: 107 mg/dL — ABNORMAL HIGH (ref 70–99)
Glucose-Capillary: 114 mg/dL — ABNORMAL HIGH (ref 70–99)
Glucose-Capillary: 104 mg/dL — ABNORMAL HIGH (ref 70–99)

## 2019-08-05 LAB — URINE CULTURE: Culture: >=100000 — AB

## 2019-08-05 LAB — PROCALCITONIN: Procalcitonin: <0.1 ng/mL

## 2019-08-05 LAB — CBC
HCT: 44.7 % (ref 39.0–52.0)
Hemoglobin: 14.8 g/dL (ref 13.0–17.0)
MCH: 29.7 pg (ref 26.0–34.0)
MCHC: 33.1 g/dL (ref 30.0–36.0)
MCV: 89.6 fL (ref 80.0–100.0)
Platelets: 137 10*3/uL — ABNORMAL LOW (ref 150–400)
RBC: 4.99 MIL/uL (ref 4.22–5.81)
RDW: 12.5 % (ref 11.5–15.5)
WBC: 7.4 10*3/uL (ref 4.0–10.5)
nRBC: 0 % (ref 0.0–0.2)

## 2019-08-05 LAB — BASIC METABOLIC PANEL
Anion gap: 9 (ref 5–15)
BUN: 12 mg/dL (ref 8–23)
CO2: 22 mmol/L (ref 22–32)
Calcium: 8.6 mg/dL — ABNORMAL LOW (ref 8.9–10.3)
Chloride: 108 mmol/L (ref 98–111)
Creatinine, Ser: 1.07 mg/dL (ref 0.61–1.24)
GFR calc Af Amer: >60 mL/min (ref >60)
GFR calc non Af Amer: >60 mL/min (ref >60)
Glucose, Bld: 102 mg/dL — ABNORMAL HIGH (ref 70–99)
Potassium: 3.6 mmol/L (ref 3.5–5.1)
Sodium: 139 mmol/L (ref 135–145)

## 2019-08-05 MED ORDER — VANCOMYCIN HCL 10 G IV SOLR
2000.0000 mg | Freq: Once | INTRAVENOUS | Status: AC
  Administered 2019-08-05: 2000 mg via INTRAVENOUS
  Filled 2019-08-05: qty 2000

## 2019-08-05 MED ORDER — VANCOMYCIN HCL 10 G IV SOLR
1500.0000 mg | INTRAVENOUS | Status: AC
  Administered 2019-08-06 – 2019-08-07 (×2): 1500 mg via INTRAVENOUS
  Filled 2019-08-05 (×2): qty 1500

## 2019-08-05 NOTE — Progress Notes (Signed)
Physical Therapy Treatment Patient Details Name: James Gates MRN: [id number] DOB: 1951-02-13 Today's Date: 08/05/2019    History of Present Illness Pt is  68 y.o. male with medical history significant of BPH, GERD, prediabetes, back surgery, L THA, and sleep apnes. Pt with c/o generalized weakness and admitted with UTI .  MRI brain: "Normal appearance of the brain for age. Very minimal small vesselchange of the hemispheric white matter. Small right mastoid effusion."    PT Comments    Pt continues to have right beating horizontal nystagmus at rest (very minimal).  Pt reports improved dizziness and "wooziness" today.  Pt reports only episode of dizziness and nausea with eating, denies any symptoms with movement today (states he has been in bathroom and up in recliner on arrival).  Pt also able to tolerate improved ambulation distance with RW.  Pt feels more confident with plan for d/c home.    Follow Up Recommendations  Home health PT;Supervision for mobility/OOB     Equipment Recommendations  Rolling walker with 5" wheels    Recommendations for Other Services       Precautions / Restrictions Precautions Precautions: Fall    Mobility  Bed Mobility               General bed mobility comments: pt in recliner  Transfers Overall transfer level: Needs assistance Equipment used: Rolling walker (2 wheeled) Transfers: Sit to/from Stand Sit to Stand: Min guard;Supervision         General transfer comment: verbal cues for hand placement; min/guard for safety  Ambulation/Gait Ambulation/Gait assistance: Min guard;Supervision Gait Distance (Feet): 300 Feet Assistive device: Rolling walker (2 wheeled) Gait Pattern/deviations: Step-through pattern;Decreased stride length;Trunk flexed Gait velocity: improved speed today (pt reports closer to his baseline pace)   General Gait Details: verbal cues for safe use of RW, cues for focusing on stationary objects and turning slowly  as a unit; pt reports 2/10 dizziness today however states better then yesterday; also improved with focusing on stationary object (did not recall from yesterday)   Stairs             Wheelchair Mobility    Modified Rankin (Stroke Patients Only)       Balance                                            Cognition Arousal/Alertness: Awake/alert Behavior During Therapy: WFL for tasks assessed/performed Overall Cognitive Status: Within Functional Limits for tasks assessed                                        Exercises      General Comments        Pertinent Vitals/Pain Pain Assessment: No/denies pain    Home Living                      Prior Function            PT Goals (current goals can now be found in the care plan section) Progress towards PT goals: Progressing toward goals    Frequency    Min 3X/week      PT Plan Current plan remains appropriate    Co-evaluation  AM-PAC PT "6 Clicks" Mobility   Outcome Measure  Help needed turning from your back to your side while in a flat bed without using bedrails?: A Little Help needed moving from lying on your back to sitting on the side of a flat bed without using bedrails?: A Little Help needed moving to and from a bed to a chair (including a wheelchair)?: A Little Help needed standing up from a chair using your arms (Gates.g., wheelchair or bedside chair)?: A Little Help needed to walk in hospital room?: A Little Help needed climbing 3-5 steps with a railing? : A Little 6 Click Score: 18    End of Session   Activity Tolerance: Patient tolerated treatment well Patient left: in chair;with call bell/phone within reach;with chair alarm set   PT Visit Diagnosis: Unsteadiness on feet (R26.81);Other abnormalities of gait and mobility (R26.89)     Time: [id number] PT Time Calculation (min) (ACUTE ONLY): 15 min  Charges:  $Gait Training: 8-22  mins                     Arlyce Dice, DPT Acute Rehabilitation Services Office: [phone]  James Gates,James Gates 08/05/2019, 4:01 PM

## 2019-08-05 NOTE — Progress Notes (Signed)
Pharmacy Antibiotic Note  James Gates is a 68 y.o. male presented to the ED on 08/01/2019 with c/o generalized weakness, n/v/d and cough.  He was started on ceftriaxone on admission for suspected UTI.  UTI on 12/6 resulted back on 12/10 with Staph epi (resistant to oxacillin).  Pharmacy has been consulted to change abx to vancomycin on 12/10 for UTI.  Plan: - vancomycin 2000 mg IV x1, then 1500 mg q24h for est AUC 503  ______________________________________________  Height: 5\' 9"  (175.3 cm) Weight: 225 lb 8.5 oz (102.3 kg) IBW/kg (Calculated) : 70.7  Temp (24hrs), Avg:98.3 F (36.8 C), Min:98.2 F (36.8 C), Max:98.3 F (36.8 C)  Recent Labs  Lab 08/01/19 0857 08/01/19 1428 08/01/19 1628 08/02/19 0511 08/03/19 0850 08/05/19 0541  WBC 12.8*  --   --  8.0 6.9 7.4  CREATININE 1.27*  --   --  1.15 1.09 1.07  LATICACIDVEN 7.5* 4.3* 2.8* 1.0  --   --     Estimated Creatinine Clearance: 76.8 mL/min (by C-G formula based on SCr of 1.07 mg/dL).    No Known Allergies  Antimicrobials this admission:  12/6 zosyn x1 12/6 CTX>>12/10 12/10 vanc>>  Microbiology results:  12/6 UA: few bact, large leuk 12/6 BCx x2:  12/6 UCx: >100K staph epi (resistant to oxacillin) FINAL  Thank you for allowing pharmacy to be a part of this patient's care.  Lynelle Doctor 08/05/2019 8:18 AM

## 2019-08-05 NOTE — TOC Progression Note (Signed)
Transition of Care [facility]) - Progression Note    Patient Details  Name: James Gates MRN: [id number] Date of Birth: 05-Jan-1951  Transition of Care [facility]) CM/SW Contact  Yolonda Purtle, Juliann Pulse, RN Phone Number: 08/05/2019, 2:13 PM  Clinical Narrative: [employer]-[facility] delivered. HHPT/OT-[facility] rep Ronalee Belts.      Expected Discharge Plan: [place] Barriers to Discharge: Continued Medical Work up  Expected Discharge Plan and Services Expected Discharge Plan: [place]   Discharge Planning Services: CM Consult Post Acute Care Choice: [place] arrangements for the past 2 months: Single Family Home                           HH Arranged: PT, OT [place] Agency: [facility] [employer] Date [place]: 08/05/19 Time [place]: [id number] Representative spoke with at [place]: [place] ([place]) Interventions    Readmission Risk Interventions No flowsheet data found.

## 2019-08-05 NOTE — Care Management Important Message (Signed)
Important Message  Patient Details IM Letter given to Dessa Phi RN Case Manager to present to the Patient Name: James Gates MRN: [id number] Date of Birth: [birth year]   Medicare Important Message Given:  Yes     Kerin Salen 08/05/2019, 3:31 PM

## 2019-08-05 NOTE — Discharge Instructions (Signed)
Indwelling Urinary Catheter Care, Adult °An indwelling urinary catheter is a thin tube that is put into your bladder. The tube helps to drain pee (urine) out of your body. The tube goes in through your urethra. Your urethra is where pee comes out of your body. Your pee will come out through the catheter, then it will go into a bag (drainage bag). °Take good care of your catheter so it will work well. °How to wear your catheter and bag °Supplies needed °· Sticky tape (adhesive tape) or a leg strap. °· Alcohol wipe or soap and water (if you use tape). °· A clean towel (if you use tape). °· Large overnight bag. °· Smaller bag (leg bag). °Wearing your catheter °Attach your catheter to your leg with tape or a leg strap. °· Make sure the catheter is not pulled tight. °· If a leg strap gets wet, take it off and put on a dry strap. °· If you use tape to hold the bag on your leg: °1. Use an alcohol wipe or soap and water to wash your skin where the tape made it sticky before. °2. Use a clean towel to pat-dry that skin. °3. Use new tape to make the bag stay on your leg. °Wearing your bags °You should have been given a large overnight bag. °· You may wear the overnight bag in the day or night. °· Always have the overnight bag lower than your bladder.  Do not let the bag touch the floor. °· Before you go to sleep, put a clean plastic bag in a wastebasket. Then hang the overnight bag inside the wastebasket. °You should also have a smaller leg bag that fits under your clothes. °· Always wear the leg bag below your knee. °· Do not wear your leg bag at night. °How to care for your skin and catheter °Supplies needed °· A clean washcloth. °· Water and mild soap. °· A clean towel. °Caring for your skin and catheter ° °  ° °· Clean the skin around your catheter every day: °? Wash your hands with soap and water. °? Wet a clean washcloth in warm water and mild soap. °? Clean the skin around your urethra. °? If you are male: °? Gently  spread the folds of skin around your vagina (labia). °? With the washcloth in your other hand, wipe the inner side of your labia on each side. Wipe from front to back. °? If you are male: °? Pull back any skin that covers the end of your penis (foreskin). °? With the washcloth in your other hand, wipe your penis in small circles. Start wiping at the tip of your penis, then move away from the catheter. °? Move the foreskin back in place, if needed. °? With your free hand, hold the catheter close to where it goes into your body. °? Keep holding the catheter during cleaning so it does not get pulled out. °? With the washcloth in your other hand, clean the catheter. °? Only wipe downward on the catheter. °? Do not wipe upward toward your body. Doing this may push germs into your urethra and cause infection. °? Use a clean towel to pat-dry the catheter and the skin around it. Make sure to wipe off all soap. °? Wash your hands with soap and water. °· Shower every day. Do not take baths. °· Do not use cream, ointment, or lotion on the area where the catheter goes into your body, unless your doctor tells you   to. °· Do not use powders, sprays, or lotions on your genital area. °· Check your skin around the catheter every day for signs of infection. Check for: °? Redness, swelling, or pain. °? Fluid or blood. °? Warmth. °? Pus or a bad smell. °How to empty the bag °Supplies needed °· Rubbing alcohol. °· Gauze pad or cotton ball. °· Tape or a leg strap. °Emptying the bag °Pour the pee out of your bag when it is ?-½ full, or at least 2-3 times a day. Do this for your overnight bag and your leg bag. °1. Wash your hands with soap and water. °2. Separate (detach) the bag from your leg. °3. Hold the bag over the toilet or a clean pail. Keep the bag lower than your hips and bladder. This is so the pee (urine) does not go back into the tube. °4. Open the pour spout. It is at the bottom of the bag. °5. Empty the pee into the toilet or  pail. Do not let the pour spout touch any surface. °6. Put rubbing alcohol on a gauze pad or cotton ball. °7. Use the gauze pad or cotton ball to clean the pour spout. °8. Close the pour spout. °9. Attach the bag to your leg with tape or a leg strap. °10. Wash your hands with soap and water. °Follow instructions for cleaning the drainage bag: °· From the product maker. °· As told by your doctor. °How to change the bag °Supplies needed °· Alcohol wipes. °· A clean bag. °· Tape or a leg strap. °Changing the bag °Replace your bag when it starts to leak, smell bad, or look dirty. °1. Wash your hands with soap and water. °2. Separate the dirty bag from your leg. °3. Pinch the catheter with your fingers so that pee does not spill out. °4. Separate the catheter tube from the bag tube where these tubes connect (at the connection valve). Do not let the tubes touch any surface. °5. Clean the end of the catheter tube with an alcohol wipe. Use a different alcohol wipe to clean the end of the bag tube. °6. Connect the catheter tube to the tube of the clean bag. °7. Attach the clean bag to your leg with tape or a leg strap. Do not make the bag tight on your leg. °8. Wash your hands with soap and water. °General rules ° °· Never pull on your catheter. Never try to take it out. Doing that can hurt you. °· Always wash your hands before and after you touch your catheter or bag. Use a mild, fragrance-free soap. If you do not have soap and water, use hand sanitizer. °· Always make sure there are no twists or bends (kinks) in the catheter tube. °· Always make sure there are no leaks in the catheter or bag. °· Drink enough fluid to keep your pee pale yellow. °· Do not take baths, swim, or use a hot tub. °· If you are male, wipe from front to back after you poop (have a bowel movement). °Contact a doctor if: °· Your pee is cloudy. °· Your pee smells worse than usual. °· Your catheter gets clogged. °· Your catheter leaks. °· Your bladder  feels full. °Get help right away if: °· You have redness, swelling, or pain where the catheter goes into your body. °· You have fluid, blood, pus, or a bad smell coming from the area where the catheter goes into your body. °· Your skin feels warm where   the catheter goes into your body. °· You have a fever. °· You have pain in your: °? Belly (abdomen). °? Legs. °? Lower back. °? Bladder. °· You see blood in the catheter. °· Your pee is pink or red. °· You feel sick to your stomach (nauseous). °· You throw up (vomit). °· You have chills. °· Your pee is not draining into the bag. °· Your catheter gets pulled out. °Summary °· An indwelling urinary catheter is a thin tube that is placed into the bladder to help drain pee (urine) out of the body. °· The catheter is placed into the part of the body that drains pee from the bladder (urethra). °· Taking good care of your catheter will keep it working properly and help prevent problems. °· Always wash your hands before and after touching your catheter or bag. °· Never pull on your catheter or try to take it out. °This information is not intended to replace advice given to you by your health care provider. Make sure you discuss any questions you have with your health care provider. °Document Released: 12/07/2012 Document Revised: 12/04/2018 Document Reviewed: 03/28/2017 °Elsevier Patient Education © 2020 [employer] ° °

## 2019-08-05 NOTE — Progress Notes (Signed)
PROGRESS NOTE    James Gates  [id number] DOB: 09-Jun-1951 DOA: 08/01/2019 PCP: Bartholome Bill, MD    Brief Narrative:  68 year old gentleman with prior h/o pre diabetes, GERD, BPh, presents with nausea, decreased oral intake, generalized weakness. He was found to have UTI, . He was started on IV antibiotics. CT abd and pelvis does not show any acute findings.   Subjective:  Patient reports he still feeling very weak, he had urinary retention where he required coud cath insertion with 2.1 L immediate output.  Assessment & Plan:   Active Problems:   Lactic acidosis  Sepsis secondary to UTI -Sepsis present on admission, elevated lactic acid, leukocytosis, tachycardic and tachypneic -Secondary to UTI, Staph epidermidis, activity appears to show oxacillin resistant, will change Rocephin to vancomycin currently given he still weak, sick appearing, will need IV Vanco for 1 to 2 days then hopefully can transition to oral regimen. -Blood cultures with no growth to date, continue to follow closely -Physiology of sepsis is improving  urinary retention/BPH -Patient with history of BPH, on Flomax 0.8 mg p.o. nightly, had urinary retention required coud insertion 12/9 with 2.1 L output, will consult urology for further recommendation. -Very likely his urinary retention contributing to his UTI and sepsis  Ambulatory dysfunction -Patient with significant weakness, poor balance, likely vertigo as well, he was seen by PT/OT, recommendation for SNF. -MRI brain with no acute findings  GERD Stable.   DVT prophylaxis: lovenox.  Code Status: full code.  Family Communication: Discussed with patient, will update wife today Disposition Plan: Will need SNF placement   Consultants:   None.    Procedures: none.   Antimicrobials: rocephin for UTI.     Objective: Vitals:   08/04/19 1259 08/04/19 2011 08/05/19 0800 08/05/19 1351  BP: 136/86 (!) 144/84 (!) 151/80 (!) 144/88   Pulse: 93 81 82 79  Resp:  18  18  Temp:  98.2 F (36.8 C) 98.3 F (36.8 C) 98 F (36.7 C)  TempSrc:  Oral Oral Oral  SpO2: 96% 97%  96%  Weight:      Height:        Intake/Output Summary (Last 24 hours) at 08/05/2019 1424 Last data filed at 08/05/2019 1052 Gross per 24 hour  Intake 480 ml  Output 3750 ml  Net -3270 ml   Filed Weights   08/01/19 0837 08/01/19 0841 08/01/19 2024  Weight: 96.2 kg 96.2 kg 102.3 kg    Examination:  Awake Alert, Oriented X 3, No new F.N deficits, Normal affect Symmetrical Chest wall movement, Good air movement bilaterally, CTAB RRR,No Gallops,Rubs or new Murmurs, No Parasternal Heave +ve B.Sounds, Abd Soft, No tenderness, No rebound - guarding or rigidity. No Cyanosis, Clubbing or edema, No new Rash or bruise      Data Reviewed: I have personally reviewed following labs and imaging studies  CBC: Recent Labs  Lab 08/01/19 0857 08/02/19 0511 08/03/19 0850 08/05/19 0541  WBC 12.8* 8.0 6.9 7.4  NEUTROABS 10.8*  --  4.8  --   HGB 15.7 14.0 15.1 14.8  HCT 49.2 44.4 47.0 44.7  MCV 92.8 93.1 91.3 89.6  PLT 154 138* 144* 0000000*   Basic Metabolic Panel: Recent Labs  Lab 08/01/19 0857 08/02/19 0511 08/03/19 0850 08/05/19 0541  NA 142 143 141 139  K 3.3* 3.9 3.7 3.6  CL 107 111 107 108  CO2 21* 23 22 22   GLUCOSE 210* 105* 110* 102*  BUN 19 13 10  12  CREATININE 1.27* 1.15 1.09 1.07  CALCIUM 9.1 8.8* 9.2 8.6*   GFR: Estimated Creatinine Clearance: 76.8 mL/min (by C-G formula based on SCr of 1.07 mg/dL). Liver Function Tests: Recent Labs  Lab 08/01/19 0857 08/02/19 0511  AST 37 38  ALT 39 31  ALKPHOS 70 60  BILITOT 1.5* 1.5*  PROT 7.5 6.0*  ALBUMIN 4.5 3.4*   No results for input(s): LIPASE, AMYLASE in the last 168 hours. No results for input(s): AMMONIA in the last 168 hours. Coagulation Profile: No results for input(s): INR, PROTIME in the last 168 hours. Cardiac Enzymes: No results for input(s): CKTOTAL, CKMB,  CKMBINDEX, TROPONINI in the last 168 hours. BNP (last 3 results) No results for input(s): PROBNP in the last 8760 hours. HbA1C: No results for input(s): HGBA1C in the last 72 hours. CBG: Recent Labs  Lab 08/04/19 1213 08/04/19 1615 08/04/19 2058 08/05/19 0759 08/05/19 1144  GLUCAP 109* 125* 105* 119* 107*   Lipid Profile: No results for input(s): CHOL, HDL, LDLCALC, TRIG, CHOLHDL, LDLDIRECT in the last 72 hours. Thyroid Function Tests: No results for input(s): TSH, T4TOTAL, FREET4, T3FREE, THYROIDAB in the last 72 hours. Anemia Panel: No results for input(s): VITAMINB12, FOLATE, FERRITIN, TIBC, IRON, RETICCTPCT in the last 72 hours. Sepsis Labs: Recent Labs  Lab 08/01/19 0857 08/01/19 1428 08/01/19 1628 08/02/19 0511 08/05/19 0541  PROCALCITON <0.10  --   --   --  <0.10  LATICACIDVEN 7.5* 4.3* 2.8* 1.0  --     Recent Results (from the past 240 hour(s))  Blood Culture (routine x 2)     Status: None (Preliminary result)   Collection Time: 08/01/19  8:57 AM   Specimen: BLOOD  Result Value Ref Range Status   Specimen Description   Final    BLOOD RIGHT ANTECUBITAL Performed at [facility], [place] [address], [place], [place] [address]    Special Requests   Final    BOTTLES DRAWN AEROBIC AND ANAEROBIC Blood Culture adequate volume Performed at [place] [address] [place], [place] [address]    Culture   Final    NO GROWTH 4 DAYS Performed at [facility], [place] [address] [place], [place] [address]    Report Status PENDING  Incomplete  Blood Culture (routine x 2)     Status: None (Preliminary result)   Collection Time: 08/01/19  9:02 AM   Specimen: BLOOD RIGHT FOREARM  Result Value Ref Range Status   Specimen Description   Final    BLOOD RIGHT FOREARM Performed at [facility], [place] [address] [place], [place] [address]    Special Requests   Final    BOTTLES DRAWN AEROBIC AND ANAEROBIC Blood Culture adequate  volume Performed at [place] [address], [place], [place] [address]    Culture   Final    NO GROWTH 4 DAYS Performed at [facility], [place] [address], [place],  [address]    Report Status PENDING  Incomplete  SARS CORONAVIRUS 2 (TAT 6-24 HRS) Nasopharyngeal Nasopharyngeal Swab     Status: None   Collection Time: 08/01/19 11:34 AM   Specimen: Nasopharyngeal Swab  Result Value Ref Range Status   SARS Coronavirus 2 NEGATIVE NEGATIVE Final    Comment: (NOTE) SARS-CoV-2 target nucleic acids are NOT DETECTED. The SARS-CoV-2 RNA is generally detectable in upper and lower respiratory specimens during the acute phase of infection. Negative results do not preclude SARS-CoV-2 infection, do not rule  out co-infections with other pathogens, and should not be used as the sole basis for treatment or other patient management decisions. Negative results must be combined with clinical observations, patient history, and epidemiological information. The expected result is Negative. Fact Sheet for Patients: SugarRoll.be Fact Sheet for Healthcare Providers: https://www.woods-mathews.com/ This test is not yet approved or cleared by the [place] FDA and  has been authorized for detection and/or diagnosis of SARS-CoV-2 by FDA under an Emergency Use Authorization (EUA). This EUA will remain  in effect (meaning this test can be used) for the duration of the COVID-19 declaration under Section 56 4(b)(1) of the Act, 21 U.S.C. section 360bbb-3(b)(1), unless the authorization is terminated or revoked sooner. Performed at [facility], [place] [address] [place], [place] [address]   Urine culture     Status: Abnormal   Collection Time: 08/01/19  3:25 PM   Specimen: Urine, Clean Catch  Result Value Ref Range Status   Specimen Description   Final    URINE, CLEAN CATCH Performed at [facility], [place]  [address], [place], [place] [address]    Special Requests   Final    NONE Performed at [facility], [place] [address], [place], [place] [address]    Culture >=100,000 COLONIES/mL STAPHYLOCOCCUS EPIDERMIDIS (A)  Final   Report Status 08/05/2019 FINAL  Final   Organism ID, Bacteria STAPHYLOCOCCUS EPIDERMIDIS (A)  Final      Susceptibility   Staphylococcus epidermidis - MIC*    CIPROFLOXACIN <=0.5 SENSITIVE Sensitive     GENTAMICIN <=0.5 SENSITIVE Sensitive     NITROFURANTOIN <=16 SENSITIVE Sensitive     OXACILLIN 0.5 RESISTANT Resistant     TETRACYCLINE 2 SENSITIVE Sensitive     VANCOMYCIN <=0.5 SENSITIVE Sensitive     TRIMETH/SULFA <=10 SENSITIVE Sensitive     CLINDAMYCIN <=0.25 SENSITIVE Sensitive     RIFAMPIN <=0.5 SENSITIVE Sensitive     Inducible Clindamycin NEGATIVE Sensitive     * >=100,000 COLONIES/mL STAPHYLOCOCCUS EPIDERMIDIS         Radiology Studies: MR BRAIN WO CONTRAST  Result Date: 08/03/2019 CLINICAL DATA:  Ataxia.  Acute stroke suspected.  Confusion. EXAM: MRI HEAD WITHOUT CONTRAST TECHNIQUE: Multiplanar, multiecho pulse sequences of the brain and surrounding structures were obtained without intravenous contrast. COMPARISON:  None. FINDINGS: Brain: Diffusion imaging does not show any acute or subacute infarction. Brainstem and cerebellum are normal. There are minimal small vessel changes of the hemispheric white matter, less than often seen at this age. No cortical or large vessel territory infarction. No mass lesion, hemorrhage, hydrocephalus or extra-axial collection. Vascular: Major vessels at the base of the brain show flow. Skull and upper cervical spine: Negative Sinuses/Orbits: Clear/normal.  Small right mastoid effusion. Other: None IMPRESSION: Normal appearance of the brain for age. Very minimal small vessel change of the hemispheric white matter. Small right mastoid effusion. Electronically Signed   By: Nelson Chimes M.D.   On: 08/03/2019 14:42         Scheduled Meds: . aspirin  81 mg Oral Daily  . Chlorhexidine Gluconate Cloth  6 each Topical Daily  . enoxaparin (LOVENOX) injection  40 mg Subcutaneous Q24H  . insulin aspart  0-5 Units Subcutaneous QHS  . insulin aspart  0-9 Units Subcutaneous TID WC  . pantoprazole  40 mg Oral Q0600  . tamsulosin  0.8 mg Oral QHS   Continuous Infusions: . [START ON 08/06/2019] vancomycin       LOS: 3 days  Phillips Climes, MD [facility] 08/05/2019, 2:24 PM

## 2019-08-06 LAB — CBC
HCT: 45.8 % (ref 39.0–52.0)
Hemoglobin: 15 g/dL (ref 13.0–17.0)
MCH: 29.4 pg (ref 26.0–34.0)
MCHC: 32.8 g/dL (ref 30.0–36.0)
MCV: 89.6 fL (ref 80.0–100.0)
Platelets: 142 10*3/uL — ABNORMAL LOW (ref 150–400)
RBC: 5.11 MIL/uL (ref 4.22–5.81)
RDW: 12.4 % (ref 11.5–15.5)
WBC: 6.4 10*3/uL (ref 4.0–10.5)
nRBC: 0 % (ref 0.0–0.2)

## 2019-08-06 LAB — BASIC METABOLIC PANEL
Anion gap: 10 (ref 5–15)
BUN: 11 mg/dL (ref 8–23)
CO2: 23 mmol/L (ref 22–32)
Calcium: 8.8 mg/dL — ABNORMAL LOW (ref 8.9–10.3)
Chloride: 106 mmol/L (ref 98–111)
Creatinine, Ser: 1.06 mg/dL (ref 0.61–1.24)
GFR calc Af Amer: >60 mL/min (ref >60)
GFR calc non Af Amer: >60 mL/min (ref >60)
Glucose, Bld: 104 mg/dL — ABNORMAL HIGH (ref 70–99)
Potassium: 3.5 mmol/L (ref 3.5–5.1)
Sodium: 139 mmol/L (ref 135–145)

## 2019-08-06 LAB — GLUCOSE, CAPILLARY
Glucose-Capillary: 117 mg/dL — ABNORMAL HIGH (ref 70–99)
Glucose-Capillary: 134 mg/dL — ABNORMAL HIGH (ref 70–99)
Glucose-Capillary: 104 mg/dL — ABNORMAL HIGH (ref 70–99)
Glucose-Capillary: 117 mg/dL — ABNORMAL HIGH (ref 70–99)

## 2019-08-06 LAB — CULTURE, BLOOD (ROUTINE X 2)
Culture: NO GROWTH
Culture: NO GROWTH
Special Requests: ADEQUATE
Special Requests: ADEQUATE

## 2019-08-06 LAB — SARS CORONAVIRUS 2 (TAT 6-24 HRS): SARS Coronavirus 2: NEGATIVE

## 2019-08-06 MED ORDER — POTASSIUM CHLORIDE CRYS ER 20 MEQ PO TBCR
40.0000 meq | EXTENDED_RELEASE_TABLET | Freq: Once | ORAL | Status: AC
  Administered 2019-08-06: 21:00:00 40 meq via ORAL
  Filled 2019-08-06: qty 2

## 2019-08-06 NOTE — Progress Notes (Signed)
PROGRESS NOTE    James Gates  [id number] DOB: 1950/09/14 DOA: 08/01/2019 PCP: Bartholome Bill, MD    Brief Narrative:  68 year old gentleman with prior h/o pre diabetes, GERD, BPh, presents with nausea, decreased oral intake, generalized weakness. He was found to have UTI, . He was started on IV antibiotics. CT abd and pelvis does not show any acute findings.   Subjective:  Patient reports he is feeling better today, less dizziness, he feels less wobbly when he ambulated today .   Assessment & Plan:   Active Problems:   Lactic acidosis  Sepsis secondary to UTI -Sepsis present on admission, elevated lactic acid, leukocytosis, tachycardic and tachypneic -Secondary to UTI, Staph epidermidis, sensitivity appears to show oxacillin resistant, change Rocephin to vancomycin, appears to be improving, hopefully can be discharged tomorrow on oral Bactrim for total of 10 to 14 days treatment of antibiotics giving it is complicated UTI. -Blood cultures with no growth to date, continue to follow closely -Physiology of sepsis is improving  urinary retention/BPH -Patient with history of BPH, on Flomax 0.8 mg p.o. nightly, had urinary retention required coud insertion 12/9 with 2.1 L output, discussed with urology Dr. Junious Silk, covering for Dr. Loel Lofty, patient will be discharged with Foley catheter, appointment has been already arranged for next week for voiding trials at Dr. Lyndee Leo office. -Very likely his urinary retention contributing to his UTI and sepsis  Ambulatory dysfunction -Patient with significant weakness, poor balance, likely vertigo as well, he was seen by PT/OT, patient for home with home health -MRI brain with no acute findings  GERD Stable.   DVT prophylaxis: lovenox.  Code Status: full code.  Family Communication: Discussed with patient, wife updated 12/10 Disposition: Home with homehealth tomorrow.   Consultants:   None.    Procedures: none.    Antimicrobials: rocephin for UTI.     Objective: Vitals:   08/05/19 0800 08/05/19 1351 08/05/19 2025 08/06/19 1232  BP: (!) 151/80 (!) 144/88 (!) 144/83 (!) 144/84  Pulse: 82 79 81 83  Resp:  18 18 18   Temp: 98.3 F (36.8 C) 98 F (36.7 C) 98.4 F (36.9 C) 98.5 F (36.9 C)  TempSrc: Oral Oral  Oral  SpO2:  96% 96% 96%  Weight:      Height:        Intake/Output Summary (Last 24 hours) at 08/06/2019 1802 Last data filed at 08/06/2019 1233 Gross per 24 hour  Intake 240 ml  Output 1850 ml  Net -1610 ml   Filed Weights   08/01/19 0837 08/01/19 0841 08/01/19 2024  Weight: 96.2 kg 96.2 kg 102.3 kg    Examination:  Awake Alert, Oriented X 3, No new F.N deficits, Normal affect Symmetrical Chest wall movement, Good air movement bilaterally, CTAB RRR,No Gallops,Rubs or new Murmurs, No Parasternal Heave +ve B.Sounds, Abd Soft, No tenderness, No rebound - guarding or rigidity. No Cyanosis, Clubbing or edema, No new Rash or bruise      Data Reviewed: I have personally reviewed following labs and imaging studies  CBC: Recent Labs  Lab 08/01/19 0857 08/02/19 0511 08/03/19 0850 08/05/19 0541 08/06/19 0520  WBC 12.8* 8.0 6.9 7.4 6.4  NEUTROABS 10.8*  --  4.8  --   --   HGB 15.7 14.0 15.1 14.8 15.0  HCT 49.2 44.4 47.0 44.7 45.8  MCV 92.8 93.1 91.3 89.6 89.6  PLT 154 138* 144* 137* [id number]*   Basic Metabolic Panel: Recent Labs  Lab 08/01/19 0857 08/02/19 0511 08/03/19  [id number] 08/05/19 0541 08/06/19 0520  NA 142 143 141 139 139  K 3.3* 3.9 3.7 3.6 3.5  CL 107 111 107 108 106  CO2 21* 23 22 22 23   GLUCOSE 210* 105* 110* 102* 104*  BUN 19 13 10 12 11   CREATININE 1.27* 1.15 1.09 1.07 1.06  CALCIUM 9.1 8.8* 9.2 8.6* 8.8*   GFR: Estimated Creatinine Clearance: 77.5 mL/min (by C-G formula based on SCr of 1.06 mg/dL). Liver Function Tests: Recent Labs  Lab 08/01/19 0857 08/02/19 0511  AST 37 38  ALT 39 31  ALKPHOS 70 60  BILITOT 1.5* 1.5*  PROT 7.5 6.0*   ALBUMIN 4.5 3.4*   No results for input(s): LIPASE, AMYLASE in the last 168 hours. No results for input(s): AMMONIA in the last 168 hours. Coagulation Profile: No results for input(s): INR, PROTIME in the last 168 hours. Cardiac Enzymes: No results for input(s): CKTOTAL, CKMB, CKMBINDEX, TROPONINI in the last 168 hours. BNP (last 3 results) No results for input(s): PROBNP in the last 8760 hours. HbA1C: No results for input(s): HGBA1C in the last 72 hours. CBG: Recent Labs  Lab 08/05/19 1657 08/05/19 2022 08/06/19 0800 08/06/19 1228 08/06/19 1632  GLUCAP 114* 104* 117* 134* 104*   Lipid Profile: No results for input(s): CHOL, HDL, LDLCALC, TRIG, CHOLHDL, LDLDIRECT in the last 72 hours. Thyroid Function Tests: No results for input(s): TSH, T4TOTAL, FREET4, T3FREE, THYROIDAB in the last 72 hours. Anemia Panel: No results for input(s): VITAMINB12, FOLATE, FERRITIN, TIBC, IRON, RETICCTPCT in the last 72 hours. Sepsis Labs: Recent Labs  Lab 08/01/19 0857 08/01/19 1428 08/01/19 1628 08/02/19 0511 08/05/19 0541  PROCALCITON <0.10  --   --   --  <0.10  LATICACIDVEN 7.5* 4.3* 2.8* 1.0  --     Recent Results (from the past 240 hour(s))  Blood Culture (routine x 2)     Status: None   Collection Time: 08/01/19  8:57 AM   Specimen: BLOOD  Result Value Ref Range Status   Specimen Description   Final    BLOOD RIGHT ANTECUBITAL Performed at [facility], [place] [address], [place], [place] [address]    Special Requests   Final    BOTTLES DRAWN AEROBIC AND ANAEROBIC Blood Culture adequate volume Performed at [place] [address], [place], [place] [address]    Culture   Final    NO GROWTH 5 DAYS Performed at [facility], [place] [address], [place], [place] [address]    Report Status 08/06/2019 FINAL  Final  Blood Culture (routine x 2)     Status: None   Collection Time: 08/01/19  9:02 AM   Specimen: BLOOD RIGHT FOREARM  Result  Value Ref Range Status   Specimen Description   Final    BLOOD RIGHT FOREARM Performed at [facility], [place] [address], [place], [place] [address]    Special Requests   Final    BOTTLES DRAWN AEROBIC AND ANAEROBIC Blood Culture adequate volume Performed at [place] [address], [place], [place] [address]    Culture   Final    NO GROWTH 5 DAYS Performed at [facility], [place] [address], [place], [place] [address]    Report Status 08/06/2019 FINAL  Final  SARS CORONAVIRUS 2 (TAT 6-24 HRS) Nasopharyngeal Nasopharyngeal Swab     Status: None   Collection Time: 08/01/19 11:34 AM   Specimen: Nasopharyngeal Swab  Result Value Ref Range Status  SARS Coronavirus 2 NEGATIVE NEGATIVE Final    Comment: (NOTE) SARS-CoV-2 target nucleic acids are NOT DETECTED. The SARS-CoV-2 RNA is generally detectable in upper and lower respiratory specimens during the acute phase of infection. Negative results do not preclude SARS-CoV-2 infection, do not rule out co-infections with other pathogens, and should not be used as the sole basis for treatment or other patient management decisions. Negative results must be combined with clinical observations, patient history, and epidemiological information. The expected result is Negative. Fact Sheet for Patients: SugarRoll.be Fact Sheet for Healthcare Providers: https://www.woods-mathews.com/ This test is not yet approved or cleared by the [place] FDA and  has been authorized for detection and/or diagnosis of SARS-CoV-2 by FDA under an Emergency Use Authorization (EUA). This EUA will remain  in effect (meaning this test can be used) for the duration of the COVID-19 declaration under Section 56 4(b)(1) of the Act, 21 U.S.C. section 360bbb-3(b)(1), unless the authorization is terminated or revoked sooner. Performed at [facility], [place] [address], [place],  [place] [address]   Urine culture     Status: Abnormal   Collection Time: 08/01/19  3:25 PM   Specimen: Urine, Clean Catch  Result Value Ref Range Status   Specimen Description   Final    URINE, CLEAN CATCH Performed at [facility], [place] [address], [place], [place] [address]    Special Requests   Final    NONE Performed at [facility], [place] [address], [place], [place] [address]    Culture >=100,000 COLONIES/mL STAPHYLOCOCCUS EPIDERMIDIS (A)  Final   Report Status 08/05/2019 FINAL  Final   Organism ID, Bacteria STAPHYLOCOCCUS EPIDERMIDIS (A)  Final      Susceptibility   Staphylococcus epidermidis - MIC*    CIPROFLOXACIN <=0.5 SENSITIVE Sensitive     GENTAMICIN <=0.5 SENSITIVE Sensitive     NITROFURANTOIN <=16 SENSITIVE Sensitive     OXACILLIN 0.5 RESISTANT Resistant     TETRACYCLINE 2 SENSITIVE Sensitive     VANCOMYCIN <=0.5 SENSITIVE Sensitive     TRIMETH/SULFA <=10 SENSITIVE Sensitive     CLINDAMYCIN <=0.25 SENSITIVE Sensitive     RIFAMPIN <=0.5 SENSITIVE Sensitive     Inducible Clindamycin NEGATIVE Sensitive     * >=100,000 COLONIES/mL STAPHYLOCOCCUS EPIDERMIDIS  SARS CORONAVIRUS 2 (TAT 6-24 HRS) Nasopharyngeal Nasopharyngeal Swab     Status: None   Collection Time: 08/05/19  4:41 PM   Specimen: Nasopharyngeal Swab  Result Value Ref Range Status   SARS Coronavirus 2 NEGATIVE NEGATIVE Final    Comment: (NOTE) SARS-CoV-2 target nucleic acids are NOT DETECTED. The SARS-CoV-2 RNA is generally detectable in upper and lower respiratory specimens during the acute phase of infection. Negative results do not preclude SARS-CoV-2 infection, do not rule out co-infections with other pathogens, and should not be used as the sole basis for treatment or other patient management decisions. Negative results must be combined with clinical observations, patient history, and epidemiological information. The expected result is Negative. Fact Sheet for  Patients: SugarRoll.be Fact Sheet for Healthcare Providers: https://www.woods-mathews.com/ This test is not yet approved or cleared by the [place] FDA and  has been authorized for detection and/or diagnosis of SARS-CoV-2 by FDA under an Emergency Use Authorization (EUA). This EUA will remain  in effect (meaning this test can be used) for the duration of the COVID-19 declaration under Section 56 4(b)(1) of the Act, 21 U.S.C. section 360bbb-3(b)(1), unless the authorization is terminated or revoked sooner. Performed at [facility], [place] [address] [place],  [address]          Radiology Studies: No results found.      Scheduled Meds: . aspirin  81 mg Oral Daily  . Chlorhexidine Gluconate Cloth  6 each Topical Daily  . enoxaparin (LOVENOX) injection  40 mg Subcutaneous Q24H  . insulin aspart  0-5 Units Subcutaneous QHS  . insulin aspart  0-9 Units Subcutaneous TID WC  . pantoprazole  40 mg Oral Q0600  . tamsulosin  0.8 mg Oral QHS   Continuous Infusions: . vancomycin 1,500 mg (08/06/19 0953)     LOS: 4 days        Phillips Climes, MD [facility] 08/06/2019, 6:02 PM

## 2019-08-06 NOTE — Plan of Care (Signed)
  Problem: Health Behavior/Discharge Planning: Goal: Ability to manage health-related needs will improve Outcome: Progressing   Problem: Activity: Goal: Risk for activity intolerance will decrease Outcome: Progressing   Problem: Clinical Measurements: Goal: Will remain free from infection Outcome: Completed/Met   Problem: Nutrition: Goal: Adequate nutrition will be maintained Outcome: Completed/Met   Problem: Safety: Goal: Ability to remain free from injury will improve Outcome: Completed/Met

## 2019-08-06 NOTE — Progress Notes (Signed)
Physical Therapy Treatment Patient Details Name: James Gates MRN: [id number] DOB: [birth year] Today's Date: 08/06/2019    History of Present Illness Pt is  68 y.o. male with medical history significant of BPH, GERD, prediabetes, back surgery, L THA, and sleep apnes. Pt with c/o generalized weakness and admitted with UTI .  MRI brain: "Normal appearance of the brain for age. Very minimal small vesselchange of the hemispheric white matter. Small right mastoid effusion."    PT Comments    Pt assisted with ambulating in hallway and reports feeling much better today.  Challenged pt's balance with head turning during ambulation, and pt required min assist for LOB.  Pt educated on appropriate use of RW and maintaining stationary object focus for safety once home.   Follow Up Recommendations  Home health PT;Supervision for mobility/OOB     Equipment Recommendations  Rolling walker with 5" wheels    Recommendations for Other Services       Precautions / Restrictions Precautions Precautions: Fall    Mobility  Bed Mobility               General bed mobility comments: pt in recliner  Transfers Overall transfer level: Needs assistance Equipment used: Rolling walker (2 wheeled) Transfers: Sit to/from Stand Sit to Stand: Supervision         General transfer comment: verbal cues for hand placement  Ambulation/Gait Ambulation/Gait assistance: Min guard;Min assist Gait Distance (Feet): 300 Feet Assistive device: Rolling walker (2 wheeled) Gait Pattern/deviations: Step-through pattern;Decreased stride length;Trunk flexed Gait velocity: improved speed today (pt reports closer to his baseline pace)   General Gait Details: verbal cues for safe use of RW, cues for focusing on stationary objects and turning slowly as a unit; pt reports 2/10 dizziness today with focusing however 4/10 dizziness with head turns; performed head turns for approx 100 feet and pt required min assist for  LOB   Stairs             Wheelchair Mobility    Modified Rankin (Stroke Patients Only)       Balance                                            Cognition Arousal/Alertness: Awake/alert Behavior During Therapy: WFL for tasks assessed/performed Overall Cognitive Status: Within Functional Limits for tasks assessed                                        Exercises      General Comments        Pertinent Vitals/Pain Pain Assessment: No/denies pain    Home Living                      Prior Function            PT Goals (current goals can now be found in the care plan section) Progress towards PT goals: Progressing toward goals    Frequency    Min 3X/week      PT Plan Current plan remains appropriate    Co-evaluation              AM-PAC PT "6 Clicks" Mobility   Outcome Measure  Help needed turning from your back to your side while in a flat bed  without using bedrails?: A Little Help needed moving from lying on your back to sitting on the side of a flat bed without using bedrails?: A Little Help needed moving to and from a bed to a chair (including a wheelchair)?: A Little Help needed standing up from a chair using your arms (e.g., wheelchair or bedside chair)?: A Little Help needed to walk in hospital room?: A Little Help needed climbing 3-5 steps with a railing? : A Little 6 Click Score: 18    End of Session Equipment Utilized During Treatment: Gait belt Activity Tolerance: Patient tolerated treatment well Patient left: in chair;with call bell/phone within reach Nurse Communication: Mobility status PT Visit Diagnosis: Unsteadiness on feet (R26.81);Other abnormalities of gait and mobility (R26.89)     Time: [id number] PT Time Calculation (min) (ACUTE ONLY): 18 min  Charges:  $Gait Training: 8-22 mins                    Arlyce Dice, DPT Acute Rehabilitation Services Office:  [phone]  Trena Platt 08/06/2019, 12:56 PM

## 2019-08-07 LAB — GLUCOSE, CAPILLARY
Glucose-Capillary: 107 mg/dL — ABNORMAL HIGH (ref 70–99)
Glucose-Capillary: 130 mg/dL — ABNORMAL HIGH (ref 70–99)

## 2019-08-07 MED ORDER — SULFAMETHOXAZOLE-TRIMETHOPRIM 400-80 MG PO TABS: 1.0000 | ORAL_TABLET | Freq: Two times a day (BID) | ORAL | 0 refills | Status: AC

## 2019-08-07 NOTE — Discharge Summary (Signed)
Physician Discharge Summary  James Gates [id number] DOB: Oct 04, 1950 DOA: 08/01/2019  PCP: Bartholome Bill, MD  Admit date: 08/01/2019 Discharge date: 08/07/2019   Code Status: Full Code  Admitted From: Home Discharged to:  Home Home Health:PT Supervision for mobility  Equipment/Devices:Rolling walker  Discharge Condition:stable   Recommendations for Outpatient Follow-up   1. Follow up with PCP in 1 week 2. Discharged with Foley catheter. Follow up with Urology in one week for void trials.   Hospital Summary  This is a 68 year old male with a history of prediabetes, GERD, BPH, who presented with nausea, decreased PO intake and generalized weakness. In the ED: vitals were stable. Initial blood work shows a potassium of 3.3, creatinine 1.2, total bilirubin slightly elevated at 1.5. Initial lactic acid was found to be quite high at 7.5, and improved after fluids to 4.3. His white count was 12.8. His urinalysis show evidence of UTI with large leukocyte esterase, bacteria present as well as WBC. He underwent a CT scan of the abdomen pelvis without acute findings. He was given Zosyn x1 and given persistently elevated lactic acid admitted for sepsis secondary to UTI.  He was continued on Ceftriaxone but cultures came back as Staph epidermidis resistant to PCN. He was switched to Vancomycin on 12/10.  He also had a foley catheter placed due to urinary retention requiring coude insertion 12/9.  He was discharged in stable condition with foley catheter and plans for follow up in Urology office for void trials as well as Bactrim BID x 10 days antibiotics total (Last day 12/19)   A & P   Active Problems:   Lactic acidosis   Sepsis secondary to oxacillin resistant Staph epidermidis complicated UTI  -Sepsis present on admission, elevated lactic acid, leukocytosis, tachycardic and tachypneic -Sepsis now resolved -Blood cultures with no growth to date -12/10 change Rocephin to  vancomycin -Discharged on Bactrim Bid x 7 days (10 days antibiotics total from start of Vancomycin) for complicated UTI.   Urinary retention/BPH  -Patient with history of BPH, on Flomax 0.8 mg p.o. nightly, had urinary retention required coud insertion 12/9 with 2.1 L output, discussed with urology Dr. Junious Silk, covering for Dr. Loel Lofty, patient will be discharged with Foley catheter, appointment has been already arranged for next week for voiding trials at Dr. Lyndee Leo office.  -Very likely his urinary retention contributing to his UTI and sepsis   Ambulatory dysfunction  -Patient with significant weakness, poor balance, likely vertigo as well, he was seen by PT/OT, patient for home with home health  -MRI brain with no acute findings   GERD  Stable.     Consultants  . none  Procedures  . none  Antibiotics   Anti-infectives (From admission, onward)   Start     Dose/Rate Route Frequency Ordered Stop   08/08/19 0000  sulfamethoxazole-trimethoprim (BACTRIM) 400-80 MG tablet     1 tablet Oral 2 times daily 08/07/19 0846 08/15/19 2359   08/06/19 0900  vancomycin (VANCOCIN) 1,500 mg in sodium chloride 0.9 % 500 mL IVPB     1,500 mg 250 mL/hr over 120 Minutes Intravenous Every 24 hours 08/05/19 0834 08/08/19 0859   08/05/19 0845  vancomycin (VANCOCIN) 2,000 mg in sodium chloride 0.9 % 500 mL IVPB     2,000 mg 250 mL/hr over 120 Minutes Intravenous  Once 08/05/19 0834 08/05/19 1313   08/01/19 1800  cefTRIAXone (ROCEPHIN) 1 g in sodium chloride 0.9 % 100 mL IVPB  Status:  Discontinued  1 g 200 mL/hr over 30 Minutes Intravenous Every 24 hours 08/01/19 1710 08/05/19 0815   08/01/19 1145  piperacillin-tazobactam (ZOSYN) IVPB 3.375 g     3.375 g 100 mL/hr over 30 Minutes Intravenous  Once 08/01/19 1132 08/01/19 1443        Subjective  Patient seen and examined at bedside no acute distress and resting comfortably.  No events overnight.  Tolerating diet. In good spirits and  anticipating discharge. Admits to persistent vertigo with ambulation  Denies any chest pain, shortness of breath, fever, nausea, vomiting, urinary or bowel complaints. Otherwise ROS negative   Objective   Discharge Exam: Vitals:   08/06/19 2105 08/07/19 0529  BP: 136/82 136/86  Pulse: 69 69  Resp: 18 16  Temp: 98.3 F (36.8 C) 98.6 F (37 C)  SpO2: 97% 95%   Vitals:   08/05/19 2025 08/06/19 1232 08/06/19 2105 08/07/19 0529  BP: (!) 144/83 (!) 144/84 136/82 136/86  Pulse: 81 83 69 69  Resp: 18 18 18 16   Temp: 98.4 F (36.9 C) 98.5 F (36.9 C) 98.3 F (36.8 C) 98.6 F (37 C)  TempSrc:  Oral Oral Oral  SpO2: 96% 96% 97% 95%  Weight:      Height:        Physical Exam Vitals and nursing note reviewed.  Constitutional:      Appearance: Normal appearance.  HENT:     Head: Normocephalic and atraumatic.     Nose: Nose normal.     Mouth/Throat:     Mouth: Mucous membranes are moist.  Eyes:     Extraocular Movements: Extraocular movements intact.  Cardiovascular:     Rate and Rhythm: Normal rate and regular rhythm.  Pulmonary:     Effort: Pulmonary effort is normal.     Breath sounds: Normal breath sounds.  Abdominal:     General: Abdomen is flat.     Palpations: Abdomen is soft.  Genitourinary:    Comments: Foley in place Musculoskeletal:        General: No swelling.     Cervical back: Normal range of motion. No rigidity.  Neurological:     Mental Status: He is alert.  Psychiatric:        Mood and Affect: Mood normal.        Behavior: Behavior normal.       The results of significant diagnostics from this hospitalization (including imaging, microbiology, ancillary and laboratory) are listed below for reference.     Microbiology: Recent Results (from the past 240 hour(s))  Blood Culture (routine x 2)     Status: None   Collection Time: 08/01/19  8:57 AM   Specimen: BLOOD  Result Value Ref Range Status   Specimen Description   Final    BLOOD RIGHT  ANTECUBITAL Performed at [facility] [address], [place], [place] [address]    Special Requests   Final    BOTTLES DRAWN AEROBIC AND ANAEROBIC Blood Culture adequate volume Performed at [facility] [address] [place], [place] [address]    Culture   Final    NO GROWTH 5 DAYS Performed at [facility], [place] [address], [place], [place] [address]    Report Status 08/06/2019 FINAL  Final  Blood Culture (routine x 2)     Status: None   Collection Time: 08/01/19  9:02 AM   Specimen: BLOOD RIGHT FOREARM  Result Value Ref Range Status   Specimen Description   Final  BLOOD RIGHT FOREARM Performed at [facility], [place] [address], [place], [place] [address]    Special Requests   Final    BOTTLES DRAWN AEROBIC AND ANAEROBIC Blood Culture adequate volume Performed at [facility] [address] [place], [place] [address]    Culture   Final    NO GROWTH 5 DAYS Performed at [facility], [place] [address] [place], [place] [address]    Report Status 08/06/2019 FINAL  Final  SARS CORONAVIRUS 2 (TAT 6-24 HRS) Nasopharyngeal Nasopharyngeal Swab     Status: None   Collection Time: 08/01/19 11:34 AM   Specimen: Nasopharyngeal Swab  Result Value Ref Range Status   SARS Coronavirus 2 NEGATIVE NEGATIVE Final    Comment: (NOTE) SARS-CoV-2 target nucleic acids are NOT DETECTED. The SARS-CoV-2 RNA is generally detectable in upper and lower respiratory specimens during the acute phase of infection. Negative results do not preclude SARS-CoV-2 infection, do not rule out co-infections with other pathogens, and should not be used as the sole basis for treatment or other patient management decisions. Negative results must be combined with clinical observations, patient history, and epidemiological information. The expected result is Negative. Fact Sheet for Patients: SugarRoll.be Fact  Sheet for Healthcare Providers: https://www.woods-mathews.com/ This test is not yet approved or cleared by the [place] FDA and  has been authorized for detection and/or diagnosis of SARS-CoV-2 by FDA under an Emergency Use Authorization (EUA). This EUA will remain  in effect (meaning this test can be used) for the duration of the COVID-19 declaration under Section 56 4(b)(1) of the Act, 21 U.S.C. section 360bbb-3(b)(1), unless the authorization is terminated or revoked sooner. Performed at [facility], [place] [address] [place], [place] [address]   Urine culture     Status: Abnormal   Collection Time: 08/01/19  3:25 PM   Specimen: Urine, Clean Catch  Result Value Ref Range Status   Specimen Description   Final    URINE, CLEAN CATCH Performed at [facility], [place] [address] [place],  [address]    Special Requests   Final    NONE Performed at [facility], [place] [address] [place], [place] [address]    Culture >=100,000 COLONIES/mL STAPHYLOCOCCUS EPIDERMIDIS (A)  Final   Report Status 08/05/2019 FINAL  Final   Organism ID, Bacteria STAPHYLOCOCCUS EPIDERMIDIS (A)  Final      Susceptibility   Staphylococcus epidermidis - MIC*    CIPROFLOXACIN <=0.5 SENSITIVE Sensitive     GENTAMICIN <=0.5 SENSITIVE Sensitive     NITROFURANTOIN <=16 SENSITIVE Sensitive     OXACILLIN 0.5 RESISTANT Resistant     TETRACYCLINE 2 SENSITIVE Sensitive     VANCOMYCIN <=0.5 SENSITIVE Sensitive     TRIMETH/SULFA <=10 SENSITIVE Sensitive     CLINDAMYCIN <=0.25 SENSITIVE Sensitive     RIFAMPIN <=0.5 SENSITIVE Sensitive     Inducible Clindamycin NEGATIVE Sensitive     * >=100,000 COLONIES/mL STAPHYLOCOCCUS EPIDERMIDIS  SARS CORONAVIRUS 2 (TAT 6-24 HRS) Nasopharyngeal Nasopharyngeal Swab     Status: None   Collection Time: 08/05/19  4:41 PM   Specimen: Nasopharyngeal Swab  Result Value Ref Range Status   SARS Coronavirus 2 NEGATIVE NEGATIVE  Final    Comment: (NOTE) SARS-CoV-2 target nucleic acids are NOT DETECTED. The SARS-CoV-2 RNA is generally detectable in upper and lower respiratory specimens during the acute phase of infection. Negative results do not preclude SARS-CoV-2 infection, do not rule out co-infections with other  pathogens, and should not be used as the sole basis for treatment or other patient management decisions. Negative results must be combined with clinical observations, patient history, and epidemiological information. The expected result is Negative. Fact Sheet for Patients: SugarRoll.be Fact Sheet for Healthcare Providers: https://www.woods-mathews.com/ This test is not yet approved or cleared by the [place] FDA and  has been authorized for detection and/or diagnosis of SARS-CoV-2 by FDA under an Emergency Use Authorization (EUA). This EUA will remain  in effect (meaning this test can be used) for the duration of the COVID-19 declaration under Section 56 4(b)(1) of the Act, 21 U.S.C. section 360bbb-3(b)(1), unless the authorization is terminated or revoked sooner. Performed at [facility], [place] [address] [place], [place] [address]      Labs: BNP (last 3 results) No results for input(s): BNP in the last 8760 hours. Basic Metabolic Panel: Recent Labs  Lab 08/01/19 0857 08/02/19 0511 08/03/19 0850 08/05/19 0541 08/06/19 0520  NA 142 143 141 139 139  K 3.3* 3.9 3.7 3.6 3.5  CL 107 111 107 108 106  CO2 21* 23 22 22 23   GLUCOSE 210* 105* 110* 102* 104*  BUN 19 13 10 12 11   CREATININE 1.27* 1.15 1.09 1.07 1.06  CALCIUM 9.1 8.8* 9.2 8.6* 8.8*   Liver Function Tests: Recent Labs  Lab 08/01/19 0857 08/02/19 0511  AST 37 38  ALT 39 31  ALKPHOS 70 60  BILITOT 1.5* 1.5*  PROT 7.5 6.0*  ALBUMIN 4.5 3.4*   No results for input(s): LIPASE, AMYLASE in the last 168 hours. No results for input(s): AMMONIA in the last 168  hours. CBC: Recent Labs  Lab 08/01/19 0857 08/02/19 0511 08/03/19 0850 08/05/19 0541 08/06/19 0520  WBC 12.8* 8.0 6.9 7.4 6.4  NEUTROABS 10.8*  --  4.8  --   --   HGB 15.7 14.0 15.1 14.8 15.0  HCT 49.2 44.4 47.0 44.7 45.8  MCV 92.8 93.1 91.3 89.6 89.6  PLT 154 138* 144* 137* 142*   Cardiac Enzymes: No results for input(s): CKTOTAL, CKMB, CKMBINDEX, TROPONINI in the last 168 hours. BNP: Invalid input(s): POCBNP CBG: Recent Labs  Lab 08/06/19 0800 08/06/19 1228 08/06/19 1632 08/06/19 2059 08/07/19 0747  GLUCAP 117* 134* 104* 117* 107*   D-Dimer No results for input(s): DDIMER in the last 72 hours. Hgb A1c No results for input(s): HGBA1C in the last 72 hours. Lipid Profile No results for input(s): CHOL, HDL, LDLCALC, TRIG, CHOLHDL, LDLDIRECT in the last 72 hours. Thyroid function studies No results for input(s): TSH, T4TOTAL, T3FREE, THYROIDAB in the last 72 hours.  Invalid input(s): FREET3 Anemia work up No results for input(s): VITAMINB12, FOLATE, FERRITIN, TIBC, IRON, RETICCTPCT in the last 72 hours. Urinalysis    Component Value Date/Time   COLORURINE YELLOW 08/01/2019 1525   APPEARANCEUR HAZY (A) 08/01/2019 1525   LABSPEC 1.017 08/01/2019 1525   PHURINE 7.0 08/01/2019 1525   GLUCOSEU 50 (A) 08/01/2019 1525   HGBUR NEGATIVE 08/01/2019 1525   BILIRUBINUR NEGATIVE 08/01/2019 1525   BILIRUBINUR n 10/11/2013 1157   KETONESUR 5 (A) 08/01/2019 1525   PROTEINUR NEGATIVE 08/01/2019 1525   UROBILINOGEN 0.2 04/13/2015 1141   NITRITE NEGATIVE 08/01/2019 1525   LEUKOCYTESUR LARGE (A) 08/01/2019 1525   Sepsis Labs Invalid input(s): PROCALCITONIN,  WBC,  LACTICIDVEN Microbiology Recent Results (from the past 240 hour(s))  Blood Culture (routine x 2)     Status: None   Collection Time: 08/01/19  8:57 AM  Specimen: BLOOD  Result Value Ref Range Status   Specimen Description   Final    BLOOD RIGHT ANTECUBITAL Performed at [facility]  [address], [place], [place] [address]    Special Requests   Final    BOTTLES DRAWN AEROBIC AND ANAEROBIC Blood Culture adequate volume Performed at [facility] [address], [place], [place] [address]    Culture   Final    NO GROWTH 5 DAYS Performed at [facility], [place] [address] [place], [place] [address]    Report Status 08/06/2019 FINAL  Final  Blood Culture (routine x 2)     Status: None   Collection Time: 08/01/19  9:02 AM   Specimen: BLOOD RIGHT FOREARM  Result Value Ref Range Status   Specimen Description   Final    BLOOD RIGHT FOREARM Performed at [facility], [place] [address] [place], [place] [address]    Special Requests   Final    BOTTLES DRAWN AEROBIC AND ANAEROBIC Blood Culture adequate volume Performed at [place] [address] [place], [place] [address]    Culture   Final    NO GROWTH 5 DAYS Performed at [facility], [place] [address], [place], [place] [address]    Report Status 08/06/2019 FINAL  Final  SARS CORONAVIRUS 2 (TAT 6-24 HRS) Nasopharyngeal Nasopharyngeal Swab     Status: None   Collection Time: 08/01/19 11:34 AM   Specimen: Nasopharyngeal Swab  Result Value Ref Range Status   SARS Coronavirus 2 NEGATIVE NEGATIVE Final    Comment: (NOTE) SARS-CoV-2 target nucleic acids are NOT DETECTED. The SARS-CoV-2 RNA is generally detectable in upper and lower respiratory specimens during the acute phase of infection. Negative results do not preclude SARS-CoV-2 infection, do not rule out co-infections with other pathogens, and should not be used as the sole basis for treatment or other patient management decisions. Negative results must be combined with clinical observations, patient history, and epidemiological information. The expected result is Negative. Fact Sheet for Patients: SugarRoll.be Fact Sheet for Healthcare  Providers: https://www.woods-mathews.com/ This test is not yet approved or cleared by the [place] FDA and  has been authorized for detection and/or diagnosis of SARS-CoV-2 by FDA under an Emergency Use Authorization (EUA). This EUA will remain  in effect (meaning this test can be used) for the duration of the COVID-19 declaration under Section 56 4(b)(1) of the Act, 21 U.S.C. section 360bbb-3(b)(1), unless the authorization is terminated or revoked sooner. Performed at [facility], [place] [address] [place], [place] [address]   Urine culture     Status: Abnormal   Collection Time: 08/01/19  3:25 PM   Specimen: Urine, Clean Catch  Result Value Ref Range Status   Specimen Description   Final    URINE, CLEAN CATCH Performed at [facility], [place] [address] [place], [place] [address]    Special Requests   Final    NONE Performed at [facility], [place] [address] [place], [place] [address]    Culture >=100,000 COLONIES/mL STAPHYLOCOCCUS EPIDERMIDIS (A)  Final   Report Status 08/05/2019 FINAL  Final   Organism ID, Bacteria STAPHYLOCOCCUS EPIDERMIDIS (A)  Final      Susceptibility   Staphylococcus epidermidis - MIC*    CIPROFLOXACIN <=0.5 SENSITIVE Sensitive     GENTAMICIN <=0.5 SENSITIVE Sensitive     NITROFURANTOIN <=16 SENSITIVE Sensitive     OXACILLIN 0.5 RESISTANT  Resistant     TETRACYCLINE 2 SENSITIVE Sensitive     VANCOMYCIN <=0.5 SENSITIVE Sensitive     TRIMETH/SULFA <=10 SENSITIVE Sensitive     CLINDAMYCIN <=0.25 SENSITIVE Sensitive     RIFAMPIN <=0.5 SENSITIVE Sensitive     Inducible Clindamycin NEGATIVE Sensitive     * >=100,000 COLONIES/mL STAPHYLOCOCCUS EPIDERMIDIS  SARS CORONAVIRUS 2 (TAT 6-24 HRS) Nasopharyngeal Nasopharyngeal Swab     Status: None   Collection Time: 08/05/19  4:41 PM   Specimen: Nasopharyngeal Swab  Result Value Ref Range Status   SARS Coronavirus 2 NEGATIVE NEGATIVE Final    Comment:  (NOTE) SARS-CoV-2 target nucleic acids are NOT DETECTED. The SARS-CoV-2 RNA is generally detectable in upper and lower respiratory specimens during the acute phase of infection. Negative results do not preclude SARS-CoV-2 infection, do not rule out co-infections with other pathogens, and should not be used as the sole basis for treatment or other patient management decisions. Negative results must be combined with clinical observations, patient history, and epidemiological information. The expected result is Negative. Fact Sheet for Patients: SugarRoll.be Fact Sheet for Healthcare Providers: https://www.woods-mathews.com/ This test is not yet approved or cleared by the [place] FDA and  has been authorized for detection and/or diagnosis of SARS-CoV-2 by FDA under an Emergency Use Authorization (EUA). This EUA will remain  in effect (meaning this test can be used) for the duration of the COVID-19 declaration under Section 56 4(b)(1) of the Act, 21 U.S.C. section 360bbb-3(b)(1), unless the authorization is terminated or revoked sooner. Performed at [facility], [place] [address] [place], [place] [address]     Discharge Instructions     Discharge Instructions    Continue foley catheter   Complete by: As directed    Continue foley catheter at discharge   Diet - low sodium heart healthy   Complete by: As directed    Discharge instructions   Complete by: As directed    You were seen and examined in the hospital for sepsis from a urinary tract infection and cared for by a hospitalist.   Upon Discharge:  -Take Bactrim twice daily starting 12/13 for 7 days. Do not skip doses -Continue your foley catheter at discharge and follow up with your urologist this week as scheduled.  Make an appointment with your primary care physician within 7 days Bring all home medications to your appointment to review Request that your primary  physician go over all hospital tests and procedures/radiological results at the follow up.   Please get all hospital records sent to your physician by signing a hospital release before you go home.     Read the complete instructions along with all the possible side effects for all the medicines you take and that have been prescribed to you. Take any new medicines after you have completely understood and accept all the possible adverse reactions/side effects.   If you have any questions about your discharge medications or the care you received while you were in the hospital, you can call the unit and asked to speak with the hospitalist on call. Once you are discharged, your primary care physician will handle any further medical issues. Please note that NO REFILLS for any discharge medications will be authorized, as it is imperative that you return to your primary care physician (or establish a relationship with a primary care physician if you do not have one) for your aftercare needs so that they can reassess your need for medications and monitor your  lab values.   Do not drive, operate heavy machinery, perform activities at heights, swimming or participation in water activities or provide baby sitting services if your were admitted for loss of consciousness/seizures or if you are on sedating medications including, but not limited to benzodiazepines, sleep medications, narcotic pain medications, etc., until you have been cleared to do so by a medical doctor.   Do not take more than prescribed medications.   Wear a seat belt while driving.  If you have smoked or chewed Tobacco in the last 2 years please stop smoking; also stop any regular Alcohol and/or any Recreational drug use including marijuana.  If you experience worsening of your admission symptoms or develop shortness of breath, chest pain, suicidal or homicidal thoughts or experience a life threatening emergency, you must seek medical  attention immediately by calling 911 or calling your PCP immediately.   Increase activity slowly   Complete by: As directed      Allergies as of 08/07/2019   No Known Allergies     Medication List    TAKE these medications   aspirin 81 MG tablet Take 81 mg by mouth daily.   HYDROcodone-acetaminophen 5-325 MG tablet Commonly known as: NORCO/VICODIN Take 0.5-1 tablets by mouth every 4 (four) hours as needed (pain).   KRILL OIL PO Take 1 capsule by mouth daily.   naproxen sodium 220 MG tablet Commonly known as: ALEVE Take 220 mg by mouth 2 (two) times daily as needed (pain).   sulfamethoxazole-trimethoprim 400-80 MG tablet Commonly known as: Bactrim Take 1 tablet by mouth 2 (two) times daily for 7 days. Start taking on: August 08, 2019   tamsulosin 0.4 MG Caps capsule Commonly known as: FLOMAX Take 0.8 mg by mouth at bedtime.   TURMERIC PO Take 1 capsule by mouth daily.            Durable Medical Equipment  (From admission, onward)         Start     Ordered   08/04/19 1608  For home use only DME Walker rolling  Once    Question:  Patient needs a walker to treat with the following condition  Answer:  Unsteady gait   08/04/19 1607         Follow-up Information    Kathie Rhodes, MD. Schedule an appointment as soon as possible for a visit.   Specialty: Urology Contact information: [address] [place] [address] [phone]          No Known Allergies  Time coordinating discharge: Over 30 minutes   SIGNED:   Harold Hedge, D.O. [facility] Pager: [phone]  08/07/2019, 8:47 AM

## 2019-08-07 NOTE — Progress Notes (Signed)
Patient discharged home as ordered. FC intact and draining clear, yellow urine to drainage bag at time of D/C. [facility] teaching provided to pt. Pt was able to empty foley drainage bag independently and provide teach-back teaching. Pt verbalized confidence in foley care at home. Home supplies and leg bag sent with patient.

## 2019-08-11 ENCOUNTER — Ambulatory Visit: Payer: Medicare Other | Admitting: Cardiovascular Disease

## 2019-10-04 ENCOUNTER — Ambulatory Visit: Payer: Medicare PPO

## 2019-10-21 ENCOUNTER — Ambulatory Visit: Payer: Medicare Other

## 2020-02-21 ENCOUNTER — Ambulatory Visit: Payer: Medicare PPO | Admitting: Cardiovascular Disease

## 2020-02-21 ENCOUNTER — Other Ambulatory Visit: Payer: Self-pay

## 2020-02-21 ENCOUNTER — Encounter: Payer: Self-pay | Admitting: Cardiovascular Disease

## 2020-02-21 VITALS — BP 120/68 | HR 81 | Ht 69.0 in | Wt 214.4 lb

## 2020-02-21 DIAGNOSIS — G4733 Obstructive sleep apnea (adult) (pediatric): Secondary | ICD-10-CM | POA: Diagnosis not present

## 2020-02-21 DIAGNOSIS — E7800 Pure hypercholesterolemia, unspecified: Secondary | ICD-10-CM | POA: Diagnosis not present

## 2020-02-21 DIAGNOSIS — E669 Obesity, unspecified: Secondary | ICD-10-CM | POA: Diagnosis not present

## 2020-02-21 DIAGNOSIS — R7303 Prediabetes: Secondary | ICD-10-CM | POA: Diagnosis not present

## 2020-02-21 MED ORDER — EZETIMIBE 10 MG PO TABS: 10.0000 mg | ORAL_TABLET | Freq: Every day | ORAL | 3 refills | Status: DC

## 2020-02-21 NOTE — Progress Notes (Signed)
Patient ID: [name], male   DOB: May 11, 1951, 69 y.o.   MRN: [id number]     Primary: [facility]   HPI: James Gates is a 69 y.o. male who presents for follow-up cardiology/sleep evaluation.  I last saw him in January 2017.  James Gates is the husband of James Gates.  In 2009 he underwent a sleep study which revealed mild  to moderate sleep apnea with an AHI of 12.8 overall endurance in rem sleep 20.1.  There was moderate snoring and he dropped his oxygen 82%.  In January 2010 he underwent a CPAP titration and 15 cm water pressure was recommended.  In states that he had been using CPAP fairly well but underwent right hip surgery in August 2016 and essentially has not used therapy since that time.  A download, which was obtained from July 25 through 04/18/2015 showed reduced compliance with only 17 out of 30 days with device usage and his average usage was only 3 hours and 54 minutes.  However, his AHI when used was excellent at 1.1.  He has a [place] CPAP unit.  He states that he has lost 25 pounds since his hip surgery.  He also has back issues in the future may require back surgery.    Epworth Sleepiness Scale: Situation   Chance of Dozing/Sleeping (0 = never , 1 = slight chance , 2 = moderate chance , 3 = high chance )   sitting and reading 0   watching TV 1   sitting inactive in a public place 0   being a passenger in a motor vehicle for an hour or more 0   lying down in the afternoon 2   sitting and talking to someone 0   sitting quietly after lunch (no alcohol) 1   while stopped for a few minutes in traffic as the driver 0   Total Score  4   Since I last saw him, he believes he is sleeping well.  He has not been using CPAP therapy.  He was hospitalized with sepsis secondary to oxacillin resistant staph epidermidis complicated UTI in December 2020.  He remains fairly active.  He typically goes to bed at 10 PM and wakes up feeling restored.  He is  followed by the [facility] and laboratory in March 2021 showed hemoglobin A1c at 5.7.  Total cholesterol was 224 with LDL cholesterol 146.  Remotely he had developed mild myalgia on statin therapy.  He presents for a 4-year follow-up evaluation.   Past Medical History:  Diagnosis Date   Arthritis    BPH (benign prostatic hypertrophy) with urinary retention    Colon polyps    GERD (gastroesophageal reflux disease)    History of BPH    Hyperlipidemia    Numbness and tingling of both legs    PONV (postoperative nausea and vomiting)    Pre-diabetes    PTSD (post-traumatic stress disorder)    [facility]; being consulted at the [place] in [place].    Sleep apnea    cpap   Sleep apnea, obstructive 2011   sleep study done @ [place]; does not wear CPAP at this moment   Spinal stenosis of lumbar region    Wears partial dentures     Past Surgical History:  Procedure Laterality Date   COLONOSCOPY     COLONOSCOPY W/ POLYPECTOMY     CYST EXCISION     on back   LUMBAR LAMINECTOMY/DECOMPRESSION MICRODISCECTOMY N/A 12/20/2015   Procedure:  Lumbar two-Lumbar five Decompressive Laminectomy;  Surgeon: Jovita Gamma, MD;  Location: [facility];  Service: Neurosurgery;  Laterality: N/A;   LUMBAR LAMINECTOMY/DECOMPRESSION MICRODISCECTOMY Right 02/16/2018   Procedure: LUMBAR ONE-TWO LAMINOTOMY AND MICRODISCECTOMY;  Surgeon: Jovita Gamma, MD;  Location: [facility];  Service: Neurosurgery;  Laterality: Right;  LUMBAR ONE-TWO LAMINOTOMY AND MICRODISCECTOMY   POLYPECTOMY     TOTAL HIP ARTHROPLASTY Right 04/24/2015   Procedure: TOTAL HIP ARTHROPLASTY ANTERIOR APPROACH;  Surgeon: Frederik Pear, MD;  Location: [facility];  Service: Orthopedics;  Laterality: Right;    No Known Allergies  Current Outpatient Medications  Medication Sig Dispense Refill   aspirin 81 MG tablet Take 81 mg by mouth daily.     KRILL OIL PO Take 1 capsule by mouth daily.      tamsulosin (FLOMAX) 0.4 MG CAPS capsule Take  0.8 mg by mouth at bedtime.      TURMERIC PO Take 1 capsule by mouth daily.      ezetimibe (ZETIA) 10 MG tablet Take 1 tablet (10 mg total) by mouth daily. 90 tablet 3   No current facility-administered medications for this visit.    Social History   Socioeconomic History   Marital status: Married    Spouse name: Not on file   Number of children: Not on file   Years of education: Not on file   Highest education level: Not on file  Occupational History   Not on file  Tobacco Use   Smoking status: Former Smoker    Quit date: 08/26/1981    Years since quitting: 38.5   Smokeless tobacco: Never Used  Vaping Use   Vaping Use: Never used  Substance and Sexual Activity   Alcohol use: Yes    Alcohol/week: 6.0 standard drinks    Types: 6 Standard drinks or equivalent per week   Drug use: No   Sexual activity: Yes  Other Topics Concern   Not on file  Social History Narrative   Not on file   Social Determinants of Health   Financial Resource Strain:    Difficulty of Paying Living Expenses:   Food Insecurity:    Worried About Charity fundraiser in the Last Year:    Arboriculturist in the Last Year:   Transportation Needs:    Film/video editor (Medical):    Lack of Transportation (Non-Medical):   Physical Activity:    Days of Exercise per Week:    Minutes of Exercise per Session:   Stress:    Feeling of Stress :   Social Connections:    Frequency of Communication with Friends and Family:    Frequency of Social Gatherings with Friends and Family:    Attends Religious Services:    Active Member of Clubs or Organizations:    Attends Archivist Meetings:    Marital Status:   Intimate Partner Violence:    Fear of Current or Ex-Partner:    Emotionally Abused:    Physically Abused:    Sexually Abused:    Additional social history is notable that he is married to [place] for 37 years.  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  Family History  Problem Relation Age of Onset   Lung cancer Father    Ulcers Other    Colon cancer Neg Hx      ROS General: Negative; No fevers, chills, or night sweats HEENT: Negative; No changes in vision or hearing, sinus congestion, difficulty  swallowing Pulmonary: Negative; No cough, wheezing, shortness of breath, hemoptysis Cardiovascular: Negative; No chest pain, presyncope, syncope, palpatations GI: Negative; No nausea, vomiting, diarrhea, or abdominal pain GU: Negative; No dysuria, hematuria, or difficulty voiding Musculoskeletal: Negative; no myalgias, joint pain, or weakness Hematologic: Negative; no easy bruising, bleeding Endocrine: Negative; no heat/cold intolerance Neuro: Negative; no changes in balance, headaches Skin: Negative; No rashes or skin lesions Psychiatric: Negative; No behavioral problems, depression Sleep: Positive for previously diagnosed OSA, not on treatment.  He denies snoring or daytime sleepiness, bruxism, restless legs, hypnogognic hallucinations, no cataplexy   Physical Exam BP 120/68    Pulse 81    Ht _0  (1.753 m)    Wt 214 lb 6.4 oz (97.3 kg)    SpO2 94%    BMI 31.66 kg/m    Repeat blood pressure by me was 122/70  Wt Readings from Last 3 Encounters:  02/21/20 214 lb 6.4 oz (97.3 kg)  08/01/19 225 lb 8.5 oz (102.3 kg)  02/10/18 214 lb 12.8 oz (97.4 kg)   General: Alert, oriented, no distress.  Skin: normal turgor, no rashes, warm and dry HEENT: Normocephalic, atraumatic. Pupils equal round and reactive to light; sclera anicteric; extraocular muscles intact;  Nose without nasal septal hypertrophy Mouth/Parynx benign; Mallinpatti scale 3 Neck: No JVD, no carotid bruits; normal carotid upstroke Lungs: clear to ausculatation and percussion; no wheezing or rales Chest wall: without tenderness to palpitation Heart: PMI not displaced, RRR, s1 s2 normal, 1/6 systolic murmur, no diastolic murmur, no rubs, gallops,  thrills, or heaves Abdomen: soft, nontender; no hepatosplenomehaly, BS+; abdominal aorta nontender and not dilated by palpation. Back: no CVA tenderness Pulses 2+ Musculoskeletal: full range of motion, normal strength, no joint deformities Extremities: no clubbing cyanosis or edema, Homan's sign negative  Neurologic: grossly nonfocal; Cranial nerves grossly wnl Psychologic: Normal mood and affect   ECG (independently read by me): Normal sinus rhythm at 81 bpm.  Mild RV conduction delay.  Normal intervals.  No ectopy.  LABS:  BMP Latest Ref Rng & Units 08/06/2019 08/05/2019 08/03/2019  Glucose 70 - 99 mg/dL 104(H) 102(H) 110(H)  BUN 8 - 23 mg/dL _1 Creatinine 0.61 - 1.24 mg/dL 1.06 1.07 1.09  Sodium 135 - 145 mmol/L 139 139 141  Potassium 3.5 - 5.1 mmol/L 3.5 3.6 3.7  Chloride 98 - 111 mmol/L 106 108 107  CO2 22 - 32 mmol/L _2 Calcium 8.9 - 10.3 mg/dL 8.8(L) 8.6(L) 9.2     Hepatic Function Latest Ref Rng & Units 08/02/2019 08/01/2019 10/11/2013  Total Protein 6.5 - 8.1 g/dL 6.0(L) 7.5 7.3  Albumin 3.5 - 5.0 g/dL 3.4(L) 4.5 4.5  AST 15 - 41 U/L 38 37 23  ALT 0 - 44 U/L 31 39 32  Alk Phosphatase 38 - 126 U/L 60 70 85  Total Bilirubin 0.3 - 1.2 mg/dL 1.5(H) 1.5(H) 0.8  Bilirubin, Direct 0.0 - 0.3 mg/dL - - 0.1     CBC Latest Ref Rng & Units 08/06/2019 08/05/2019 08/03/2019  WBC 4.0 - 10.5 K/uL 6.4 7.4 6.9  Hemoglobin 13.0 - 17.0 g/dL 15.0 14.8 15.1  Hematocrit 39 - 52 % 45.8 44.7 47.0  Platelets 150 - 400 K/uL 142(L) 137(L) 144(L)     Lipid Panel     Component Value Date/Time   CHOL 221 (H) 10/11/2013 1022   TRIG 103 08/02/2019 0511   HDL 55.10 10/11/2013 1022   CHOLHDL 4 10/11/2013 1022   VLDL 11.8 10/11/2013 1022  LDLDIRECT 157.6 10/11/2013 1022     RADIOLOGY: No results found.  IMPRESSION:  1. Obstructive apnea   2. Pure hypercholesterolemia   3. Prediabetes   4. 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  He initiated CPAP therapy at that time but when I saw him in 2017 he was not compliant.  At that time I spent considerable time and discussed the importance of treating his sleep apnea particularly with reference to his cardiovascular risk.  He was sleeping in recliner and with his 25 pound weight loss felt he did not need it as much.  We discussed ultimately transitioning to a new CPAP machine since his machine was old.  However over the past several years he never really resumed using CPAP.  He states he is sleeping well.  He denies any significant daytime sleepiness.  He is not snoring.  I reviewed recent laboratory which was done at the [facility]  He has a history of prediabetes.  Most recent hemoglobin A1c was 5.7.  Lipid studies were increased with total cholesterol 224 and LDL cholesterol 146.  Reportedly he has a history of myalgias secondary to statin.  I have recommended initiation of Zetia 10 mg which should hopefully induce a 20% reduction.  If levels are still high he may be a candidate for combination bempedoic acid and Zetia as an alternative to statin therapy.  I would recommend follow-up laboratory in 3 months after initiation of therapy.  He is mildly obese with a BMI of 31.66.  Weight loss and increased exercise was recommended.  He is status post surgeries on his hip and back.  As long as he remains stable I will see him in 1 year for evaluation or sooner as needed.   Troy Sine, MD, [facility]  02/23/2020 3:08 PM

## 2020-02-21 NOTE — Patient Instructions (Signed)
Medication Instructions:  BEGIN TAKING ZETIA 10 MG DAILY *If you need a refill on your cardiac medications before your next appointment, please call your pharmacy*   Follow-Up: At [facility], you and your health needs are our priority.  As part of our continuing mission to provide you with exceptional heart care, we have created designated Provider Care Teams.  These Care Teams include your primary Cardiologist (physician) and Advanced Practice Providers (APPs -  Physician Assistants and Nurse Practitioners) who all work together to provide you with the care you need, when you need it.  We recommend signing up for the patient portal called "MyChart".  Sign up information is provided on this After Visit Summary.  MyChart is used to connect with patients for Virtual Visits (Telemedicine).  Patients are able to view lab/test results, encounter notes, upcoming appointments, etc.  Non-urgent messages can be sent to your provider as well.   To learn more about what you can do with MyChart, go to [email or web address].    Your next appointment:   12 month(s)  The format for your next appointment:   In Person  Provider:   Shelva Majestic, MD

## 2020-02-23 ENCOUNTER — Encounter: Payer: Self-pay | Admitting: Cardiovascular Disease

## 2021-01-26 ENCOUNTER — Other Ambulatory Visit (HOSPITAL_COMMUNITY): Payer: Self-pay | Admitting: Urology

## 2021-01-26 ENCOUNTER — Other Ambulatory Visit: Payer: Self-pay | Admitting: Urology

## 2021-01-26 DIAGNOSIS — N401 Enlarged prostate with lower urinary tract symptoms: Secondary | ICD-10-CM

## 2021-01-26 DIAGNOSIS — R338 Other retention of urine: Secondary | ICD-10-CM

## 2021-02-07 ENCOUNTER — Ambulatory Visit (HOSPITAL_COMMUNITY)
Admission: RE | Admit: 2021-02-07 | Discharge: 2021-02-07 | Disposition: A | Discharge status: Home / Self Care | Payer: Medicare PPO | Source: Ambulatory Visit | Attending: Urology | Admitting: Urology

## 2021-02-07 ENCOUNTER — Other Ambulatory Visit: Payer: Self-pay

## 2021-02-07 DIAGNOSIS — N401 Enlarged prostate with lower urinary tract symptoms: Secondary | ICD-10-CM

## 2021-02-07 DIAGNOSIS — R338 Other retention of urine: Secondary | ICD-10-CM | POA: Diagnosis present

## 2021-02-07 MED ORDER — GADOBUTROL 1 MMOL/ML IV SOLN
10.0000 mL | Freq: Once | INTRAVENOUS | Status: AC | PRN
  Administered 2021-02-07: 10 mL via INTRAVENOUS

## 2021-02-11 ENCOUNTER — Other Ambulatory Visit: Payer: Self-pay | Admitting: Cardiovascular Disease

## 2021-02-14 ENCOUNTER — Other Ambulatory Visit: Payer: Self-pay | Admitting: Urology

## 2021-03-09 NOTE — Patient Instructions (Addendum)
DUE TO COVID-19 ONLY ONE VISITOR IS ALLOWED TO COME WITH YOU AND STAY IN THE WAITING ROOM ONLY DURING PRE OP AND PROCEDURE DAY OF SURGERY. THE 2 VISITORS  MAY VISIT WITH YOU AFTER SURGERY IN YOUR PRIVATE ROOM DURING VISITING HOURS ONLY!  YOU NEED TO HAVE A COVID 19 TEST ON_8/3______ @_______ , THIS TEST MUST BE DONE BEFORE SURGERY,  COVID TESTING SITE  is at [place] [address]     Your procedure is scheduled on: 03/30/21   Report to [facility] Main  Entrance   Report to admitting at   6:15 AM     Call this number if you have problems the morning of surgery [phone]    Remember: Do not eat food or drink liquids :After Midnight.   BRUSH YOUR TEETH MORNING OF SURGERY AND RINSE YOUR MOUTH OUT, NO CHEWING GUM CANDY OR MINTS.     Take these medicines the morning of surgery with A SIP OF WATER: none                                You may not have any metal on your body including              piercings  Do not wear jewelry, lotions, powders or deodorant                          Men may shave face and neck.   Do not bring valuables to the hospital. [place].  Contacts, dentures or bridgework may not be worn into surgery.      Special Instructions: N/A              Please read over the following fact sheets you were given: _____________________________________________________________________   [facility] - Preparing for Surgery Before surgery, you can play an important role.  Because skin is not sterile, your skin needs to be as free of germs as possible.  You can reduce the number of germs on your skin by washing with CHG (chlorahexidine gluconate) soap before surgery.  CHG is an antiseptic cleaner which kills germs and bonds with the skin to continue killing germs even after washing. Please DO NOT use if you have an allergy to CHG or antibacterial soaps.  If your skin becomes  reddened/irritated stop using the CHG and inform your nurse when you arrive at Short Stay.  You may shave your face/neck.  Please follow these instructions carefully:  1.  Shower with CHG Soap the night before surgery and the  morning of Surgery.  2.  If you choose to wash your hair, wash your hair first as usual with your  normal  shampoo.  3.  After you shampoo, rinse your hair and body thoroughly to remove the  shampoo.                                       4.  Use CHG as you would any other liquid soap.  You can apply chg directly  to the skin and wash  Gently with a scrungie or clean washcloth.  5.  Apply the CHG Soap to your body ONLY FROM THE NECK DOWN.   Do not use on face/ open                           Wound or open sores. Avoid contact with eyes, ears mouth and genitals (private parts).                       Wash face,  Genitals (private parts) with your normal soap.             6.  Wash thoroughly, paying special attention to the area where your surgery  will be performed.  7.  Thoroughly rinse your body with warm water from the neck down.  8.  DO NOT shower/wash with your normal soap after using and rinsing off  the CHG Soap.             9.  Pat yourself dry with a clean towel.            10.  Wear clean pajamas.            11.  Place clean sheets on your bed the night of your first shower and do not  sleep with pets. Day of Surgery : Do not apply any lotions/deodorants the morning of surgery.  Please wear clean clothes to the hospital/surgery center.  FAILURE TO FOLLOW THESE INSTRUCTIONS MAY RESULT IN THE CANCELLATION OF YOUR SURGERY PATIENT SIGNATURE_________________________________  NURSE SIGNATURE__________________________________  ________________________________________________________________________

## 2021-03-12 ENCOUNTER — Other Ambulatory Visit: Payer: Self-pay

## 2021-03-12 ENCOUNTER — Encounter (HOSPITAL_COMMUNITY)
Admission: RE | Admit: 2021-03-12 | Discharge: 2021-03-12 | Disposition: A | Discharge status: Home / Self Care | Payer: Medicare PPO | Source: Ambulatory Visit | Attending: Urology | Admitting: Urology

## 2021-03-12 ENCOUNTER — Encounter (HOSPITAL_COMMUNITY): Payer: Self-pay

## 2021-03-12 DIAGNOSIS — Z01818 Encounter for other preprocedural examination: Secondary | ICD-10-CM | POA: Insufficient documentation

## 2021-03-12 LAB — HEMOGLOBIN A1C
Hgb A1c MFr Bld: 6 % — ABNORMAL HIGH (ref 4.8–5.6)
Mean Plasma Glucose: 125.5 mg/dL

## 2021-03-12 LAB — CBC
HCT: 48.5 % (ref 39.0–52.0)
Hemoglobin: 15.9 g/dL (ref 13.0–17.0)
MCH: 29.2 pg (ref 26.0–34.0)
MCHC: 32.8 g/dL (ref 30.0–36.0)
MCV: 89.2 fL (ref 80.0–100.0)
Platelets: 167 10*3/uL (ref 150–400)
RBC: 5.44 MIL/uL (ref 4.22–5.81)
RDW: 13.4 % (ref 11.5–15.5)
WBC: 5.4 10*3/uL (ref 4.0–10.5)
nRBC: 0 % (ref 0.0–0.2)

## 2021-03-12 LAB — BASIC METABOLIC PANEL
Anion gap: 13 (ref 5–15)
BUN: 17 mg/dL (ref 8–23)
CO2: 23 mmol/L (ref 22–32)
Calcium: 9.9 mg/dL (ref 8.9–10.3)
Chloride: 105 mmol/L (ref 98–111)
Creatinine, Ser: 1.29 mg/dL — ABNORMAL HIGH (ref 0.61–1.24)
GFR, Estimated: 59 mL/min — ABNORMAL LOW (ref >60)
Glucose, Bld: 90 mg/dL (ref 70–99)
Potassium: 4.3 mmol/L (ref 3.5–5.1)
Sodium: 141 mmol/L (ref 135–145)

## 2021-03-12 NOTE — Progress Notes (Signed)
COVID Vaccine Completed:Yes Date COVID Vaccine completed:10/28/19-boosters 07/13/20, 02/16/21 COVID vaccine manufacturer:  Moderna    PCP - [facility] in [place] 02/16/21 Cardiologist - Dr/ Corky Downs cardio/sleep LOV 02/21/20  Chest x-ray - no EKG - 03/12/21-chart Stress Test - no ECHO - no Cardiac Cath - no Pacemaker/ICD device last checked:NA  Sleep Study - yes Pt reports that he lost 15 lbs and sleeps much better now CPAP - no  Fasting Blood Sugar - NA Checks Blood Sugar _____ times a day  Blood Thinner Instructions:NA Aspirin Instructions: Last Dose:  Anesthesia review: no  Patient denies shortness of breath, fever, cough and chest pain at PAT appointment Pt reports that he walks everyday but hasn't been able to because of his foley. He reports no SOB with any activities.  Patient verbalized understanding of instructions that were given to them at the PAT appointment. Patient was also instructed that they will need to review over the PAT instructions again at home before surgery. Yes

## 2021-03-28 ENCOUNTER — Other Ambulatory Visit: Payer: Self-pay

## 2021-03-29 LAB — SARS CORONAVIRUS 2 (TAT 6-24 HRS): SARS Coronavirus 2: NEGATIVE

## 2021-03-29 NOTE — Anesthesia Preprocedure Evaluation (Addendum)
Anesthesia Evaluation  Patient identified by MRN, date of birth, ID band Patient awake    Reviewed: Allergy & Precautions, H&P , NPO status , Patient's Chart, lab work & pertinent test results  History of Anesthesia Complications (+) PONV and history of anesthetic complications  Airway Mallampati: III  TM Distance: >3 FB Neck ROM: Full    Dental no notable dental hx. (+) Teeth Intact, Dental Advisory Given   Pulmonary sleep apnea , former smoker,    Pulmonary exam normal breath sounds clear to auscultation       Cardiovascular negative cardio ROS   Rhythm:Regular Rate:Normal     Neuro/Psych Anxiety negative neurological ROS     GI/Hepatic Neg liver ROS, GERD  Medicated and Controlled,  Endo/Other  negative endocrine ROS  Renal/GU negative Renal ROS  negative genitourinary   Musculoskeletal  (+) Arthritis , Osteoarthritis,    Abdominal   Peds  Hematology negative hematology ROS (+)   Anesthesia Other Findings   Reproductive/Obstetrics negative OB ROS                             Anesthesia Physical  Anesthesia Plan  ASA: 3  Anesthesia Plan: General   Post-op Pain Management:    Induction: Intravenous  PONV Risk Score and Plan: 4 or greater and Ondansetron, Dexamethasone and Midazolam  Airway Management Planned: Oral ETT  Additional Equipment: None  Intra-op Plan:   Post-operative Plan: Extubation in OR  Informed Consent: I have reviewed the patients History and Physical, chart, labs and discussed the procedure including the risks, benefits and alternatives for the proposed anesthesia with the patient or authorized representative who has indicated his/her understanding and acceptance.     Dental advisory given  Plan Discussed with: CRNA and Anesthesiologist  Anesthesia Plan Comments:        Anesthesia Quick Evaluation

## 2021-03-30 ENCOUNTER — Ambulatory Visit (HOSPITAL_COMMUNITY): Payer: Medicare PPO | Admitting: Anesthesiology

## 2021-03-30 ENCOUNTER — Encounter (HOSPITAL_COMMUNITY)
Admission: RE | Disposition: A | Discharge status: Home / Self Care | Payer: Self-pay | Source: Ambulatory Visit | Attending: Urology

## 2021-03-30 ENCOUNTER — Encounter (HOSPITAL_COMMUNITY): Payer: Self-pay | Admitting: Urology

## 2021-03-30 ENCOUNTER — Observation Stay (HOSPITAL_COMMUNITY)
Admission: RE | Admit: 2021-03-30 | Discharge: 2021-03-31 | Disposition: A | Discharge status: Home / Self Care | Payer: Medicare PPO | Source: Ambulatory Visit | Attending: Urology | Admitting: Urology

## 2021-03-30 DIAGNOSIS — N401 Enlarged prostate with lower urinary tract symptoms: Secondary | ICD-10-CM | POA: Diagnosis present

## 2021-03-30 DIAGNOSIS — Z87891 Personal history of nicotine dependence: Secondary | ICD-10-CM | POA: Insufficient documentation

## 2021-03-30 DIAGNOSIS — Z96641 Presence of right artificial hip joint: Secondary | ICD-10-CM | POA: Diagnosis not present

## 2021-03-30 DIAGNOSIS — N312 Flaccid neuropathic bladder, not elsewhere classified: Secondary | ICD-10-CM | POA: Diagnosis not present

## 2021-03-30 DIAGNOSIS — C61 Malignant neoplasm of prostate: Secondary | ICD-10-CM | POA: Diagnosis not present

## 2021-03-30 DIAGNOSIS — N3289 Other specified disorders of bladder: Secondary | ICD-10-CM | POA: Insufficient documentation

## 2021-03-30 DIAGNOSIS — N400 Enlarged prostate without lower urinary tract symptoms: Secondary | ICD-10-CM | POA: Diagnosis present

## 2021-03-30 DIAGNOSIS — Z7982 Long term (current) use of aspirin: Secondary | ICD-10-CM | POA: Insufficient documentation

## 2021-03-30 LAB — HEMOGLOBIN AND HEMATOCRIT, BLOOD
HCT: 46.7 % (ref 39.0–52.0)
Hemoglobin: 15.3 g/dL (ref 13.0–17.0)

## 2021-03-30 SURGERY — Holmium Laser Enucleation of the Prostate with Morcellation
Anesthesia: General | Site: Prostate

## 2021-03-30 MED ORDER — FENTANYL CITRATE (PF) 100 MCG/2ML IJ SOLN
INTRAMUSCULAR | Status: AC
  Filled 2021-03-30: qty 2

## 2021-03-30 MED ORDER — PROPOFOL 10 MG/ML IV BOLUS
INTRAVENOUS | Status: AC
  Filled 2021-03-30: qty 20

## 2021-03-30 MED ORDER — DOCUSATE SODIUM 100 MG PO CAPS
100.0000 mg | ORAL_CAPSULE | Freq: Two times a day (BID) | ORAL | Status: DC
  Administered 2021-03-30 – 2021-03-31 (×2): 100 mg via ORAL
  Filled 2021-03-30 (×2): qty 1

## 2021-03-30 MED ORDER — BACITRACIN-NEOMYCIN-POLYMYXIN 400-5-5000 EX OINT: 1.0000 "application " | TOPICAL_OINTMENT | Freq: Three times a day (TID) | CUTANEOUS | Status: DC | PRN

## 2021-03-30 MED ORDER — SODIUM CHLORIDE 0.9 % IR SOLN
3000.0000 mL | Status: DC
  Administered 2021-03-31 (×2): 3000 mL

## 2021-03-30 MED ORDER — DEXAMETHASONE SODIUM PHOSPHATE 10 MG/ML IJ SOLN
INTRAMUSCULAR | Status: DC | PRN
  Administered 2021-03-30: 10 mg via INTRAVENOUS

## 2021-03-30 MED ORDER — FENTANYL CITRATE (PF) 100 MCG/2ML IJ SOLN
INTRAMUSCULAR | Status: DC | PRN
  Administered 2021-03-30: 25 ug via INTRAVENOUS
  Administered 2021-03-30: 100 ug via INTRAVENOUS
  Administered 2021-03-30 (×3): 50 ug via INTRAVENOUS
  Administered 2021-03-30: 25 ug via INTRAVENOUS
  Administered 2021-03-30 (×2): 50 ug via INTRAVENOUS

## 2021-03-30 MED ORDER — ONDANSETRON HCL 4 MG/2ML IJ SOLN
INTRAMUSCULAR | Status: DC | PRN
  Administered 2021-03-30: 4 mg via INTRAVENOUS

## 2021-03-30 MED ORDER — ROCURONIUM BROMIDE 10 MG/ML (PF) SYRINGE
PREFILLED_SYRINGE | INTRAVENOUS | Status: DC | PRN
  Administered 2021-03-30 (×2): 10 mg via INTRAVENOUS
  Administered 2021-03-30: 70 mg via INTRAVENOUS

## 2021-03-30 MED ORDER — SUGAMMADEX SODIUM 200 MG/2ML IV SOLN
INTRAVENOUS | Status: DC | PRN
  Administered 2021-03-30: 200 mg via INTRAVENOUS

## 2021-03-30 MED ORDER — DEXTROSE-NACL 5-0.45 % IV SOLN: INTRAVENOUS | Status: DC

## 2021-03-30 MED ORDER — ONDANSETRON HCL 4 MG/2ML IJ SOLN
INTRAMUSCULAR | Status: AC
  Filled 2021-03-30: qty 2

## 2021-03-30 MED ORDER — TAMSULOSIN HCL 0.4 MG PO CAPS
0.8000 mg | ORAL_CAPSULE | Freq: Every day | ORAL | Status: DC
  Administered 2021-03-30: 0.8 mg via ORAL
  Filled 2021-03-30: qty 2

## 2021-03-30 MED ORDER — ONDANSETRON HCL 4 MG/2ML IJ SOLN: 4.0000 mg | INTRAMUSCULAR | Status: DC | PRN

## 2021-03-30 MED ORDER — LIDOCAINE 2% (20 MG/ML) 5 ML SYRINGE
INTRAMUSCULAR | Status: AC
  Filled 2021-03-30: qty 5

## 2021-03-30 MED ORDER — MEPERIDINE HCL 50 MG/ML IJ SOLN: 6.2500 mg | INTRAMUSCULAR | Status: DC | PRN

## 2021-03-30 MED ORDER — CEFAZOLIN SODIUM-DEXTROSE 1-4 GM/50ML-% IV SOLN
1.0000 g | Freq: Three times a day (TID) | INTRAVENOUS | Status: AC
  Administered 2021-03-30 – 2021-03-31 (×2): 1 g via INTRAVENOUS
  Filled 2021-03-30 (×3): qty 50

## 2021-03-30 MED ORDER — PROPOFOL 10 MG/ML IV BOLUS
INTRAVENOUS | Status: DC | PRN
  Administered 2021-03-30: 110 mg via INTRAVENOUS

## 2021-03-30 MED ORDER — CHLORHEXIDINE GLUCONATE CLOTH 2 % EX PADS
6.0000 | MEDICATED_PAD | Freq: Every day | CUTANEOUS | Status: DC
  Administered 2021-03-31 (×2): 6 via TOPICAL

## 2021-03-30 MED ORDER — OXYCODONE HCL 5 MG/5ML PO SOLN
5.0000 mg | Freq: Once | ORAL | Status: DC | PRN
Start: 2021-03-30 — End: 2021-03-30

## 2021-03-30 MED ORDER — LACTATED RINGERS IV SOLN: INTRAVENOUS | Status: DC

## 2021-03-30 MED ORDER — EPHEDRINE SULFATE-NACL 50-0.9 MG/10ML-% IV SOSY
PREFILLED_SYRINGE | INTRAVENOUS | Status: DC | PRN
  Administered 2021-03-30 (×2): 5 mg via INTRAVENOUS

## 2021-03-30 MED ORDER — LIDOCAINE 2% (20 MG/ML) 5 ML SYRINGE
INTRAMUSCULAR | Status: DC | PRN
  Administered 2021-03-30: 100 mg via INTRAVENOUS

## 2021-03-30 MED ORDER — ACETAMINOPHEN 325 MG PO TABS: 325.0000 mg | ORAL_TABLET | ORAL | Status: DC | PRN

## 2021-03-30 MED ORDER — MIDAZOLAM HCL 2 MG/2ML IJ SOLN
INTRAMUSCULAR | Status: AC
  Filled 2021-03-30: qty 2

## 2021-03-30 MED ORDER — DOCUSATE SODIUM 100 MG PO CAPS: 100.0000 mg | ORAL_CAPSULE | Freq: Every day | ORAL | 0 refills | Status: DC | PRN

## 2021-03-30 MED ORDER — MIDAZOLAM HCL 2 MG/2ML IJ SOLN
INTRAMUSCULAR | Status: DC | PRN
  Administered 2021-03-30: 2 mg via INTRAVENOUS

## 2021-03-30 MED ORDER — ACETAMINOPHEN 500 MG PO TABS
1000.0000 mg | ORAL_TABLET | Freq: Four times a day (QID) | ORAL | Status: AC
  Administered 2021-03-30 – 2021-03-31 (×4): 1000 mg via ORAL
  Filled 2021-03-30 (×4): qty 2

## 2021-03-30 MED ORDER — OXYBUTYNIN CHLORIDE 5 MG PO TABS: 5.0000 mg | ORAL_TABLET | Freq: Three times a day (TID) | ORAL | Status: DC | PRN

## 2021-03-30 MED ORDER — PANTOPRAZOLE SODIUM 40 MG PO TBEC
40.0000 mg | DELAYED_RELEASE_TABLET | Freq: Every day | ORAL | Status: DC
  Administered 2021-03-31: 40 mg via ORAL
  Filled 2021-03-30: qty 1

## 2021-03-30 MED ORDER — PHENYLEPHRINE 40 MCG/ML (10ML) SYRINGE FOR IV PUSH (FOR BLOOD PRESSURE SUPPORT)
PREFILLED_SYRINGE | INTRAVENOUS | Status: AC
  Filled 2021-03-30: qty 10

## 2021-03-30 MED ORDER — CHLORHEXIDINE GLUCONATE 0.12 % MT SOLN
15.0000 mL | Freq: Once | OROMUCOSAL | Status: AC
  Administered 2021-03-30: 15 mL via OROMUCOSAL

## 2021-03-30 MED ORDER — SODIUM CHLORIDE 0.9 % IR SOLN
Status: DC | PRN
  Administered 2021-03-30 (×22): 3000 mL

## 2021-03-30 MED ORDER — FENTANYL CITRATE (PF) 100 MCG/2ML IJ SOLN: 25.0000 ug | INTRAMUSCULAR | Status: DC | PRN

## 2021-03-30 MED ORDER — ORAL CARE MOUTH RINSE: 15.0000 mL | Freq: Once | OROMUCOSAL | Status: AC

## 2021-03-30 MED ORDER — ROCURONIUM BROMIDE 10 MG/ML (PF) SYRINGE
PREFILLED_SYRINGE | INTRAVENOUS | Status: AC
  Filled 2021-03-30: qty 10

## 2021-03-30 MED ORDER — OXYCODONE-ACETAMINOPHEN 5-325 MG PO TABS: 1.0000 | ORAL_TABLET | ORAL | 0 refills | Status: DC | PRN

## 2021-03-30 MED ORDER — OXYCODONE HCL 5 MG PO TABS: 5.0000 mg | ORAL_TABLET | Freq: Once | ORAL | Status: DC | PRN

## 2021-03-30 MED ORDER — ONDANSETRON HCL 4 MG/2ML IJ SOLN: 4.0000 mg | Freq: Once | INTRAMUSCULAR | Status: DC | PRN

## 2021-03-30 MED ORDER — DEXAMETHASONE SODIUM PHOSPHATE 10 MG/ML IJ SOLN
INTRAMUSCULAR | Status: AC
  Filled 2021-03-30: qty 1

## 2021-03-30 MED ORDER — TRAMADOL HCL 50 MG PO TABS: 50.0000 mg | ORAL_TABLET | Freq: Four times a day (QID) | ORAL | Status: DC | PRN

## 2021-03-30 MED ORDER — PHENYLEPHRINE 40 MCG/ML (10ML) SYRINGE FOR IV PUSH (FOR BLOOD PRESSURE SUPPORT)
PREFILLED_SYRINGE | INTRAVENOUS | Status: DC | PRN
  Administered 2021-03-30 (×5): 80 ug via INTRAVENOUS

## 2021-03-30 MED ORDER — CEFAZOLIN SODIUM-DEXTROSE 2-4 GM/100ML-% IV SOLN
2.0000 g | Freq: Once | INTRAVENOUS | Status: AC
  Administered 2021-03-30: 2 g via INTRAVENOUS
  Filled 2021-03-30: qty 100

## 2021-03-30 MED ORDER — ACETAMINOPHEN 160 MG/5ML PO SOLN: 325.0000 mg | ORAL | Status: DC | PRN

## 2021-03-30 SURGICAL SUPPLY — 44 items
ADAPTER IRRIG TUBE 2 SPIKE SOL (ADAPTER) ×4 IMPLANT
ADPR TBG 2 SPK PMP STRL ASCP (ADAPTER) ×2
BAG DRN LRG CPC RND TRDRP CNTR (MISCELLANEOUS) ×1
BAG URO CATCHER STRL LF (MISCELLANEOUS) ×2 IMPLANT
BAG URO DRAIN 4000ML (MISCELLANEOUS) ×2 IMPLANT
BAND INSRT 18 STRL LF DISP RB (MISCELLANEOUS) ×1
BAND RUBBER #18 3X1/16 STRL (MISCELLANEOUS) ×2 IMPLANT
CATH FOLEY 3WAY 30CC 22FR (CATHETERS) ×1 IMPLANT
CATH FOLEY 3WAY 30CC 24FR (CATHETERS) ×2
CATH INTERMIT  6FR 70CM (CATHETERS) ×1 IMPLANT
CATH URET 5FR 28IN OPEN ENDED (CATHETERS) ×2 IMPLANT
CATH URETL 5X70 OPEN END (CATHETERS) ×1 IMPLANT
CATH URTH STD 24FR FL 3W 2 (CATHETERS) ×1 IMPLANT
CONTAINER COLLECT MORCELLATR (MISCELLANEOUS) ×1 IMPLANT
DRAPE UTILITY 15X26 TOWEL STRL (DRAPES) IMPLANT
ELECT BIVAP BIPO 22/24 DONUT (ELECTROSURGICAL)
ELECTRD BIVAP BIPO 22/24 DONUT (ELECTROSURGICAL) IMPLANT
FIBER LASER MOSES 550 DFL (Laser) ×2 IMPLANT
FILTER OVERFLOW MORCELLATOR (FILTER) ×1 IMPLANT
GLOVE SURG ENC TEXT LTX SZ7 (GLOVE) ×2 IMPLANT
GOWN STRL REUS W/TWL LRG LVL3 (GOWN DISPOSABLE) ×2 IMPLANT
GUIDEWIRE STR DUAL SENSOR (WIRE) ×1 IMPLANT
HOLDER FOLEY CATH W/STRAP (MISCELLANEOUS) ×2 IMPLANT
IV NS IRRIG 3000ML ARTHROMATIC (IV SOLUTION) ×17 IMPLANT
KIT TURNOVER KIT A (KITS) ×2 IMPLANT
LASER FIBER SIDE FIRE SU 550UM (MISCELLANEOUS) ×1 IMPLANT
LOOP CUT BIPOLAR 24F LRG (ELECTROSURGICAL) ×4 IMPLANT
MBRN O SEALING YLW 17 FOR INST (MISCELLANEOUS) ×2
MEMBRANE SLNG YLW 17 FOR INST (MISCELLANEOUS) ×1 IMPLANT
MORCELLATOR COLLECT CONTAINER (MISCELLANEOUS) ×2
MORCELLATOR OVERFLOW FILTER (FILTER) ×2
MORCELLATOR ROTATION 4.75 335 (MISCELLANEOUS) ×2 IMPLANT
PACK CYSTO (CUSTOM PROCEDURE TRAY) ×2 IMPLANT
PIN SAFETY STERILE (MISCELLANEOUS) ×2 IMPLANT
SET IRRIG Y TYPE TUR BLADDER L (SET/KITS/TRAYS/PACK) IMPLANT
SLEEVE SURGEON STRL (DRAPES) ×4 IMPLANT
SYR 30ML LL (SYRINGE) ×2 IMPLANT
SYR TOOMEY IRRIG 70ML (MISCELLANEOUS) ×2
SYRINGE TOOMEY IRRIG 70ML (MISCELLANEOUS) ×1 IMPLANT
TUBE PUMP MORCELLATOR PIRANHA (TUBING) ×2 IMPLANT
TUBING CONNECTING 10 (TUBING) ×2 IMPLANT
TUBING UROLOGY SET (TUBING) ×2 IMPLANT
WATER STERILE IRR 1000ML POUR (IV SOLUTION) ×2 IMPLANT
WATER STERILE IRR 500ML POUR (IV SOLUTION) ×2 IMPLANT

## 2021-03-30 NOTE — Discharge Instructions (Signed)

## 2021-03-30 NOTE — Anesthesia Procedure Notes (Signed)
Procedure Name: Intubation Date/Time: 03/30/2021 8:22 AM Performed by: Sharlette Dense, CRNA Pre-anesthesia Checklist: Patient identified, Emergency Drugs available, Suction available and Patient being monitored Patient Re-evaluated:Patient Re-evaluated prior to induction Oxygen Delivery Method: Circle system utilized Preoxygenation: Pre-oxygenation with 100% oxygen Induction Type: IV induction Ventilation: Mask ventilation without difficulty and Oral airway inserted - appropriate to patient size Laryngoscope Size: Miller and 3 Grade View: Grade II Tube type: Oral Tube size: 7.5 mm Number of attempts: 2 Airway Equipment and Method: Stylet and Oral airway Placement Confirmation: ETT inserted through vocal cords under direct vision, positive ETCO2 and breath sounds checked- equal and bilateral Secured at: 23 cm Tube secured with: Tape Dental Injury: Teeth and Oropharynx as per pre-operative assessment  Difficulty Due To: Difficult Airway- due to anterior larynx and Difficult Airway- due to reduced neck mobility

## 2021-03-30 NOTE — Op Note (Addendum)
Operative Note  Preoperative diagnosis:  1.  Bladder outlet obstruction. 2.  Benign prostatic hyperplasia. 3.  Urinary retention  Postoperative diagnosis: 1.  Bladder outlet obstruction. 2.  Benign prostatic hyperplasia. 3.  Urinary retention  Procedure(s): 1.  Transurethral resection of prostate  Surgeon: Rexene Alberts, MD  Resident:  Bishop Limbo, PGY-4  Anesthesia:  General  Complications:  None  EBL: 29m  Specimens: 1. Prostate chips ID Type Source Tests Collected by Time Destination  1 : prostate chips Tissue PATH Prostate TURP SURGICAL PATHOLOGY GJanith Lima MD 03/30/2021 1121     Drains/Catheters: 1.  22-French 3-way Foley catheter with 569min the balloon.  Intraoperative findings:   1.  No bladder lesions. 2.  Ureteral orifices in orthotopic position. 3.  Plus +2 trabeculation. Elongated prostatic urethra with very high bladder neck and trilobar obstructing prostate. 3. Attempted HoLEP however, met immediate bleeding near apex. I was able to enucleate the median lobe; however, had significant bleeding that would not stop with the laser, so I transitioned to a TURP. 4. Excellent hemostasis at the end of the case.  Indication:  WiCAYLE THUNDERs a 89.o. male with a history of bladder outlet obstruction and benign prostatic hyperplasia.  Risks, benefits and alternatives were explained and the patient decided to proceed.  Description of procedure: The indications, alternatives, benefits and risks were discussed with the patient and informed consent was obtained.  The patient was brought onto the operating room table, positioned supine and secured with a safety strap.  Pneumatic compression devices were placed on the lower extremities.  After administration of intravenous antibiotics and general anesthesia, the patient was repositioned in the dorsal lithotomy position and all pressure points were carefully padded.  The genitalia were prepped and draped in the  standard sterile manner.  Timeout was completed, verifying the correct patient, surgical procedure, and positioning prior to beginning the procedure.  Isotonic normal saline was used for irrigation.  The patient's urethra was calibrated to 30 Fr[place]ith sequential Va[place]ens-[place]ounds.  Next, a 2624rench continuous-flow resectoscope was inserted into the patient's bladder using the visual obturator.  This was then exchanged for the laser bridge.  On cystoscopic evaluation, there were no tumors, stones or foreign bodies or diverticula present.  The bladder wall appeared trabeculated. He had a large capacity bladder. He had a very high bladder neck that we had to torque in order to bypass. He had obstructing trilobar prostate. Both orifices were in the normal anatomic position with clear urinary reflux noted bilaterally.  The location of the ureteral orifices and prostatic configuration was again confirmed.  Using the 550 m holmium Moses laser fiber, we first made an incision starting above the verumontanum taking this down to the capsule.  We then made an incision starting at the bladder neck at 5 and 7 o'clock, taking these down to the level of the verumontanum.  We then enucleated the median lobe in a retrograde fashion. Of note, this was difficult given the persistent hematuria and bleeding with each incision that would not coagulate effectively with the laser. I then elected to transition to a TURP.  The obturator was removed and replaced by the working element with a resection loop.  The location of the ureteral orifices and the prostatic configuration were again confirmed.  Starting at the bladder neck and proceeding distally to the verumontanum a transurethral section of the prostate was performed using bipolar using energy of 4 and 5 for cutting  and coagulation, respectively. Next the left lateral lobe was resected to the level of the transverse capsular fibers.  The identical procedure was performed on  the right lobe.  Attention was then directed anteriorly and the resection was completed from the 10 o'clock to 2 o'clock positions. We were able to create a nice open prostatic fossa.  All bleeding vessels were fulgurated achieving meticulous hemostasis.  The bladder was irrigated with a Toomey syringe, ensuring removal of all prostate chips which were sent to pathology for evaluation. We ended up looping through the median lobe enucleated piece in the prostatic fossa with the resectoscope loop.  Having completed the resection and the chips removed, we again confirmed hemostasis with the loop with coagulating current.  Upon completion of the entire procedure, the bladder and posterior urethra were reexamined, confirming open prostatic urethra and bladder neck without evidence of bleeding or perforation.  Both ureteral orifices and the external sphincter were noted to be intact.  A sensor wire was placed into the bladder and the resectoscope was withdrawn under direct vision and a 22 French three-way Foley catheter with a 30 cc balloon was inserted into the bladder over the wire with an angiocath.  The balloon was inflated with 50 cc of sterile water. After multiple manual irrigations ensuring clear return of the irrigant, the procedure was terminated.  The catheter was attached to a drainage bag and continuous bladder irrigation was started with normal saline.  The patient was positioned supine.  At the end of the procedure, all counts were correct.  Patient tolerated the procedure well and was taken to the recovery room satisfactory condition.  Plan: Continuous bladder irrigation overnight with gentle Foley traction.  Plan to discharge home tomorrow with Foley catheter in place and void trial in the office in 3 days. He has 17m in his balloon.  Matt R. [facility]rology  Pager: [phone]

## 2021-03-30 NOTE — Anesthesia Postprocedure Evaluation (Signed)
Anesthesia Post Note  Patient: James Gates  Procedure(s) Performed: Holmium Laser Enucleation of the Prostate   TRANSURETHRAL RESECTION OF PROSTATE (Prostate)     Patient location during evaluation: PACU Anesthesia Type: General Level of consciousness: awake and alert Pain management: pain level controlled Vital Signs Assessment: post-procedure vital signs reviewed and stable Respiratory status: spontaneous breathing, nonlabored ventilation, respiratory function stable and patient connected to nasal cannula oxygen Cardiovascular status: blood pressure returned to baseline and stable Postop Assessment: no apparent nausea or vomiting Anesthetic complications: no   No notable events documented.  Last Vitals:  Vitals:   03/30/21 1330 03/30/21 1350  BP: 124/70 127/69  Pulse: 91 91  Resp: 17 18  Temp:  37.1 C  SpO2: 94% 99%    Last Pain:  Vitals:   03/30/21 1330  TempSrc:   PainSc: 0-No pain                 Yuriy Cui

## 2021-03-30 NOTE — H&P (Signed)
--------------------------------------------------------------------------------   CC/HPI: James Gates is a 70 year old male seen in follow-up with a history of BPH with LUTS and urinary retention.   He has a long history of a chronically distended bladder. CT scan 08/2017 revealed a distended bladder however he was voiding without difficulty with a normal creatinine. He was initiated on tamsulosin 0.8 mg daily however this was stopped. In 08/2019, he was found to have a PVR of 0 and Foley catheter was removed.   He did not see a urologist in over a year. I saw him in 12/2020 with PVR of over 1 L. He said he was voiding without difficulty however given elevated PVR, Foley catheter was placed. At the time, his IPSS score was 8, quality-of-life 3. He does report sensation of incomplete bladder emptying, frequency, urgency, 1 time nocturia. Urodynamics was not on 01/16/2021 that demonstrated max capacity of around 1 L with for sensation of 554 mL. He was unable to generate a voluntary contraction and void. He did have some increased EMG activity during voiding. When reviewing his detrusor pressure, his max peak pressure was 28 cm H2O with mean pressure of 10 cm H2O.   He has a family history of prostate cancer in 3 of his brothers and he has a worried about this. His last PSA on 04/26/2019 was normal at 1.55. His PSA on 12/29/2020 was normal at 2.5. He has never had a TRUS biopsy. He denies taking 5 alpha reductase inhibitors. He is requesting further evaluation given his history of prostate cancer in his family.   Patient currently denies fever, chills, sweats, nausea, vomiting, abdominal or flank pain, gross hematuria or dysuria.     ALLERGIES: No Allergies    MEDICATIONS: Tamsulosin Hcl 0.4 mg capsule 2 capsule PO Q HS  Aspir-81 81 MG Oral Tablet Delayed Release Oral  Ezetimibe  Flexeril 5 mg tablet Oral  Hydrocodone-Acetaminophen 2.5 mg-325 mg tablet Oral  Sulfamethoxazole-Trimethoprim 800-160 MG  Oral Tablet 0 Oral  Tamsulosin HCl - 0.4 MG Oral Capsule 2 capsule PO Daily     GU PSH: Complex cystometrogram, w/ void pressure and urethral pressure profile studies, any technique - 01/16/2021 Complex Uroflow - 01/16/2021 Cystoscopy - 2019 Emg surf Electrd - 01/16/2021 Intrabd voidng Press - 01/16/2021 Locm 300-'399Mg'$ /Ml Iodine,1Ml - 2018       PSH Notes: Hip Replacement Right, Back Surgery   NON-GU PSH: None   GU PMH: Urinary Retention - 01/16/2021, - 12/29/2020, He does not have an elevated residual today. Multiple PVR scans resulted in showing an empty bladder. I do find this somewhat hard to believe given his past history but with no worsening symptomatology I do not think going ahead with a more formal bladder ultrasound is necessary at this time., - 09/09/2019 (Chronic), - 08/10/2019, He has an enlarged, obstructing prostate. He is currently taking tamsulosin and I have recommended he increase the dosage to 0.8 mg. I will then continue to monitor his creatinine which remains normal., - 2019, Acute urinary retention, - 2017 BPH w/LUTS - 12/29/2020, Incomplete emptying of bladder due to benign prostatic hyperplasia, - 2017 Encounter for Prostate Cancer screening (Stable) - 12/29/2020, Prostate cancer screening, - 2017 Gross hematuria (Stable), I found no abnormality on cystoscopy today and he had no abnormality of his upper tract by CT scan. It appears his hematuria may have been from his prostate but I can find no other abnormality. - 2019, He has developed gross hematuria and he did have some right flank pain. With a  brother who has had kidney stones I told him that a kidney stone could potentially cause this however he has been a smoker in the past and I have recommended upper and lower tract evaluation., - 2018 BPH w/o LUTS (Stable), He is emptying his bladder now. His creatinine remained stable. His PSA was rechecked and found to be normal and he remains on tamsulosin. I have recommended yearly  DRE and PSA and we'll have him return to see me again in 12 months. - 2017 Urinary Tract Inf, Unspec site, Bacteriuria with pyuria - 2017 Elevated PSA, Elevated PSA - 2017 Other Disorder Prostate, Asymmetric prostate - 2017      PMH Notes: Gross hematuria: He experienced an episode of gross hematuria in 1/19 at which time a CT scan and cystoscopy were performed and found to be negative.   Urinary retention: He was found to have a distended bladder on CT scan in 1/19 but was still voiding without difficulty and had a normal creatinine.  Treatment: Tamsulosin 0.8 mg.   NON-GU PMH: Encounter for general adult medical examination without abnormal findings, Encounter for preventive health examination - 2017 Personal history of other diseases of the nervous system and sense organs, History of sleep apnea - 2014 Personal history of other endocrine, nutritional and metabolic disease, History of hypercholesterolemia - 2014 Spinal stenosis, lumbar region, Lumbar Canal Stenosis - 2014    FAMILY HISTORY: Benign Prostatic Hypertrophy - Brother Family Health Status Number - Runs In Family Lung Cancer - Father nephrolithiasis - Brother   SOCIAL HISTORY: Marital Status: Married Preferred Language: English; Ethnicity: Not Hispanic Or Latino; Race: Black or African American     Notes: Former smoker, Alcohol Use, Previous History Of Smoking, Caffeine Use, Marital History - Currently Married   REVIEW OF SYSTEMS:    GU Review Male:   Patient denies frequent urination, hard to postpone urination, burning/ pain with urination, get up at night to urinate, leakage of urine, stream starts and stops, trouble starting your stream, have to strain to urinate , erection problems, and penile pain.  Gastrointestinal (Upper):   Patient denies nausea, vomiting, and indigestion/ heartburn.  Gastrointestinal (Lower):   Patient denies diarrhea and constipation.  Constitutional:   Patient denies fever, night sweats, weight  loss, and fatigue.  Skin:   Patient denies skin rash/ lesion and itching.  Eyes:   Patient denies blurred vision and double vision.  Ears/ Nose/ Throat:   Patient denies sore throat and sinus problems.  Hematologic/Lymphatic:   Patient denies swollen glands and easy bruising.  Cardiovascular:   Patient denies leg swelling and chest pains.  Respiratory:   Patient denies cough and shortness of breath.  Endocrine:   Patient denies excessive thirst.  Musculoskeletal:   Patient denies back pain and joint pain.  Neurological:   Patient denies headaches and dizziness.  Psychologic:   Patient denies depression and anxiety.   VITAL SIGNS: None   MULTI-SYSTEM PHYSICAL EXAMINATION:    Constitutional: Well-nourished. No physical deformities. Normally developed. Good grooming.  Respiratory: No labored breathing, no use of accessory muscles.   Cardiovascular: Normal temperature, normal extremity pulses, no swelling, no varicosities.  Gastrointestinal: No mass, no tenderness, no rigidity, non obese abdomen.     Complexity of Data:  Source Of History:  Patient, Medical Record Summary  Lab Test Review:   PSA  Records Review:   AUA Symptom Score, Previous Doctor Records  Urine Test Review:   Urinalysis  Urodynamics Review:   Review Bladder Scan,  Review Urodynamics Tests   12/29/20 04/26/19 04/02/16 01/10/16  PSA  Total PSA 2.50 ng/mL 1.55 ng/mL 1.51  5.14   Free PSA    0.43   % Free PSA    8     PROCEDURES:          Urinalysis w/Scope Dipstick Dipstick Cont'd Micro  Color: Yellow Bilirubin: Neg mg/dL WBC/hpf: 6 - 10/hpf  Appearance: Cloudy Ketones: Neg mg/dL RBC/hpf: 0 - 2/hpf  Specific Gravity: 1.025 Blood: Neg ery/uL Bacteria: Many (>50/hpf)  pH: 6.0 Protein: 1+ mg/dL Cystals: NS (Not Seen)  Glucose: Neg mg/dL Urobilinogen: 0.2 mg/dL Casts: NS (Not Seen)    Nitrites: Positive Trichomonas: Not Present    Leukocyte Esterase: 3+ leu/uL Mucous: Not Present      Epithelial Cells: NS (Not  Seen)      Yeast: NS (Not Seen)      Sperm: Not Present    ASSESSMENT:      ICD-10 Details  1 GU:   BPH w/LUTS - N40.1   2   Encounter for Prostate Cancer screening - Z12.5   3   Urinary Retention - R33.8   4   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 Contrast. No Oral Contrast          Schedule Return Visit/Planned Activity: 3 Weeks - Nurse Visit             Note: Foley exchange          Document Letter(s):  Created for Patient: Clinical Summary         Notes:   #1. BPH with LUTS:  -I reviewed urodynamics with him today with indication of 1 L capacity, hypotonic bladder with max detrusor pressure 28 and average pressure 10 cm H2O. We did discuss that bladder outlet obstruction procedure could be possible however may not leave him catheter free. For further evaluation, I recommend imaging to obtain size of his prostate. As he is concerned for prostate cancer given his extensive prostate cancer family history (including 1 brother he was diagnosed with metastatic disease with a low PSA), we will arrange for MRI of the prostate. I will plan to call him with results. If evidence of this lesion, we will proceed with MRI fusion biopsy. If no evidence of lesion, we will schedule bladder outlet obstruction procedure.  -20 Foley to gravity for now. Follow-up in 3 weeks with nurse for exchange.  -UCx, will rx if positive.   #2. Incomplete bladder emptying: A Foley to gravity for now. Follow-up in 3 weeks with nurse for exchange.   3. Prostate cancer screening: DRE 60 g, no nodules. PSA on 12/29/2020 was normal at 2.5. Given extensive family history of prostate cancer including developing metastatic prostate cancer one of his brothers with a low PSA, he is requesting further evaluation. We will proceed with prostate MRI. I will plan to call him with results and potentially schedule MRI fusion biopsy if these were  found to be suspicious.   -MRI prostate demonstrated no suspicious prostate lesions on 6/16 with prostate size 82cm^3  CC: Precious Haws, MD        Next Appointment:      Next Appointment: 02/13/2021 10:00 AM    Appointment Type: Nurse Visit NO Urine    Location: [facility] [phone]    Provider: NVPodC NVPodC  Reason for Visit: 3 wk foley change      Signed by Rexene Alberts, M.D. on 01/25/21 at 3:18 PM (EDT)  Urology Preoperative H&P   Chief Complaint: BPH  History of Present Illness: James Gates is a 70 y.o. male with BPH here for HoLEP, possible TURP.    Past Medical History:  Diagnosis Date   Arthritis    BPH (benign prostatic hypertrophy) with urinary retention    Colon polyps    GERD (gastroesophageal reflux disease)    History of BPH    Hyperlipidemia    Numbness and tingling of both legs    PONV (postoperative nausea and vomiting)    Pre-diabetes    PTSD (post-traumatic stress disorder)    [facility]; being consulted at the [facility] in [place].    Sleep apnea    cpap   Sleep apnea, obstructive 2011   sleep study done @ [facility]; does not wear CPAP at this moment   Spinal stenosis of lumbar region    Wears partial dentures     Past Surgical History:  Procedure Laterality Date   COLONOSCOPY     COLONOSCOPY W/ POLYPECTOMY     CYST EXCISION     on back   LUMBAR LAMINECTOMY/DECOMPRESSION MICRODISCECTOMY N/A 12/20/2015   Procedure: Lumbar two-Lumbar five Decompressive Laminectomy;  Surgeon: Jovita Gamma, MD;  Location: [facility];  Service: Neurosurgery;  Laterality: N/A;   LUMBAR LAMINECTOMY/DECOMPRESSION MICRODISCECTOMY Right 02/16/2018   Procedure: LUMBAR ONE-TWO LAMINOTOMY AND MICRODISCECTOMY;  Surgeon: Jovita Gamma, MD;  Location: [facility];  Service: Neurosurgery;  Laterality: Right;  LUMBAR ONE-TWO LAMINOTOMY AND MICRODISCECTOMY   POLYPECTOMY     TOTAL HIP ARTHROPLASTY Right 04/24/2015   Procedure: TOTAL HIP ARTHROPLASTY  ANTERIOR APPROACH;  Surgeon: Frederik Pear, MD;  Location: [facility];  Service: Orthopedics;  Laterality: Right;    Allergies: No Known Allergies  Family History  Problem Relation Age of Onset   Lung cancer Father    Ulcers Other    Colon cancer Neg Hx     Social History:  reports that he quit smoking about 39 years ago. His smoking use included cigarettes. He has a 15.00 pack-year smoking history. He has been exposed to tobacco smoke. He has never used smokeless tobacco. He reports current alcohol use of about 6.0 standard drinks of alcohol per week. He reports that he does not use drugs.  ROS: A complete review of systems was performed.  All systems are negative except for pertinent findings as noted.  Physical Exam:  Vital signs in last 24 hours: Temp:  [98.3 F (36.8 C)] 98.3 F (36.8 C) (08/05 0635) Pulse Rate:  [87] 87 (08/05 0635) Resp:  [18] 18 (08/05 0635) BP: (134)/(77) 134/77 (08/05 0635) SpO2:  [98 %] 98 % (08/05 0635) Weight:  [96.9 kg] 96.9 kg (08/05 0651) Constitutional:  Alert and oriented, No acute distress Cardiovascular: Regular rate and rhythm Respiratory: Normal respiratory effort, Lungs clear bilaterally GI: Abdomen is soft, nontender, nondistended, no abdominal masses GU: No CVA tenderness Lymphatic: No lymphadenopathy Neurologic: Grossly intact, no focal deficits Psychiatric: Normal mood and affect  Laboratory Data:  No results for input(s): WBC, HGB, HCT, PLT in the last 72 hours.  No results for input(s): NA, K, CL, GLUCOSE, BUN, CALCIUM, CREATININE in the last 72 hours.  Invalid input(s): CO3   No results found for this or any previous visit (from the past 24 hour(s)). Recent Results (from the past 240 hour(s))  SARS Coronavirus 2 (TAT 6-24 hrs)  Status: None   Collection Time: 03/28/21 12:00 AM  Result Value Ref Range Status   SARS Coronavirus 2 RESULT: NEGATIVE  Corrected    Comment: RESULT: NEGATIVESARS-CoV-2 INTERPRETATION:A NEGATIVE  test  result means that SARS-CoV-2 RNA was not present in the specimen above the limit of detection of this test. This does not preclude a possible SARS-CoV-2 infection and should not be used as the  sole basis for patient management decisions. Negative results must be combined with clinical observations, patient history, and epidemiological information. Optimum specimen types and timing for peak viral levels during infections caused by SARS-CoV-2  have not been determined. Collection of multiple specimens or types of specimens may be necessary to detect virus. Improper specimen collection and handling, sequence variability under primers/probes, or organism present below the limit of detection may  lead to false negative results. Positive and negative predictive values of testing are highly dependent on prevalence. False negative test results are more likely when prevalence of disease is high.The expected result is NEGATIVE.Fact S heet for  Healthcare Providers: LocalChronicle.no Sheet for Patients: SalonLookup.es Reference Range - Negative     Renal Function: No results for input(s): CREATININE in the last 168 hours. Estimated Creatinine Clearance: 60.3 mL/min (A) (by C-G formula based on SCr of 1.29 mg/dL (H)).  Radiologic Imaging: No results found.  I independently reviewed the above imaging studies.  Assessment and Plan MARQUAN HAGIN is a 70 y.o. male with BPH here for HoLEP, possible TURP.    Risks and benefits of Holmium Laser Enucleation of the Prostate were reviewed in detail including infection, bleeding, blood transfusion, injury to bladder/urethra/surrounding structures, erectile dysfunction, urinary incontinence, bladder neck contracture, persistent obstructive and irritative voiding symptoms, and global anesthesia risks including but not limited to CVA, MI, DVT, PE, pneumonia, and death.  He expressed understanding and desire  to proceed.    Matt R. Taesha Goodell MD 03/30/2021, 7:32 AM  [facility] Pager: [phone]): [phone]

## 2021-03-30 NOTE — Transfer of Care (Signed)
Immediate Anesthesia Transfer of Care Note  Patient: James Gates  Procedure(s) Performed: Holmium Laser Enucleation of the Prostate   TRANSURETHRAL RESECTION OF PROSTATE (Prostate)  Patient Location: PACU  Anesthesia Type:General  Level of Consciousness: awake and alert   Airway & Oxygen Therapy: Patient Spontanous Breathing and Patient connected to face mask oxygen  Post-op Assessment: Report given to RN and Post -op Vital signs reviewed and stable  Post vital signs: Reviewed and stable  Last Vitals:  Vitals Value Taken Time  BP 137/89 03/30/21 1143  Temp    Pulse 102 03/30/21 1143  Resp 18 03/30/21 1143  SpO2 100 % 03/30/21 1143  Vitals shown include unvalidated device data.  Last Pain:  Vitals:   03/30/21 0651  TempSrc:   PainSc: 0-No pain      Patients Stated Pain Goal: 3 ([id number])  Complications: No notable events documented.

## 2021-03-31 ENCOUNTER — Other Ambulatory Visit: Payer: Self-pay

## 2021-03-31 DIAGNOSIS — C61 Malignant neoplasm of prostate: Secondary | ICD-10-CM | POA: Diagnosis not present

## 2021-03-31 LAB — HEMOGLOBIN AND HEMATOCRIT, BLOOD
HCT: 42 % (ref 39.0–52.0)
Hemoglobin: 13.6 g/dL (ref 13.0–17.0)

## 2021-03-31 LAB — BASIC METABOLIC PANEL
Anion gap: 5 (ref 5–15)
BUN: 12 mg/dL (ref 8–23)
CO2: 25 mmol/L (ref 22–32)
Calcium: 8.7 mg/dL — ABNORMAL LOW (ref 8.9–10.3)
Chloride: 106 mmol/L (ref 98–111)
Creatinine, Ser: 1.07 mg/dL (ref 0.61–1.24)
GFR, Estimated: >60 mL/min (ref >60)
Glucose, Bld: 157 mg/dL — ABNORMAL HIGH (ref 70–99)
Potassium: 4 mmol/L (ref 3.5–5.1)
Sodium: 136 mmol/L (ref 135–145)

## 2021-03-31 NOTE — Progress Notes (Signed)
Discharge education complete. Pt educated on how to care for his foley catheter at home. All questions answered. IV removed. Pt discharged in stable condition.

## 2021-04-03 LAB — SURGICAL PATHOLOGY

## 2021-04-22 NOTE — Discharge Summary (Signed)
Physician Discharge Summary  Patient ID: James Gates MRN: [id number] DOB/AGE: 71-Jun-1952 71 y.o.  Admit date: 03/30/2021 Discharge date: 03/31/2021  Admission Diagnoses:  BPH  Discharge Diagnoses:  Active Problems:   BPH (benign prostatic hyperplasia)   Past Medical History:  Diagnosis Date   Arthritis    BPH (benign prostatic hypertrophy) with urinary retention    Colon polyps    GERD (gastroesophageal reflux disease)    History of BPH    Hyperlipidemia    Numbness and tingling of both legs    PONV (postoperative nausea and vomiting)    Pre-diabetes    PTSD (post-traumatic stress disorder)    Veteren; being consulted at the [facility] in [place].    Sleep apnea    cpap   Sleep apnea, obstructive 2011   sleep study done @ [facility]; does not wear CPAP at this moment   Spinal stenosis of lumbar region    Wears partial dentures     Surgeries: Procedure(s): Holmium Laser Enucleation of the Prostate   TRANSURETHRAL RESECTION OF PROSTATE on 03/30/2021   Consultants (if any): Treatment Team:  Lucas Mallow, MD  Discharged Condition: Improved  Hospital Course: James Gates is an 70 y.o. male who was admitted 03/30/2021 with a diagnosis of <principal problem not specified> and went to the operating room on 03/30/2021 and underwent the above named procedures.    He was given perioperative antibiotics:  Anti-infectives (From admission, onward)    Start     Dose/Rate Route Frequency Ordered Stop   03/30/21 1600  ceFAZolin (ANCEF) IVPB 1 g/50 mL premix        1 g 100 mL/hr over 30 Minutes Intravenous Every 8 hours 03/30/21 1400 03/31/21 0036   03/30/21 0630  ceFAZolin (ANCEF) IVPB 2g/100 mL premix        2 g 200 mL/hr over 30 Minutes Intravenous  Once 03/30/21 0626 03/30/21 0835     .  He was given sequential compression devices, early ambulation for DVT prophylaxis.  He benefited maximally from the hospital stay and there were no complications.    Recent vital  signs:  Vitals:   03/31/21 0632 03/31/21 1119  BP: 107/70 108/64  Pulse: 75 73  Resp: 18 20  Temp: 98.1 F (36.7 C) 99.1 F (37.3 C)  SpO2: 100% 100%    Recent laboratory studies:  Lab Results  Component Value Date   HGB 13.6 03/31/2021   HGB 15.3 03/30/2021   HGB 15.9 03/12/2021   Lab Results  Component Value Date   WBC 5.4 03/12/2021   PLT 167 03/12/2021   Lab Results  Component Value Date   INR 1.14 04/13/2015   Lab Results  Component Value Date   NA 136 03/31/2021   K 4.0 03/31/2021   CL 106 03/31/2021   CO2 25 03/31/2021   BUN 12 03/31/2021   CREATININE 1.07 03/31/2021   GLUCOSE 157 (H) 03/31/2021    Discharge Medications:   Allergies as of 03/31/2021   No Known Allergies      Medication List     STOP taking these medications    tamsulosin 0.4 MG Caps capsule Commonly known as: FLOMAX       TAKE these medications    docusate sodium 100 MG capsule Commonly known as: Colace Take 1 capsule (100 mg total) by mouth daily as needed for up to 30 doses.   ezetimibe 10 MG tablet Commonly known as: ZETIA Take 1 tablet (10 mg total) by  mouth daily. Patient need to schedule appointment for future refills   omeprazole 10 MG capsule Commonly known as: PRILOSEC Take 10 mg by mouth.   oxyCODONE-acetaminophen 5-325 MG tablet Commonly known as: Percocet Take 1 tablet by mouth every 4 (four) hours as needed for up to 15 doses for severe pain.   TURMERIC PO Take 1 Scoop by mouth daily.        Diagnostic Studies: No results found.  Disposition: Discharge disposition: 01-Home or Self Care          Follow-up Information     [facility] Follow up on 04/03/2021.   Why: 8:15AM Contact information: [place] [phone]                 Signed: Nicolette Bang 04/22/2021, 5:35 PM

## 2021-05-11 ENCOUNTER — Other Ambulatory Visit: Payer: Self-pay | Admitting: Cardiovascular Disease

## 2021-06-05 ENCOUNTER — Other Ambulatory Visit: Payer: Self-pay | Admitting: Cardiovascular Disease

## 2021-06-30 ENCOUNTER — Other Ambulatory Visit: Payer: Self-pay | Admitting: Cardiovascular Disease

## 2021-07-24 ENCOUNTER — Other Ambulatory Visit: Payer: Self-pay | Admitting: Cardiovascular Disease

## 2021-08-25 ENCOUNTER — Other Ambulatory Visit: Payer: Self-pay | Admitting: Cardiovascular Disease

## 2021-09-22 ENCOUNTER — Other Ambulatory Visit: Payer: Self-pay | Admitting: Cardiovascular Disease

## 2021-09-24 ENCOUNTER — Encounter (HOSPITAL_COMMUNITY): Payer: Self-pay

## 2021-09-24 ENCOUNTER — Other Ambulatory Visit: Payer: Self-pay

## 2021-09-24 ENCOUNTER — Emergency Department (HOSPITAL_COMMUNITY): Payer: Medicare PPO

## 2021-09-24 ENCOUNTER — Emergency Department (HOSPITAL_COMMUNITY)
Admission: EM | Admit: 2021-09-24 | Discharge: 2021-09-24 | Disposition: A | Discharge status: Home / Self Care | Payer: Medicare PPO | Attending: Emergency Medicine | Admitting: Emergency Medicine

## 2021-09-24 DIAGNOSIS — Z79899 Other long term (current) drug therapy: Secondary | ICD-10-CM | POA: Diagnosis not present

## 2021-09-24 DIAGNOSIS — R0789 Other chest pain: Secondary | ICD-10-CM | POA: Insufficient documentation

## 2021-09-24 DIAGNOSIS — R079 Chest pain, unspecified: Secondary | ICD-10-CM

## 2021-09-24 LAB — COMPREHENSIVE METABOLIC PANEL
ALT: 30 U/L (ref 0–44)
AST: 25 U/L (ref 15–41)
Albumin: 4.5 g/dL (ref 3.5–5.0)
Alkaline Phosphatase: 77 U/L (ref 38–126)
Anion gap: 7 (ref 5–15)
BUN: 17 mg/dL (ref 8–23)
CO2: 26 mmol/L (ref 22–32)
Calcium: 9.3 mg/dL (ref 8.9–10.3)
Chloride: 104 mmol/L (ref 98–111)
Creatinine, Ser: 1.18 mg/dL (ref 0.61–1.24)
GFR, Estimated: >60 mL/min (ref >60)
Glucose, Bld: 130 mg/dL — ABNORMAL HIGH (ref 70–99)
Potassium: 4.4 mmol/L (ref 3.5–5.1)
Sodium: 137 mmol/L (ref 135–145)
Total Bilirubin: 0.8 mg/dL (ref 0.3–1.2)
Total Protein: 7.9 g/dL (ref 6.5–8.1)

## 2021-09-24 LAB — CBC WITH DIFFERENTIAL/PLATELET
Abs Immature Granulocytes: 0.01 10*3/uL (ref 0.00–0.07)
Basophils Absolute: 0 10*3/uL (ref 0.0–0.1)
Basophils Relative: 1 %
Eosinophils Absolute: 0 10*3/uL (ref 0.0–0.5)
Eosinophils Relative: 1 %
HCT: 47.3 % (ref 39.0–52.0)
Hemoglobin: 15.8 g/dL (ref 13.0–17.0)
Immature Granulocytes: 0 %
Lymphocytes Relative: 20 %
Lymphs Abs: 0.9 10*3/uL (ref 0.7–4.0)
MCH: 29.4 pg (ref 26.0–34.0)
MCHC: 33.4 g/dL (ref 30.0–36.0)
MCV: 88.1 fL (ref 80.0–100.0)
Monocytes Absolute: 0.4 10*3/uL (ref 0.1–1.0)
Monocytes Relative: 8 %
Neutro Abs: 3.3 10*3/uL (ref 1.7–7.7)
Neutrophils Relative %: 70 %
Platelets: 159 10*3/uL (ref 150–400)
RBC: 5.37 MIL/uL (ref 4.22–5.81)
RDW: 13.7 % (ref 11.5–15.5)
WBC: 4.8 10*3/uL (ref 4.0–10.5)
nRBC: 0 % (ref 0.0–0.2)

## 2021-09-24 LAB — TROPONIN I (HIGH SENSITIVITY)
Troponin I (High Sensitivity): 2 ng/L (ref <18)
Troponin I (High Sensitivity): <2 ng/L (ref <18)

## 2021-09-24 LAB — D-DIMER, QUANTITATIVE: D-Dimer, Quant: <0.27 ug/mL-FEU (ref 0.00–0.50)

## 2021-09-24 MED ORDER — ALUM & MAG HYDROXIDE-SIMETH 200-200-20 MG/5ML PO SUSP
30.0000 mL | Freq: Once | ORAL | Status: AC
  Administered 2021-09-24: 30 mL via ORAL
  Filled 2021-09-24: qty 30

## 2021-09-24 NOTE — Discharge Instructions (Signed)
Try pepcid or tagamet up to twice a day.  Try to avoid things that may make this worse, most commonly these are spicy foods tomato based products fatty foods chocolate and peppermint.  Alcohol and tobacco can also make this worse.  Return to the emergency department for sudden worsening pain fever or inability to eat or drink.  

## 2021-09-24 NOTE — ED Triage Notes (Signed)
Pt c/o chest and left arm pain for 2 weeks. Patient states the pain progressively became dull over the weeks. Patient has not taking anything for the pain and also states that his chest is sore to the touch.  Patient has a hx of vertigo.

## 2021-09-24 NOTE — ED Provider Notes (Signed)
[facility] DEPT Provider Note   CSN: [id number] Arrival date & time: 09/24/21  0277     History  No chief complaint on file.   James Gates is a 71 y.o. male.  71 yo M with a chief complaints of chest discomfort.  This is left-sided and described as an annoyance.  Seems to be pretty constant.  Going on for the past couple weeks.  He thought maybe he had slept on it wrong.  Usually worse first thing in the morning.  He also gets radiating down into his left arm.  Not exertional.  Has had a little bit of a cough.  No fevers no nausea vomiting or diarrhea.  No abdominal discomfort.  Denies trauma to the chest.  Patient denies history of MI, denies hypertension, diabetes or smoking.  Denies family history of MI. Hx of HLD  Patient denies history of PE or DVT denies hemoptysis denies unilateral lower extremity edema denies recent surgery immobilization hospitalization estrogen use. Has hx of prostate ca, not currently requiring any therapy.         Home Medications Prior to Admission medications   Medication Sig Start Date End Date Taking? Authorizing Provider  acetaminophen (TYLENOL) 500 MG tablet Take 1,000 mg by mouth every 6 (six) hours as needed for mild pain.   Yes [provider]  cholecalciferol (VITAMIN D3) 25 MCG (1000 UNIT) tablet Take 1,000 Units by mouth 3 (three) times a week.   Yes [provider]  omeprazole (PRILOSEC) 20 MG capsule Take 20 mg by mouth daily.   Yes [provider]  TURMERIC PO Take 1 Scoop by mouth daily.   Yes [provider]  ezetimibe (ZETIA) 10 MG tablet Take 1 tablet (10 mg total) by mouth daily. NEEDS APPOINTMENT WITH DR Claiborne Billings FOR FUTURE REFILLS, 3rd attempt 09/24/21   Troy Sine, MD      Allergies    Patient has no known allergies.    Review of Systems   Review of Systems  Physical Exam Updated Vital Signs BP 133/85    Pulse 75    Temp 98.8 F (37.1 C) (Oral)     Resp (!) 23    Ht 5\' 9"  (1.753 m)    Wt 96.2 kg    SpO2 98%    BMI 31.31 kg/m  Physical Exam Vitals and nursing note reviewed.  Constitutional:      Appearance: He is well-developed.  HENT:     Head: Normocephalic and atraumatic.  Eyes:     Pupils: Pupils are equal, round, and reactive to light.  Neck:     Vascular: No JVD.  Cardiovascular:     Rate and Rhythm: Normal rate and regular rhythm.     Heart sounds: No murmur heard.   No friction rub. No gallop.  Pulmonary:     Effort: No respiratory distress.     Breath sounds: No wheezing.  Abdominal:     General: There is no distension.     Tenderness: There is no abdominal tenderness. There is no guarding or rebound.  Musculoskeletal:        General: Normal range of motion.     Cervical back: Normal range of motion and neck supple.     Comments: No obvious chest wall pain on exam.  Full ROM of the left shoulder without obvious pain.  PMS intact distally.   Skin:    Coloration: Skin is not pale.     Findings:  No rash.  Neurological:     Mental Status: He is alert and oriented to person, place, and time.  Psychiatric:        Behavior: Behavior normal.    ED Results / Procedures / Treatments   Labs (all labs ordered are listed, but only abnormal results are displayed) Labs Reviewed  COMPREHENSIVE METABOLIC PANEL - Abnormal; Notable for the following components:      Result Value   Glucose, Bld 130 (*)    All other components within normal limits  CBC WITH DIFFERENTIAL/PLATELET  D-DIMER, QUANTITATIVE  TROPONIN I (HIGH SENSITIVITY)  TROPONIN I (HIGH SENSITIVITY)    EKG EKG Interpretation  Date/Time:  Monday September 24 2021 09:18:09 EST Ventricular Rate:  99 PR Interval:  162 QRS Duration: 78 QT Interval:  337 QTC Calculation: 433 R Axis:   58 Text Interpretation: Sinus rhythm Probable left atrial enlargement RSR' in V1 or V2, probably normal variant Artifact in lead(s) I III aVL TECHNICALLY DIFFICULT No significant  change since last tracing Confirmed by Deno Etienne [phone]) on 09/24/2021 9:43:44 AM  Radiology DG Chest Port 1 View  Result Date: 09/24/2021 CLINICAL DATA:  chest pain EXAM: PORTABLE CHEST 1 VIEW COMPARISON:  08/01/2019. FINDINGS: No consolidation. No visible pleural effusions or pneumothorax. Cardiomediastinal silhouette is within normal limits. IMPRESSION: No evidence of acute cardiopulmonary disease. Electronically Signed   By: Margaretha Sheffield M.D.   On: 09/24/2021 10:21    Procedures Procedures    Medications Ordered in ED Medications  alum & mag hydroxide-simeth (MAALOX/MYLANTA) 200-200-20 MG/5ML suspension 30 mL (30 mLs Oral Given 09/24/21 1275)    ED Course/ Medical Decision Making/ A&P                           Medical Decision Making Amount and/or Complexity of Data Reviewed Labs: ordered. Radiology: ordered. ECG/medicine tests: ordered.  Risk OTC drugs.   Patient is a 71 y.o. male with a cc of left-sided chest discomfort.  This is pinpoint nothing seems to make it better or worse.  Going on for a couple weeks now.  History of hyperlipidemia but no other obvious risk factors.  We will obtain lab work to evaluate for MI.  Chest x-ray.  2 troponins.  We will obtain a D-dimer with his reported history of prostate cancer and mild tachycardia here.  I reviewed the patient's medical record, his most recent visit with a urologist there is no diagnosis of prostate cancer instead it was diagnosed in his BPH.  I independently interpreted the patients labs and imaging no anemia.  No significant lecture light abnormality.  2 troponins negative.  Chest x-ray independently to read by me without fluctuance or pneumothorax.  D-dimer negative.  12:47 PM:  I have discussed the diagnosis/risks/treatment options with the patient.  Evaluation and diagnostic testing in the emergency department does not suggest an emergent condition requiring admission or immediate intervention beyond what has been  performed at this time.  They will follow up with  PCP. We also discussed returning to the ED immediately if new or worsening sx occur. We discussed the sx which are most concerning (e.g., sudden worsening pain, fever, inability to tolerate by mouth) that necessitate immediate return. Medications administered to the patient during their visit and any new prescriptions provided to the patient are listed below.  Medications given during this visit Medications  alum & mag hydroxide-simeth (MAALOX/MYLANTA) 200-200-20 MG/5ML suspension 30 mL (30 mLs Oral Given 09/24/21  0951)     The patient appears reasonably screen and/or stabilized for discharge and I doubt any other medical condition or other [facility] requiring further screening, evaluation, or treatment in the ED at this time prior to discharge.         Final Clinical Impression(s) / ED Diagnoses Final diagnoses:  Nonspecific chest pain    Rx / DC Orders ED Discharge Orders     None         Deno Etienne, DO 09/24/21 1247

## 2021-09-27 ENCOUNTER — Ambulatory Visit: Payer: Medicare PPO | Admitting: Cardiovascular Disease

## 2021-09-27 ENCOUNTER — Encounter: Payer: Self-pay | Admitting: Cardiovascular Disease

## 2021-09-27 ENCOUNTER — Other Ambulatory Visit: Payer: Self-pay

## 2021-09-27 VITALS — BP 136/74 | HR 96 | Ht 68.0 in | Wt 211.0 lb

## 2021-09-27 DIAGNOSIS — R072 Precordial pain: Secondary | ICD-10-CM

## 2021-09-27 DIAGNOSIS — G720 Drug-induced myopathy: Secondary | ICD-10-CM | POA: Diagnosis not present

## 2021-09-27 DIAGNOSIS — R7303 Prediabetes: Secondary | ICD-10-CM

## 2021-09-27 DIAGNOSIS — E7800 Pure hypercholesterolemia, unspecified: Secondary | ICD-10-CM

## 2021-09-27 DIAGNOSIS — I700 Atherosclerosis of aorta: Secondary | ICD-10-CM

## 2021-09-27 DIAGNOSIS — Z8669 Personal history of other diseases of the nervous system and sense organs: Secondary | ICD-10-CM

## 2021-09-27 DIAGNOSIS — E669 Obesity, unspecified: Secondary | ICD-10-CM

## 2021-09-27 NOTE — Patient Instructions (Signed)
Medication Instructions:  No changes *If you need a refill on your cardiac medications before your next appointment, please call your pharmacy*   Lab Work: None ordered If you have labs (blood work) drawn today and your tests are completely normal, you will receive your results only by: [place] (if you have MyChart) OR A paper copy in the mail If you have any lab test that is abnormal or we need to change your treatment, we will call you to review the results.   Testing/Procedures: None ordered   Follow-Up: At [facility], you and your health needs are our priority.  As part of our continuing mission to provide you with exceptional heart care, we have created designated Provider Care Teams.  These Care Teams include your primary Cardiologist (physician) and Advanced Practice Providers (APPs -  Physician Assistants and Nurse Practitioners) who all work together to provide you with the care you need, when you need it.  We recommend signing up for the patient portal called "MyChart".  Sign up information is provided on this After Visit Summary.  MyChart is used to connect with patients for Virtual Visits (Telemedicine).  Patients are able to view lab/test results, encounter notes, upcoming appointments, etc.  Non-urgent messages can be sent to your provider as well.   To learn more about what you can do with MyChart, go to [email or web address].    Your next appointment:   Follow up with Dr. Sallyanne Kuster in 3 months  Other Instructions [facility] the [facility] about having [facility] with them.

## 2021-09-27 NOTE — Progress Notes (Signed)
Cardiology Office Note:    Date:  09/28/2021   ID:  James Gates, DOB [birth year], MRN [id number]  PCP:  [facility]   [facility] Providers Cardiologist:  Sanda Klein, MD     Referring MD: [facility]   Chief Complaint  Patient presents with   Chest Pain  James Gates is a 71 y.o. male who is being seen today for the evaluation of chest pain at the request of [facility].   History of Present Illness:    James Gates is a 71 y.o. male former smoker with a hx of OSA, hypercholesterolemia and borderline diabetes mellitus who has had recent problems with atypical chest discomfort.  He mostly receives care at the [facility] in [place].  His mother-in-law is also my patient.  For the last 2 or 3 weeks he has had a sensation of "gas" in his left chest.  It is present constantly and does not appear to be worsened by physical activity or any particular other trigger.  It sometimes radiates into his left arm.  He notices that it is generally worse in the early hours of the morning and subsides throughout the day.  It has never been intense and it seems to be subsiding.  He still has a 1-09/2008 discomfort today.  He was seen in the emergency room on 09/24/2021 and had a normal electrocardiogram and normal cardiac biomarkers.  He denies palpitations, dizziness, syncope, shortness of breath at rest or with activity, lower extremity edema, claudication or focal neurological complaints.  He has not had cough, hemoptysis or unilateral calf tenderness or swelling.  He denies recent fever or chills.  He stopped smoking more than 20 years ago.  He has his lipids checked at the [facility] and was told that his numbers were "really good".  He does not have the actual values.  In 2015 his LDL was 157 (presumably not on treatment).  In Dr. Evette Georges note from March 2021 his LDL was 146.  He is taking ezetimibe and was apparently intolerant to statins due to "mild  myalgia".  His most recent hemoglobin A1c was 6.0% last summer.  He has normal renal function.  I reviewed a CT of the abdomen that he had performed a few years ago that incidentally shows evidence of mild aortic atherosclerosis and calcium in both iliac arteries.  There is also noticeable calcified plaque along the trajectory of the proximal LAD artery.   He denies daytime hypersomnolence.  He was seeing Dr. Claiborne Billings for his sleep apnea, but reports improvement after losing substantial weight.  He is no longer snoring.  He has a history of enlarged prostate and a strong family history of prostate cancer.  He has had lumbar spine surgery and total right hip replacement.  Past Medical History:  Diagnosis Date   Arthritis    BPH (benign prostatic hypertrophy) with urinary retention    Colon polyps    GERD (gastroesophageal reflux disease)    History of BPH    Hyperlipidemia    Numbness and tingling of both legs    PONV (postoperative nausea and vomiting)    Pre-diabetes    PTSD (post-traumatic stress disorder)    Veteren; being consulted at the [facility] in [place].    Sleep apnea    cpap   Sleep apnea, obstructive 2011   sleep study done @ [facility]; does not wear CPAP at this moment   Spinal stenosis of lumbar region  Wears partial dentures     Past Surgical History:  Procedure Laterality Date   COLONOSCOPY     COLONOSCOPY W/ POLYPECTOMY     CYST EXCISION     on back   LUMBAR LAMINECTOMY/DECOMPRESSION MICRODISCECTOMY N/A 12/20/2015   Procedure: Lumbar two-Lumbar five Decompressive Laminectomy;  Surgeon: Jovita Gamma, MD;  Location: [facility];  Service: Neurosurgery;  Laterality: N/A;   LUMBAR LAMINECTOMY/DECOMPRESSION MICRODISCECTOMY Right 02/16/2018   Procedure: LUMBAR ONE-TWO LAMINOTOMY AND MICRODISCECTOMY;  Surgeon: Jovita Gamma, MD;  Location: [facility];  Service: Neurosurgery;  Laterality: Right;  LUMBAR ONE-TWO LAMINOTOMY AND MICRODISCECTOMY   POLYPECTOMY     TOTAL HIP  ARTHROPLASTY Right 04/24/2015   Procedure: TOTAL HIP ARTHROPLASTY ANTERIOR APPROACH;  Surgeon: Frederik Pear, MD;  Location: [facility];  Service: Orthopedics;  Laterality: Right;    Current Medications: Current Meds  Medication Sig   acetaminophen (TYLENOL) 500 MG tablet Take 1,000 mg by mouth every 6 (six) hours as needed for mild pain.   cholecalciferol (VITAMIN D3) 25 MCG (1000 UNIT) tablet Take 1,000 Units by mouth 3 (three) times a week.   ezetimibe (ZETIA) 10 MG tablet Take 1 tablet (10 mg total) by mouth daily. NEEDS APPOINTMENT WITH DR Claiborne Billings FOR FUTURE REFILLS, 3rd attempt   omeprazole (PRILOSEC) 20 MG capsule Take 20 mg by mouth daily.   TURMERIC PO Take 1 Scoop by mouth daily.     Allergies:   Patient has no known allergies.   Social History   Socioeconomic History   Marital status: Married    Spouse name: Not on file   Number of children: Not on file   Years of education: Not on file   Highest education level: Not on file  Occupational History   Not on file  Tobacco Use   Smoking status: Former    Packs/day: 1.00    Years: 15.00    Pack years: 15.00    Types: Cigarettes    Quit date: 08/26/1981    Years since quitting: 40.1    Passive exposure: Past   Smokeless tobacco: Never  Vaping Use   Vaping Use: Never used  Substance and Sexual Activity   Alcohol use: Yes    Alcohol/week: 6.0 standard drinks    Types: 6 Standard drinks or equivalent per week   Drug use: No   Sexual activity: Yes  Other Topics Concern   Not on file  Social History Narrative   Not on file   Social Determinants of Health   Financial Resource Strain: Not on file  Food Insecurity: Not on file  Transportation Needs: Not on file  Physical Activity: Not on file  Stress: Not on file  Social Connections: Not on file     Family History: The patient's family history includes Lung cancer in his father; Ulcers in an other family member. There is no history of Colon cancer.  ROS:   Please see  the history of present illness.     All other systems reviewed and are negative.  EKGs/Labs/Other Studies Reviewed:    The following studies were reviewed today: CT abdomen 2020, notes from Dr. Claiborne Billings 2021, recent ED visit notes, labs, imaging studies  EKG:  EKG is not ordered today.  The ekg ordered 01 30 2023 demonstrates normal sinus rhythm, normal tracing  Recent Labs: 09/24/2021: ALT 30; BUN 17; Creatinine, Ser 1.18; Hemoglobin 15.8; Platelets 159; Potassium 4.4; Sodium 137  Recent Lipid Panel    Component Value Date/Time   CHOL 221 (H)  10/11/2013 1022   TRIG 103 08/02/2019 0511   HDL 55.10 10/11/2013 1022   CHOLHDL 4 10/11/2013 1022   VLDL 11.8 10/11/2013 1022   LDLDIRECT 157.6 10/11/2013 1022     Risk Assessment/Calculations:           Physical Exam:    VS:  BP 136/74    Pulse 96    Ht 5\' 8"  (1.727 m)    Wt 211 lb (95.7 kg)    SpO2 97%    BMI 32.08 kg/m     Wt Readings from Last 3 Encounters:  09/27/21 211 lb (95.7 kg)  09/24/21 212 lb (96.2 kg)  03/30/21 213 lb 9.8 oz (96.9 kg)     GEN: Mildly obese, well nourished, well developed in no acute distress HEENT: Normal NECK: No JVD; No carotid bruits LYMPHATICS: No lymphadenopathy CARDIAC: RRR, no murmurs, rubs, gallops RESPIRATORY:  Clear to auscultation without rales, wheezing or rhonchi  ABDOMEN: Soft, non-tender, non-distended MUSCULOSKELETAL:  No edema; No deformity  SKIN: Warm and dry NEUROLOGIC:  Alert and oriented x 3 PSYCHIATRIC:  Normal affect   ASSESSMENT:    1. Precordial pain   2. Atherosclerosis of aorta ([place])   3. Hypercholesterolemia   4. Statin myopathy   5. Prediabetes   6. History of sleep apnea   7. Mild obesity    PLAN:    In order of problems listed above:  Chest pain: This does not have the typical features of angina pectoris and he has a reassuring ECG and cardiac biomarkers during pain, but remains unexplained.  He does have some coronary risk factors and evidence of  coronary atherosclerosis by previous imaging studies.  I recommend a coronary CT angiogram.  He would like to see if that study can be performed through the [facility] hospital for cost concerns and I think this is reasonable.  He does not have any high risk features to suggest need for urgent imaging. Aortic and coronary atherosclerosis: Incidentally seen on imaging studies.  Hard to quantify on nondedicated imaging.  He does not have symptoms of peripheral arterial disease and the aorta was normal in caliber. HLP: Do not have his most recent lipid profile, but I am doubtful that treatment with Zetia would bring his LDL cholesterol down to target range (from roughly 150 at baseline).  Asked him to bring [place] a copy of his labs from the [facility].  If we identify significant coronary issues, target LDL would be 70 and he would be a candidate for PCSK9 inhibitors. Prediabetes: Fairly recent hemoglobin A1c was 6.0%.  It would be beneficial for him to lose more weight.  He remains in mildly obese range. Hx of OSA: Currently asymptomatic and no longer snores after weight loss.  He has stopped using CPAP.  He denies daytime hypersomnolence.           Medication Adjustments/Labs and Tests Ordered: Current medicines are reviewed at length with the patient today.  Concerns regarding medicines are outlined above.  No orders of the defined types were placed in this encounter.  No orders of the defined types were placed in this encounter.   Patient Instructions  Medication Instructions:  No changes *If you need a refill on your cardiac medications before your next appointment, please call your pharmacy*   Lab Work: None ordered If you have labs (blood work) drawn today and your tests are completely normal, you will receive your results only by: [place] (if you have MyChart) OR A paper  copy in the mail If you have any lab test that is abnormal or we need to change your treatment, we will call you to review  the results.   Testing/Procedures: None ordered   Follow-Up: At [facility], you and your health needs are our priority.  As part of our continuing mission to provide you with exceptional heart care, we have created designated Provider Care Teams.  These Care Teams include your primary Cardiologist (physician) and Advanced Practice Providers (APPs -  Physician Assistants and Nurse Practitioners) who all work together to provide you with the care you need, when you need it.  We recommend signing up for the patient portal called "MyChart".  Sign up information is provided on this After Visit Summary.  MyChart is used to connect with patients for Virtual Visits (Telemedicine).  Patients are able to view lab/test results, encounter notes, upcoming appointments, etc.  Non-urgent messages can be sent to your provider as well.   To learn more about what you can do with MyChart, go to [email or web address].    Your next appointment:   Follow up with Dr. Sallyanne Kuster in 3 months  Other Instructions [facility] with the [facility] about having [facility] with them.    Signed, Sanda Klein, MD  09/28/2021 9:51 AM    [facility]

## 2021-10-07 ENCOUNTER — Other Ambulatory Visit: Payer: Self-pay | Admitting: Cardiovascular Disease

## 2021-11-26 ENCOUNTER — Other Ambulatory Visit (HOSPITAL_COMMUNITY): Payer: Self-pay | Admitting: Internal Medicine

## 2021-11-26 DIAGNOSIS — R079 Chest pain, unspecified: Secondary | ICD-10-CM

## 2021-11-30 ENCOUNTER — Telehealth (HOSPITAL_COMMUNITY): Payer: Self-pay | Admitting: *Deleted

## 2021-11-30 MED ORDER — IVABRADINE HCL 7.5 MG PO TABS: ORAL_TABLET | ORAL | 0 refills | Status: AC

## 2021-11-30 MED ORDER — METOPROLOL TARTRATE 100 MG PO TABS: ORAL_TABLET | ORAL | 0 refills | Status: AC

## 2021-11-30 NOTE — Telephone Encounter (Signed)
Reaching out to patient to offer assistance regarding upcoming cardiac imaging study; pt verbalizes understanding of appt date/time, parking situation and where to check in, pre-test NPO status and medications ordered, and verified current allergies; name and call back number provided for further questions should they arise ? ?Gordy Clement RN Navigator Cardiac Imaging ?[facility] ?[phone] office ?[phone] cell ? ?Patient to take '100mg'$  metoprolol tartrate and '15mg'$  ivabradine two hours prior cardiac CT scan per chart review by Dr. Garen Lah. Pharmacy confirmed with patient and prescriptions sent. ?

## 2021-12-03 ENCOUNTER — Other Ambulatory Visit: Payer: Self-pay | Admitting: Internal Medicine

## 2021-12-03 ENCOUNTER — Ambulatory Visit
Admission: RE | Admit: 2021-12-03 | Discharge: 2021-12-03 | Disposition: A | Discharge status: Home / Self Care | Payer: No Typology Code available for payment source | Source: Ambulatory Visit | Attending: Internal Medicine | Admitting: Internal Medicine

## 2021-12-03 DIAGNOSIS — R931 Abnormal findings on diagnostic imaging of heart and coronary circulation: Secondary | ICD-10-CM

## 2021-12-03 DIAGNOSIS — R079 Chest pain, unspecified: Secondary | ICD-10-CM | POA: Insufficient documentation

## 2021-12-03 DIAGNOSIS — I2510 Atherosclerotic heart disease of native coronary artery without angina pectoris: Secondary | ICD-10-CM

## 2021-12-03 MED ORDER — NITROGLYCERIN 0.4 MG SL SUBL
0.8000 mg | SUBLINGUAL_TABLET | Freq: Once | SUBLINGUAL | Status: AC
  Administered 2021-12-03: 0.8 mg via SUBLINGUAL

## 2021-12-03 MED ORDER — IOHEXOL 350 MG/ML SOLN
100.0000 mL | Freq: Once | INTRAVENOUS | Status: AC | PRN
  Administered 2021-12-03: 100 mL via INTRAVENOUS

## 2021-12-03 MED ORDER — METOPROLOL TARTRATE 5 MG/5ML IV SOLN: 10.0000 mg | Freq: Once | INTRAVENOUS | Status: DC

## 2021-12-04 DIAGNOSIS — R931 Abnormal findings on diagnostic imaging of heart and coronary circulation: Secondary | ICD-10-CM

## 2022-01-05 ENCOUNTER — Other Ambulatory Visit: Payer: Self-pay | Admitting: Cardiovascular Disease

## 2022-01-10 ENCOUNTER — Ambulatory Visit (INDEPENDENT_AMBULATORY_CARE_PROVIDER_SITE_OTHER): Payer: Medicare PPO | Admitting: Cardiovascular Disease

## 2022-01-10 ENCOUNTER — Encounter: Payer: Self-pay | Admitting: Cardiovascular Disease

## 2022-01-10 VITALS — BP 142/74 | HR 82 | Ht 68.0 in | Wt 210.8 lb

## 2022-01-10 DIAGNOSIS — T466X5A Adverse effect of antihyperlipidemic and antiarteriosclerotic drugs, initial encounter: Secondary | ICD-10-CM

## 2022-01-10 DIAGNOSIS — G720 Drug-induced myopathy: Secondary | ICD-10-CM | POA: Diagnosis not present

## 2022-01-10 DIAGNOSIS — E7800 Pure hypercholesterolemia, unspecified: Secondary | ICD-10-CM

## 2022-01-10 DIAGNOSIS — I700 Atherosclerosis of aorta: Secondary | ICD-10-CM

## 2022-01-10 DIAGNOSIS — E669 Obesity, unspecified: Secondary | ICD-10-CM

## 2022-01-10 DIAGNOSIS — I2510 Atherosclerotic heart disease of native coronary artery without angina pectoris: Secondary | ICD-10-CM

## 2022-01-10 DIAGNOSIS — Z8669 Personal history of other diseases of the nervous system and sense organs: Secondary | ICD-10-CM

## 2022-01-10 DIAGNOSIS — R7303 Prediabetes: Secondary | ICD-10-CM

## 2022-01-10 NOTE — Progress Notes (Signed)
Cardiology Office Note:    Date:  01/10/2022   ID:  James Gates, DOB 1950/10/31, MRN [id number]  PCP:  [facility]   [facility] Providers Cardiologist:  Sanda Klein, MD     Referring MD: [facility]   Chief Complaint  Patient presents with   Coronary Artery Disease        History of Present Illness:    James Gates is a 71 y.o. male former smoker with a hx of OSA, hypercholesterolemia and borderline diabetes mellitus who has had recent problems with atypical chest discomfort.  He mostly receives care at the [facility] in [place].  His mother-in-law is also my patient.  Due to complaints of chest pain he underwent a coronary CT angiogram.  This shows that he has scattered mild-moderate nonobstructive CAD without hemodynamically meaningful stenoses.  The worst area was a 50-69% stenosis of the proximal LAD artery.  CT-FFR showed normal findings in all 3 coronary distributions.  On the other hand his calcium score is high, placing him in the 86th percentile for age and gender.  Also noted to have aortic atherosclerosis, but normal caliber aorta.  He is currently asymptomatic.The patient specifically denies any chest pain at rest exertion, dyspnea at rest or with exertion, orthopnea, paroxysmal nocturnal dyspnea, syncope, palpitations, focal neurological deficits, intermittent claudication, lower extremity edema, unexplained weight gain, cough, hypersomnolence, hemoptysis or wheezing.  He has a history of hypercholesterolemia but is intolerant to statins due to myalgia.  He is currently on ezetimibe.  I do not have his most recent lipid profile, performed a few months ago at the [facility].  He has borderline diabetes mellitus with a hemoglobin A1c of 6.0%.  He has normal renal function.  He has not smoked in over 20 years.  He has a history obstructive sleep apnea that improved after losing substantial weight.  He denies daytime hypersomnolence.  He  has a history of lumbar spine surgery and right total hip replacement.   Past Medical History:  Diagnosis Date   Arthritis    BPH (benign prostatic hypertrophy) with urinary retention    Colon polyps    GERD (gastroesophageal reflux disease)    History of BPH    Hyperlipidemia    Numbness and tingling of both legs    PONV (postoperative nausea and vomiting)    Pre-diabetes    PTSD (post-traumatic stress disorder)    [place]; being consulted at the [facility] in [place].    Sleep apnea    cpap   Sleep apnea, obstructive 2011   sleep study done @ [place]; does not wear CPAP at this moment   Spinal stenosis of lumbar region    Wears partial dentures     Past Surgical History:  Procedure Laterality Date   COLONOSCOPY     COLONOSCOPY W/ POLYPECTOMY     CYST EXCISION     on back   LUMBAR LAMINECTOMY/DECOMPRESSION MICRODISCECTOMY N/A 12/20/2015   Procedure: Lumbar two-Lumbar five Decompressive Laminectomy;  Surgeon: Jovita Gamma, MD;  Location: [facility];  Service: Neurosurgery;  Laterality: N/A;   LUMBAR LAMINECTOMY/DECOMPRESSION MICRODISCECTOMY Right 02/16/2018   Procedure: LUMBAR ONE-TWO LAMINOTOMY AND MICRODISCECTOMY;  Surgeon: Jovita Gamma, MD;  Location: [facility];  Service: Neurosurgery;  Laterality: Right;  LUMBAR ONE-TWO LAMINOTOMY AND MICRODISCECTOMY   POLYPECTOMY     TOTAL HIP ARTHROPLASTY Right 04/24/2015   Procedure: TOTAL HIP ARTHROPLASTY ANTERIOR APPROACH;  Surgeon: Frederik Pear, MD;  Location: [place];  Service: Orthopedics;  Laterality: Right;    Current Medications: Current Meds  Medication Sig   acetaminophen (TYLENOL) 500 MG tablet Take 1,000 mg by mouth every 6 (six) hours as needed for mild pain.   cholecalciferol (VITAMIN D3) 25 MCG (1000 UNIT) tablet Take 1,000 Units by mouth 3 (three) times a week.   ezetimibe (ZETIA) 10 MG tablet TAKE 1 TABLET BY MOUTH EVERY DAY NEEDS APPOINTMENT   ivabradine (CORLANOR) 7.5 MG TABS tablet Take '15mg'$  TWO hours prior to  your cardiac CT scan.   metoprolol tartrate (LOPRESSOR) 100 MG tablet Take '100mg'$  tablet TWO hours prior to your cardiac CT scan.   omeprazole (PRILOSEC) 20 MG capsule Take 20 mg by mouth daily.   TURMERIC PO Take 1 Scoop by mouth daily.     Allergies:   Patient has no known allergies.   Social History   Socioeconomic History   Marital status: Married    Spouse name: Not on file   Number of children: Not on file   Years of education: Not on file   Highest education level: Not on file  Occupational History   Not on file  Tobacco Use   Smoking status: Former    Packs/day: 1.00    Years: 15.00    Pack years: 15.00    Types: Cigarettes    Quit date: 08/26/1981    Years since quitting: 40.4    Passive exposure: Past   Smokeless tobacco: Never  Vaping Use   Vaping Use: Never used  Substance and Sexual Activity   Alcohol use: Yes    Alcohol/week: 6.0 standard drinks    Types: 6 Standard drinks or equivalent per week   Drug use: No   Sexual activity: Yes  Other Topics Concern   Not on file  Social History Narrative   Not on file   Social Determinants of Health   Financial Resource Strain: Not on file  Food Insecurity: Not on file  Transportation Needs: Not on file  Physical Activity: Not on file  Stress: Not on file  Social Connections: Not on file     Family History: The patient's family history includes Lung cancer in his father; Ulcers in an other family member. There is no history of Colon cancer.  ROS:   Please see the history of present illness.     All other systems reviewed and are negative.  EKGs/Labs/Other Studies Reviewed:    The following studies were reviewed today: CT abdomen 2020, notes from Dr. Claiborne Billings 2021, recent ED visit notes, labs, imaging studies  EKG:  EKG is not ordered today.  The ekg ordered 01 30 2023 demonstrates normal sinus rhythm, normal tracing  Recent Labs: 09/24/2021: ALT 30; BUN 17; Creatinine, Ser 1.18; Hemoglobin 15.8; Platelets  159; Potassium 4.4; Sodium 137  Recent Lipid Panel    Component Value Date/Time   CHOL 221 (H) 10/11/2013 1022   TRIG 103 08/02/2019 0511   HDL 55.10 10/11/2013 1022   CHOLHDL 4 10/11/2013 1022   VLDL 11.8 10/11/2013 1022   LDLDIRECT 157.6 10/11/2013 1022     Risk Assessment/Calculations:           Physical Exam:    VS:  BP (!) 142/74   Pulse 82   Ht '5\' 8"'$  (1.727 m)   Wt 210 lb 12.8 oz (95.6 kg)   SpO2 98%   BMI 32.05 kg/m     Wt Readings from Last 3 Encounters:  01/10/22 210 lb 12.8 oz (95.6 kg)  09/27/21 211 lb (  95.7 kg)  09/24/21 212 lb (96.2 kg)     General: Alert, oriented x3, no distress, mildly obese Head: no evidence of trauma, PERRL, EOMI, no exophtalmos or lid lag, no myxedema, no xanthelasma; normal ears, nose and oropharynx Neck: normal jugular venous pulsations and no hepatojugular reflux; brisk carotid pulses without delay and no carotid bruits Chest: clear to auscultation, no signs of consolidation by percussion or palpation, normal fremitus, symmetrical and full respiratory excursions Cardiovascular: normal position and quality of the apical impulse, regular rhythm, normal first and second heart sounds, no murmurs, rubs or gallops Abdomen: no tenderness or distention, no masses by palpation, no abnormal pulsatility or arterial bruits, normal bowel sounds, no hepatosplenomegaly Extremities: no clubbing, cyanosis or edema; 2+ radial, ulnar and brachial pulses bilaterally; 2+ right femoral, posterior tibial and dorsalis pedis pulses; 2+ left femoral, posterior tibial and dorsalis pedis pulses; no subclavian or femoral bruits Neurological: grossly nonfocal Psych: Normal mood and affect   ASSESSMENT:    1. Coronary artery disease involving native coronary artery of native heart without angina pectoris   2. Atherosclerosis of aorta ([place])   3. Hypercholesterolemia   4. Statin myopathy   5. Prediabetes   6. Mild obesity   7. History of obstructive sleep  apnea     PLAN:    In order of problems listed above:  CAD: He has mild-moderate heart atherosclerosis including a 50 to 69% proximal LAD artery stenosis.  Calcium score places him in the 86th percentile.  He had some chest pain that was likely not coronary in etiology and is currently asymptomatic.  The focus is on risk factor modification. Aortic  atherosclerosis: Incidentally seen on imaging studies, normal caliber aorta and no symptoms of PAD. HLP: Need to get his most recent lipid profile.  His target LDL is less than 70 based on the calcium score and moderate CAD.  I doubt that the ezetimibe that he is currently taking will bring him into this range.  Strongly recommend PCSK9 inhibitors due to his history of statin myopathy.  Tentatively plan to prescribe Repatha 140 mg daily, pending review of his [facility] labs and insurance approval. Prediabetes: Encouraged additional weight loss, avoiding carbohydrates since he has borderline diabetes. Hx of OSA: Currently asymptomatic and no longer snores after weight loss.  He has stopped using CPAP.  He denies daytime hypersomnolence.           Medication Adjustments/Labs and Tests Ordered: Current medicines are reviewed at length with the patient today.  Concerns regarding medicines are outlined above.  No orders of the defined types were placed in this encounter.  No orders of the defined types were placed in this encounter.   Patient Instructions  Medication Instructions:  Dr. Sallyanne Kuster recommends Repatha (PCSK9). This is an injectable cholesterol medication. This medication will need prior approval with your insurance company, which we will work on. If the medication is not approved initially, we may need to do an appeal with your insurance. We will keep you updated on this process. This medication can be provided at some local pharmacies or be shipped to you from a specialty pharmacy.   *If you need a refill on your cardiac medications before  your next appointment, please call your pharmacy*   Lab Work: Bring [place] a copy of your labs or fax to [phone]  If you have labs (blood work) drawn today and your tests are completely normal, you will receive your results only by: MyChart Message (if you have  MyChart) OR A paper copy in the mail If you have any lab test that is abnormal or we need to change your treatment, we will call you to review the results.   Testing/Procedures: None ordered   Follow-Up: At [facility], you and your health needs are our priority.  As part of our continuing mission to provide you with exceptional heart care, we have created designated Provider Care Teams.  These Care Teams include your primary Cardiologist (physician) and Advanced Practice Providers (APPs -  Physician Assistants and Nurse Practitioners) who all work together to provide you with the care you need, when you need it.  We recommend signing up for the patient portal called "MyChart".  Sign up information is provided on this After Visit Summary.  MyChart is used to connect with patients for Virtual Visits (Telemedicine).  Patients are able to view lab/test results, encounter notes, upcoming appointments, etc.  Non-urgent messages can be sent to your provider as well.   To learn more about what you can do with MyChart, go to [email or web address].    Your next appointment:   6 month(s)  The format for your next appointment:   In Person  Provider:   Sanda Klein, MD {       Signed, Sanda Klein, MD  01/10/2022 7:24 PM    [place]

## 2022-01-10 NOTE — Patient Instructions (Signed)
Medication Instructions:  Dr. Sallyanne Kuster recommends Repatha (PCSK9). This is an injectable cholesterol medication. This medication will need prior approval with your insurance company, which we will work on. If the medication is not approved initially, we may need to do an appeal with your insurance. We will keep you updated on this process. This medication can be provided at some local pharmacies or be shipped to you from a specialty pharmacy.   *If you need a refill on your cardiac medications before your next appointment, please call your pharmacy*   Lab Work: Bring [place] a copy of your labs or fax to [phone]  If you have labs (blood work) drawn today and your tests are completely normal, you will receive your results only by: MyChart Message (if you have MyChart) OR A paper copy in the mail If you have any lab test that is abnormal or we need to change your treatment, we will call you to review the results.   Testing/Procedures: None ordered   Follow-Up: At [facility], you and your health needs are our priority.  As part of our continuing mission to provide you with exceptional heart care, we have created designated Provider Care Teams.  These Care Teams include your primary Cardiologist (physician) and Advanced Practice Providers (APPs -  Physician Assistants and Nurse Practitioners) who all work together to provide you with the care you need, when you need it.  We recommend signing up for the patient portal called "MyChart".  Sign up information is provided on this After Visit Summary.  MyChart is used to connect with patients for Virtual Visits (Telemedicine).  Patients are able to view lab/test results, encounter notes, upcoming appointments, etc.  Non-urgent messages can be sent to your provider as well.   To learn more about what you can do with MyChart, go to [email or web address].    Your next appointment:   6 month(s)  The format for your next appointment:   In  Person  Provider:   Sanda Klein, MD {

## 2024-03-09 ENCOUNTER — Ambulatory Visit: Attending: Cardiology | Admitting: Nurse Practitioner

## 2024-03-09 ENCOUNTER — Encounter: Payer: Self-pay | Admitting: Nurse Practitioner

## 2024-03-09 VITALS — BP 126/78 | Ht 69.0 in | Wt 204.2 lb

## 2024-03-09 DIAGNOSIS — R42 Dizziness and giddiness: Secondary | ICD-10-CM

## 2024-03-09 DIAGNOSIS — R7303 Prediabetes: Secondary | ICD-10-CM | POA: Diagnosis not present

## 2024-03-09 DIAGNOSIS — G4733 Obstructive sleep apnea (adult) (pediatric): Secondary | ICD-10-CM

## 2024-03-09 DIAGNOSIS — I2510 Atherosclerotic heart disease of native coronary artery without angina pectoris: Secondary | ICD-10-CM | POA: Diagnosis not present

## 2024-03-09 DIAGNOSIS — E785 Hyperlipidemia, unspecified: Secondary | ICD-10-CM | POA: Diagnosis not present

## 2024-03-09 NOTE — Progress Notes (Unsigned)
 Office Visit    Patient Name: James Gates Date of Encounter: 03/09/2024  Primary Care Provider:  [facility] Primary Cardiologist:  Jerel Balding, MD  Chief Complaint    73 year old male with a history of nonobstructive CAD, aortic atherosclerosis, hyperlipidemia, prediabetes, OSA, GERD, BPH, and PTSD who presents for follow-up related to CAD.  Past Medical History    Past Medical History:  Diagnosis Date   Arthritis    BPH (benign prostatic hypertrophy) with urinary retention    Colon polyps    GERD (gastroesophageal reflux disease)    History of BPH    Hyperlipidemia    Numbness and tingling of both legs    PONV (postoperative nausea and vomiting)    Pre-diabetes    PTSD (post-traumatic stress disorder)    [facility]; being consulted at the [facility] in [place].    Sleep apnea    cpap   Sleep apnea, obstructive 2011   sleep study done @ [facility]; does not wear CPAP at this moment   Spinal stenosis of lumbar region    Wears partial dentures    Past Surgical History:  Procedure Laterality Date   COLONOSCOPY     COLONOSCOPY W/ POLYPECTOMY     CYST EXCISION     on back   LUMBAR LAMINECTOMY/DECOMPRESSION MICRODISCECTOMY N/A 12/20/2015   Procedure: Lumbar two-Lumbar five Decompressive Laminectomy;  Surgeon: Lamar Peaches, MD;  Location: [facility];  Service: Neurosurgery;  Laterality: N/A;   LUMBAR LAMINECTOMY/DECOMPRESSION MICRODISCECTOMY Right 02/16/2018   Procedure: LUMBAR ONE-TWO LAMINOTOMY AND MICRODISCECTOMY;  Surgeon: Peaches Lamar, MD;  Location: [facility];  Service: Neurosurgery;  Laterality: Right;  LUMBAR ONE-TWO LAMINOTOMY AND MICRODISCECTOMY   POLYPECTOMY     TOTAL HIP ARTHROPLASTY Right 04/24/2015   Procedure: TOTAL HIP ARTHROPLASTY ANTERIOR APPROACH;  Surgeon: Dempsey Sensor, MD;  Location: [facility] OR;  Service: Orthopedics;  Laterality: Right;    Allergies  No Known Allergies   Labs/Other Studies Reviewed    The following studies were  reviewed today:  Cardiac Studies & Procedures   ______________________________________________________________________________________________          CT SCANS  CT CORONARY FRACTIONAL FLOW RESERVE DATA PREP 12/03/2021  Narrative EXAM: CT FFR ANALYSIS  CLINICAL DATA:  Abnormal CCTA  FINDINGS: FFRct analysis was performed on the original cardiac CT angiogram dataset. Diagrammatic representation of the FFRct analysis is provided in a separate PDF document in PACS. This dictation was created using the PDF document and an interactive 3D model of the results. 3D model is not available in the EMR/PACS. Normal FFR range is >0.80.  1. Left Main:  No significant stenosis.  2. LAD: No significant stenosis.  FFRct 0.83 3. LCX: No significant stenosis.  FFRct 0.87 4. RCA: No significant stenosis.  FFRct 0.90  IMPRESSION: 1.  CT FFR analysis didn't show any significant stenosis.  .   Electronically Signed By: Redell Cave M.D. On: 12/04/2021 08:11   CT CORONARY MORPH W/CTA COR W/SCORE 12/03/2021  Addendum 12/03/2021  3:27 PM ADDENDUM REPORT: 12/03/2021 15:24  EXAM: OVER-READ INTERPRETATION  CT CHEST  The following report is an over-read performed by radiologist Dr. Selinda Saas [facility] on 12/03/2021. This over-read does not include interpretation of cardiac or coronary anatomy or pathology. The coronary CTA interpretation by the cardiologist is attached.  COMPARISON:  None.  FINDINGS: Cardiovascular: Normal heart size. No significant pericardial effusion/thickening. Atherosclerotic nonaneurysmal thoracic aorta. Normal caliber pulmonary arteries. No central pulmonary emboli.  Mediastinum/Nodes: Unremarkable esophagus. No pathologically enlarged  mediastinal or hilar lymph nodes.  Lungs/Pleura: No pneumothorax. No pleural effusion. No acute consolidative airspace disease, lung masses or significant pulmonary nodules.  Upper abdomen: No acute  abnormality.  Musculoskeletal: No aggressive appearing focal osseous lesions. Moderate thoracic spondylosis.  IMPRESSION: 1. No significant extracardiac findings. 2. Aortic Atherosclerosis (ICD10-I70.0).   Electronically Signed By: Selinda DELENA Blue M.D. On: 12/03/2021 15:24  Narrative CLINICAL DATA:  Chest pain  EXAM: Cardiac/Coronary  CTA  TECHNIQUE: The patient was scanned on a Siemens Somatom go.Top scanner.  : A retrospective scan was triggered in the descending thoracic aorta. Axial non-contrast 3 mm slices were carried out through the heart. The data set was analyzed on a dedicated work station and scored using the Agatson method. Gantry rotation speed was 330 msecs and collimation was .6 mm. 100mg  of metoprolol , 15mg  ivabradine  and 0.8 mg of sl NTG was given. The 3D data set was reconstructed in 5% intervals of the 60-95 % of the R-R cycle. Diastolic phases were analyzed on a dedicated work station using MPR, MIP and VRT modes. The patient received 100 cc of contrast.  FINDINGS: Aorta: Normal size. Mild aortic root calcifications. No dissection.  Aortic Valve:  Trileaflet.  No calcifications.  Coronary Arteries:  Normal coronary origin.  Right dominance.  RCA is a dominant artery that gives rise to PDA and PLA. There is minimal calcifications causing minimal proximal RCA stenosis (<25%).  Left main gives rise to LAD and LCX arteries.  LM has no disease.  LAD has proximal calcified plaque causing moderate stenosis (50-69%).  LCX is a non-dominant artery that gives rise to an OM1 branch. There is non calcified plaque in the proximal LCx causing moderate stenosis (50%).  Other findings:  Normal pulmonary vein drainage into the left atrium.  Normal left atrial appendage without a thrombus.  Normal size of the pulmonary artery.  IMPRESSION: 1. Coronary calcium  score of 528. This was 86th percentile for age and sex matched control.  2. Normal coronary  origin with right dominance.  3. Moderate stenosis int he proximal LAD and LCx (50%).  4. Minimal proximal RCA stenosis (<25%)  5. CAD-RADS 3. Moderate stenosis. Consider symptom-guided anti-ischemic pharmacotherapy as well as risk factor modification per guideline directed care.  6. Additional analysis with CT FFR will be submitted and reported separately.  Electronically Signed: By: Redell Cave M.D. On: 12/03/2021 13:34     ______________________________________________________________________________________________     Recent Labs: No results found for requested labs within last 365 days.  Recent Lipid Panel    Component Value Date/Time   CHOL 221 (H) 10/11/2013 1022   TRIG 103 08/02/2019 0511   HDL 55.10 10/11/2013 1022   CHOLHDL 4 10/11/2013 1022   VLDL 11.8 10/11/2013 1022   LDLDIRECT 157.6 10/11/2013 1022    History of Present Illness    73 year old male with the above past medical history including nonobstructive CAD, aortic atherosclerosis, hyperlipidemia, prediabetes, OSA, GERD, BPH, and PTSD.  He was referred to cardiology in the setting of atypical chest discomfort.  Coronary CTA in 11/2021 revealed coronary calcium  score of 528 (86 percentile), moderate stenosis in the proximal LAD and LCx, negative FFR.  Of note, he has a history of statin intolerance.  He was last seen in the office on 01/10/2022 and was stable from a cardiac standpoint.  He presents today for follow-up.  Since his last visit He has been stable from a cardiac standpoint.  He has noted a several month history of intermittent lightheadedness,  he will hear his heart beating in his ear.  He denies any significant palpitations, denies the.  Orthostatics normal with his PCP.  He does have a history of vertigo.  Denies any chest pain, dyspnea, palpitations.  He is active, he walks regularly, he mows his lawn.  BP and heart rate stable in office today.  Will check echocardiogram, will check  carotid ultrasound.  Follow-up in 2 to 3 months.  Reviewed ED precautions.  We discussed cardiac monitor, he declines today.  We discussed referral to lipid clinic Pharm.D. for consideration of PCSK9 inhibitor.  He declines today.  Can revisit at follow-up.  He also reports frequent episodes of anxiety nervousness.  Recommend follow-up with PCP. 1. CAD: Coronary CTA in 11/2021 revealed coronary calcium  score of 528 (86 percentile), moderate stenosis in the proximal LAD and LCx, negative FFR.   2. Hyperlipidemia: LDL was 126 in 11/2023.  Continue Zetia . 3. Prediabetes: A1c was 5.9 in 11/2023.  4. OSA: non adherent  5. Disposition: Follow-up in   Home Medications    Current Outpatient Medications  Medication Sig Dispense Refill   acetaminophen  (TYLENOL ) 500 MG tablet Take 1,000 mg by mouth every 6 (six) hours as needed for mild pain.     cholecalciferol (VITAMIN D3) 25 MCG (1000 UNIT) tablet Take 1,000 Units by mouth 3 (three) times a week.     ezetimibe  (ZETIA ) 10 MG tablet TAKE 1 TABLET BY MOUTH EVERY DAY NEEDS APPOINTMENT 90 tablet 3   Krill Oil 300 MG CAPS Take by mouth.     omeprazole (PRILOSEC) 20 MG capsule Take 20 mg by mouth daily.     TURMERIC PO Take 1 Scoop by mouth daily.     ivabradine  (CORLANOR) 7.5 MG TABS tablet Take 15mg  TWO hours prior to your cardiac CT scan. (Patient not taking: Reported on 03/09/2024) 2 tablet 0   metoprolol  tartrate (LOPRESSOR ) 100 MG tablet Take 100mg  tablet TWO hours prior to your cardiac CT scan. (Patient not taking: Reported on 03/09/2024) 1 tablet 0   No current facility-administered medications for this visit.     Review of Systems    ***.  All other systems reviewed and are otherwise negative except as noted above.    Physical Exam    VS:  BP 126/78 (BP Location: Left Arm, Patient Position: Sitting, Cuff Size: Normal)   Ht 5' 9 (1.753 m)   Wt 204 lb 3.2 oz (92.6 kg)   SpO2 97%   BMI 30.16 kg/m  , BMI Body mass index is 30.16 kg/m.      GEN: Well nourished, well developed, in no acute distress. HEENT: normal. Neck: Supple, no JVD, carotid bruits, or masses. Cardiac: RRR, no murmurs, rubs, or gallops. No clubbing, cyanosis, edema.  Radials/DP/PT 2+ and equal bilaterally.  Respiratory:  Respirations regular and unlabored, clear to auscultation bilaterally. GI: Soft, nontender, nondistended, BS + x 4. MS: no deformity or atrophy. Skin: warm and dry, no rash. Neuro:  Strength and sensation are intact. Psych: Normal affect.  Accessory Clinical Findings    ECG personally reviewed by me today - EKG Interpretation Date/Time:  Tuesday March 09 2024 11:23:38 EDT Ventricular Rate:  80 PR Interval:  158 QRS Duration:  78 QT Interval:  344 QTC Calculation: 396 R Axis:   63  Text Interpretation: Normal sinus rhythm Normal ECG When compared with ECG of 24-Sep-2021 09:18, No significant change since last tracing Confirmed by Daneen Perkins ([id number]) on 03/09/2024 11:48:40 AM  - no acute  changes.   Lab Results  Component Value Date   WBC 4.8 09/24/2021   HGB 15.8 09/24/2021   HCT 47.3 09/24/2021   MCV 88.1 09/24/2021   PLT 159 09/24/2021   Lab Results  Component Value Date   CREATININE 1.18 09/24/2021   BUN 17 09/24/2021   NA 137 09/24/2021   K 4.4 09/24/2021   CL 104 09/24/2021   CO2 26 09/24/2021   Lab Results  Component Value Date   ALT 30 09/24/2021   AST 25 09/24/2021   ALKPHOS 77 09/24/2021   BILITOT 0.8 09/24/2021   Lab Results  Component Value Date   CHOL 221 (H) 10/11/2013   HDL 55.10 10/11/2013   LDLDIRECT 157.6 10/11/2013   TRIG 103 08/02/2019   CHOLHDL 4 10/11/2013    Lab Results  Component Value Date   HGBA1C 6.0 (H) 03/12/2021    Assessment & Plan    1.  ***      Damien JAYSON Braver, NP 03/09/2024, 11:48 AM

## 2024-03-09 NOTE — Patient Instructions (Signed)
 Medication Instructions:  Your physician recommends that you continue on your current medications as directed. Please refer to the Current Medication list given to you today.  *If you need a refill on your cardiac medications before your next appointment, please call your pharmacy*  Lab Work: NONE ordered at this time of appointment   Testing/Procedures: Your physician has requested that you have a carotid duplex. This test is an ultrasound of the carotid arteries in your neck. It looks at blood flow through these arteries that supply the brain with blood. Allow one hour for this exam. There are no restrictions or special instructions.  Your physician has requested that you have an echocardiogram. Echocardiography is a painless test that uses sound waves to create images of your heart. It provides your doctor with information about the size and shape of your heart and how well your heart's chambers and valves are working. This procedure takes approximately one hour. There are no restrictions for this procedure. Please do NOT wear cologne, perfume, aftershave, or lotions (deodorant is allowed). Please arrive 15 minutes prior to your appointment time.  Please note: We ask at that you not bring children with you during ultrasound (echo/ vascular) testing. Due to room size and safety concerns, children are not allowed in the ultrasound rooms during exams. Our front office staff cannot provide observation of children in our lobby area while testing is being conducted. An adult accompanying a patient to their appointment will only be allowed in the ultrasound room at the discretion of the ultrasound technician under special circumstances. We apologize for any inconvenience.   Follow-Up: At [facility], you and your health needs are our priority.  As part of our continuing mission to provide you with exceptional heart care, our providers are all part of one team.  This team includes your primary  Cardiologist (physician) and Advanced Practice Providers or APPs (Physician Assistants and Nurse Practitioners) who all work together to provide you with the care you need, when you need it.  Your next appointment:   2-3 month(s)  Provider:   Jerel Balding, MD or Damien Braver, NP          We recommend signing up for the patient portal called MyChart.  Sign up information is provided on this After Visit Summary.  MyChart is used to connect with patients for Virtual Visits (Telemedicine).  Patients are able to view lab/test results, encounter notes, upcoming appointments, etc.  Non-urgent messages can be sent to your provider as well.   To learn more about what you can do with MyChart, go to [email or web address].   Other Instructions

## 2024-03-10 ENCOUNTER — Encounter: Payer: Self-pay | Admitting: Nurse Practitioner

## 2024-03-18 NOTE — Progress Notes (Signed)
 Spoke with pt. Pt is having fasting lab work prior to his next apt from the [facility]. Pt will bring results to his next f/u apt.

## 2024-04-16 ENCOUNTER — Other Ambulatory Visit (HOSPITAL_COMMUNITY)

## 2024-04-23 ENCOUNTER — Ambulatory Visit (HOSPITAL_COMMUNITY)
Admission: RE | Admit: 2024-04-23 | Discharge: 2024-04-23 | Disposition: A | Discharge status: Home / Self Care | Source: Ambulatory Visit | Attending: Nurse Practitioner | Admitting: Nurse Practitioner

## 2024-04-23 ENCOUNTER — Ambulatory Visit (HOSPITAL_BASED_OUTPATIENT_CLINIC_OR_DEPARTMENT_OTHER)
Admission: RE | Admit: 2024-04-23 | Discharge: 2024-04-23 | Disposition: A | Discharge status: Home / Self Care | Source: Ambulatory Visit | Attending: Nurse Practitioner | Admitting: Nurse Practitioner

## 2024-04-23 DIAGNOSIS — I2510 Atherosclerotic heart disease of native coronary artery without angina pectoris: Secondary | ICD-10-CM | POA: Insufficient documentation

## 2024-04-23 DIAGNOSIS — E785 Hyperlipidemia, unspecified: Secondary | ICD-10-CM

## 2024-04-23 DIAGNOSIS — R7303 Prediabetes: Secondary | ICD-10-CM | POA: Insufficient documentation

## 2024-04-23 DIAGNOSIS — R42 Dizziness and giddiness: Secondary | ICD-10-CM | POA: Diagnosis present

## 2024-04-23 DIAGNOSIS — G4733 Obstructive sleep apnea (adult) (pediatric): Secondary | ICD-10-CM | POA: Diagnosis present

## 2024-04-23 LAB — ECHOCARDIOGRAM COMPLETE
Area-P 1/2: 3.21 cm2
S' Lateral: 2.45 cm

## 2024-04-28 ENCOUNTER — Ambulatory Visit: Payer: Self-pay | Admitting: Nurse Practitioner

## 2024-04-29 ENCOUNTER — Other Ambulatory Visit (HOSPITAL_COMMUNITY): Payer: Self-pay | Admitting: Orthopedic Surgery

## 2024-04-29 DIAGNOSIS — M25551 Pain in right hip: Secondary | ICD-10-CM

## 2024-05-11 ENCOUNTER — Encounter (HOSPITAL_COMMUNITY)
Admission: RE | Admit: 2024-05-11 | Discharge: 2024-05-11 | Disposition: A | Discharge status: Home / Self Care | Source: Ambulatory Visit | Attending: Orthopedic Surgery | Admitting: Orthopedic Surgery

## 2024-05-11 ENCOUNTER — Ambulatory Visit (HOSPITAL_COMMUNITY)
Admission: RE | Admit: 2024-05-11 | Discharge: 2024-05-11 | Disposition: A | Discharge status: Home / Self Care | Source: Ambulatory Visit | Attending: Orthopedic Surgery | Admitting: Orthopedic Surgery

## 2024-05-11 DIAGNOSIS — M25551 Pain in right hip: Secondary | ICD-10-CM | POA: Diagnosis present

## 2024-05-11 MED ORDER — TECHNETIUM TC 99M MEDRONATE IV KIT
19.5000 | PACK | Freq: Once | INTRAVENOUS | Status: AC
  Administered 2024-05-11: 19.5 via INTRAVENOUS

## 2024-05-27 ENCOUNTER — Encounter: Payer: Self-pay | Admitting: Nurse Practitioner

## 2024-05-27 ENCOUNTER — Ambulatory Visit: Attending: Nurse Practitioner | Admitting: Nurse Practitioner

## 2024-05-27 VITALS — BP 100/64 | HR 92 | Ht 69.0 in | Wt 192.0 lb

## 2024-05-27 DIAGNOSIS — R7303 Prediabetes: Secondary | ICD-10-CM

## 2024-05-27 DIAGNOSIS — I6523 Occlusion and stenosis of bilateral carotid arteries: Secondary | ICD-10-CM | POA: Diagnosis not present

## 2024-05-27 DIAGNOSIS — G4733 Obstructive sleep apnea (adult) (pediatric): Secondary | ICD-10-CM

## 2024-05-27 DIAGNOSIS — I2510 Atherosclerotic heart disease of native coronary artery without angina pectoris: Secondary | ICD-10-CM

## 2024-05-27 DIAGNOSIS — E785 Hyperlipidemia, unspecified: Secondary | ICD-10-CM

## 2024-05-27 DIAGNOSIS — R42 Dizziness and giddiness: Secondary | ICD-10-CM

## 2024-05-27 NOTE — Progress Notes (Signed)
 Office Visit    Patient Name: James Gates Date of Encounter: 05/27/2024  Primary Care Provider:  [facility] Primary Cardiologist:  Jerel Balding, MD  Chief Complaint    73 year old male with a history of nonobstructive CAD, aortic atherosclerosis, carotid artery stenosis, hyperlipidemia, prediabetes, OSA, GERD, BPH, and PTSD who presents for follow-up related to CAD.   Past Medical History    Past Medical History:  Diagnosis Date   Arthritis    BPH (benign prostatic hypertrophy) with urinary retention    Colon polyps    GERD (gastroesophageal reflux disease)    History of BPH    Hyperlipidemia    Numbness and tingling of both legs    PONV (postoperative nausea and vomiting)    Pre-diabetes    PTSD (post-traumatic stress disorder)    [facility]; being consulted at the [facility] in [place].    Sleep apnea    cpap   Sleep apnea, obstructive 2011   sleep study done @ [facility]; does not wear CPAP at this moment   Spinal stenosis of lumbar region    Wears partial dentures    Past Surgical History:  Procedure Laterality Date   COLONOSCOPY     COLONOSCOPY W/ POLYPECTOMY     CYST EXCISION     on back   LUMBAR LAMINECTOMY/DECOMPRESSION MICRODISCECTOMY N/A 12/20/2015   Procedure: Lumbar two-Lumbar five Decompressive Laminectomy;  Surgeon: Lamar Peaches, MD;  Location: [facility];  Service: Neurosurgery;  Laterality: N/A;   LUMBAR LAMINECTOMY/DECOMPRESSION MICRODISCECTOMY Right 02/16/2018   Procedure: LUMBAR ONE-TWO LAMINOTOMY AND MICRODISCECTOMY;  Surgeon: Peaches Lamar, MD;  Location: [facility] OR;  Service: Neurosurgery;  Laterality: Right;  LUMBAR ONE-TWO LAMINOTOMY AND MICRODISCECTOMY   POLYPECTOMY     TOTAL HIP ARTHROPLASTY Right 04/24/2015   Procedure: TOTAL HIP ARTHROPLASTY ANTERIOR APPROACH;  Surgeon: Dempsey Sensor, MD;  Location: [facility] OR;  Service: Orthopedics;  Laterality: Right;    Allergies  No Known Allergies   Labs/Other Studies Reviewed    The  following studies were reviewed today:  Cardiac Studies & Procedures   ______________________________________________________________________________________________     ECHOCARDIOGRAM  ECHOCARDIOGRAM COMPLETE 04/23/2024  Narrative ECHOCARDIOGRAM REPORT    Patient Name:   ABANOUB HANKEN Date of Exam: 04/23/2024 Medical Rec #:  [id number]       Height:       69.0 in Accession #:    [id number]      Weight:       204.2 lb Date of Birth:  1951-01-24        BSA:          2.084 m Patient Age:    73 years        BP:           104/57 mmHg Patient Gender: M               HR:           71 bpm. Exam Location:  [address]  Procedure: 2D Echo, Cardiac Doppler and Color Doppler (Both Spectral and Color Flow Doppler were utilized during procedure).  Indications:    Coronary artery disease involving native coronary artery of native heart without angina pectoris [I25.10 (ICD-10-CM)]; Dizziness [R42 (ICD-10-CM)]  History:        Patient has no prior history of Echocardiogram examinations. Risk Factors:Dyslipidemia and Former Smoker.  Sonographer:    Orvil Holmes RDCS Referring Phys: [phone] Aloysius Heinle C Karmen Altamirano  IMPRESSIONS   1. Left ventricular ejection fraction, by estimation, is  55 to 60%. The left ventricle has normal function. The left ventricle has no regional wall motion abnormalities. There is mild concentric left ventricular hypertrophy. Left ventricular diastolic parameters are indeterminate. 2. Right ventricular systolic function is normal. The right ventricular size is normal. 3. The mitral valve is normal in structure. Trivial mitral valve regurgitation. No evidence of mitral stenosis. 4. The aortic valve is tricuspid. Aortic valve regurgitation is not visualized. No aortic stenosis is present. 5. The inferior vena cava is normal in size with greater than 50% respiratory variability, suggesting right atrial pressure of 3 mmHg.  FINDINGS Left Ventricle: Left ventricular ejection  fraction, by estimation, is 55 to 60%. The left ventricle has normal function. The left ventricle has no regional wall motion abnormalities. Global longitudinal strain performed but not reported based on interpreter judgement due to suboptimal tracking. 3D ejection fraction reviewed and evaluated as part of the interpretation. Alternate measurement of EF is felt to be most reflective of LV function. The left ventricular internal cavity size was normal in size. There is mild concentric left ventricular hypertrophy. Left ventricular diastolic parameters are indeterminate. Indeterminate filling pressures.  Right Ventricle: The right ventricular size is normal. No increase in right ventricular wall thickness. Right ventricular systolic function is normal.  Left Atrium: Left atrial size was normal in size.  Right Atrium: Right atrial size was normal in size.  Pericardium: There is no evidence of pericardial effusion.  Mitral Valve: The mitral valve is normal in structure. Mild mitral annular calcification. Trivial mitral valve regurgitation. No evidence of mitral valve stenosis.  Tricuspid Valve: The tricuspid valve is normal in structure. Tricuspid valve regurgitation is not demonstrated. No evidence of tricuspid stenosis.  Aortic Valve: The aortic valve is tricuspid. Aortic valve regurgitation is not visualized. No aortic stenosis is present.  Pulmonic Valve: The pulmonic valve was normal in structure. Pulmonic valve regurgitation is not visualized. No evidence of pulmonic stenosis.  Aorta: The aortic root is normal in size and structure.  Venous: The inferior vena cava is normal in size with greater than 50% respiratory variability, suggesting right atrial pressure of 3 mmHg.  IAS/Shunts: No atrial level shunt detected by color flow Doppler.   LEFT VENTRICLE PLAX 2D LVIDd:         4.03 cm   Diastology LVIDs:         2.45 cm   LV e' medial:    7.40 cm/s LV PW:         1.16 cm   LV E/e'  medial:  10.1 LV IVS:        1.03 cm   LV e' lateral:   8.16 cm/s LVOT diam:     2.12 cm   LV E/e' lateral: 9.2 LV SV:         56 LV SV Index:   27        2D Longitudinal Strain LVOT Area:     3.53 cm  2D Strain GLS (A4C):   -13.3 % 2D Strain GLS (A3C):   -12.8 % 2D Strain GLS (A2C):   -7.6 % 2D Strain GLS Avg:     -11.2 %  3D Volume EF: 3D EF:        45 % LV EDV:       121 ml LV ESV:       67 ml LV SV:        54 ml  RIGHT VENTRICLE RV S prime:     10.60 cm/s TAPSE (  M-mode): 1.8 cm  LEFT ATRIUM         Index LA diam:    3.45 cm 1.66 cm/m AORTIC VALVE LVOT Vmax:   82.80 cm/s LVOT Vmean:  53.500 cm/s LVOT VTI:    0.159 m  AORTA Ao Root diam: 3.30 cm Ao Asc diam:  2.91 cm  MITRAL VALVE MV Area (PHT): 3.21 cm    SHUNTS MV Decel Time: 236 msec    Systemic VTI:  0.16 m MV E velocity: 74.90 cm/s  Systemic Diam: 2.12 cm MV A velocity: 55.50 cm/s MV E/A ratio:  1.35  Annabella Scarce MD Electronically signed by Annabella Scarce MD Signature Date/Time: 04/23/2024/8:59:08 AM    Final      CT SCANS  CT CORONARY FRACTIONAL FLOW RESERVE DATA PREP 12/03/2021  Narrative EXAM: CT FFR ANALYSIS  CLINICAL DATA:  Abnormal CCTA  FINDINGS: FFRct analysis was performed on the original cardiac CT angiogram dataset. Diagrammatic representation of the FFRct analysis is provided in a separate PDF document in PACS. This dictation was created using the PDF document and an interactive 3D model of the results. 3D model is not available in the EMR/PACS. Normal FFR range is >0.80.  1. Left Main:  No significant stenosis.  2. LAD: No significant stenosis.  FFRct 0.83 3. LCX: No significant stenosis.  FFRct 0.87 4. RCA: No significant stenosis.  FFRct 0.90  IMPRESSION: 1.  CT FFR analysis didn't show any significant stenosis.  .   Electronically Signed By: Redell Cave M.D. On: 12/04/2021 08:11   CT CORONARY MORPH W/CTA COR W/SCORE 12/03/2021  Addendum 12/03/2021   3:27 PM ADDENDUM REPORT: 12/03/2021 15:24  EXAM: OVER-READ INTERPRETATION  CT CHEST  The following report is an over-read performed by radiologist Dr. Selinda Saas [facility] on 12/03/2021. This over-read does not include interpretation of cardiac or coronary anatomy or pathology. The coronary CTA interpretation by the cardiologist is attached.  COMPARISON:  None.  FINDINGS: Cardiovascular: Normal heart size. No significant pericardial effusion/thickening. Atherosclerotic nonaneurysmal thoracic aorta. Normal caliber pulmonary arteries. No central pulmonary emboli.  Mediastinum/Nodes: Unremarkable esophagus. No pathologically enlarged mediastinal or hilar lymph nodes.  Lungs/Pleura: No pneumothorax. No pleural effusion. No acute consolidative airspace disease, lung masses or significant pulmonary nodules.  Upper abdomen: No acute abnormality.  Musculoskeletal: No aggressive appearing focal osseous lesions. Moderate thoracic spondylosis.  IMPRESSION: 1. No significant extracardiac findings. 2. Aortic Atherosclerosis (ICD10-I70.0).   Electronically Signed By: Selinda DELENA Blue M.D. On: 12/03/2021 15:24  Narrative CLINICAL DATA:  Chest pain  EXAM: Cardiac/Coronary  CTA  TECHNIQUE: The patient was scanned on a Siemens Somatom go.Top scanner.  : A retrospective scan was triggered in the descending thoracic aorta. Axial non-contrast 3 mm slices were carried out through the heart. The data set was analyzed on a dedicated work station and scored using the Agatson method. Gantry rotation speed was 330 msecs and collimation was .6 mm. 100mg  of metoprolol , 15mg  ivabradine  and 0.8 mg of sl NTG was given. The 3D data set was reconstructed in 5% intervals of the 60-95 % of the R-R cycle. Diastolic phases were analyzed on a dedicated work station using MPR, MIP and VRT modes. The patient received 100 cc of contrast.  FINDINGS: Aorta: Normal size. Mild aortic root  calcifications. No dissection.  Aortic Valve:  Trileaflet.  No calcifications.  Coronary Arteries:  Normal coronary origin.  Right dominance.  RCA is a dominant artery that gives rise to PDA and PLA. There is minimal calcifications  causing minimal proximal RCA stenosis (<25%).  Left main gives rise to LAD and LCX arteries.  LM has no disease.  LAD has proximal calcified plaque causing moderate stenosis (50-69%).  LCX is a non-dominant artery that gives rise to an OM1 branch. There is non calcified plaque in the proximal LCx causing moderate stenosis (50%).  Other findings:  Normal pulmonary vein drainage into the left atrium.  Normal left atrial appendage without a thrombus.  Normal size of the pulmonary artery.  IMPRESSION: 1. Coronary calcium  score of 528. This was 86th percentile for age and sex matched control.  2. Normal coronary origin with right dominance.  3. Moderate stenosis int he proximal LAD and LCx (50%).  4. Minimal proximal RCA stenosis (<25%)  5. CAD-RADS 3. Moderate stenosis. Consider symptom-guided anti-ischemic pharmacotherapy as well as risk factor modification per guideline directed care.  6. Additional analysis with CT FFR will be submitted and reported separately.  Electronically Signed: By: Redell Cave M.D. On: 12/03/2021 13:34     ______________________________________________________________________________________________     Recent Labs: No results found for requested labs within last 365 days.  Recent Lipid Panel    Component Value Date/Time   CHOL 221 (H) 10/11/2013 1022   TRIG 103 08/02/2019 0511   HDL 55.10 10/11/2013 1022   CHOLHDL 4 10/11/2013 1022   VLDL 11.8 10/11/2013 1022   LDLDIRECT 157.6 10/11/2013 1022    History of Present Illness    73 year old male with the above past medical history including nonobstructive CAD, aortic atherosclerosis, carotid artery stenosis, hyperlipidemia, prediabetes, OSA, GERD,  BPH, and PTSD.   He was referred to cardiology in the setting of atypical chest discomfort. Coronary CTA in 11/2021 revealed coronary calcium  score of 528 (86 percentile), moderate stenosis in the proximal LAD and LCx, negative FFR.  Of note, he has a history of statin intolerance.  He was last seen in the office on 03/09/2024 and reported a several month history of intermittent lightheadedness, he denies any palpitations, presyncope or syncope.  Carotid ultrasound in 03/2024 revealed 1 to 39% B ICA stenosis. Echocardiogram in 03/2024 showed EF 55 to 60%, normal LV function, no RWMA, mild concentric LVH, normal LV systolic function, no significant valvular abnormalities.   He presents today for follow-up.  Since his last visit he has done well from a cardiac standpoint.  He denies any further dizziness.  He believes that his prior symptoms of lightheadedness/dizziness were related to vertigo.  Symptoms have since resolved.  He denies any chest pain, palpitations, dizziness, dyspnea, edema, PND, orthopnea or weight gain.  He has been dealing with chronic back and hip pain, he is planning on resuming physical therapy soon.   He is going forward to attending [employer] [employer] [facility] next weekend.  Overall, he reports feeling well.   Home Medications    Current Outpatient Medications  Medication Sig Dispense Refill   acetaminophen  (TYLENOL ) 500 MG tablet Take 1,000 mg by mouth every 6 (six) hours as needed for mild pain.     cholecalciferol (VITAMIN D3) 25 MCG (1000 UNIT) tablet Take 1,000 Units by mouth 3 (three) times a week.     ezetimibe  (ZETIA ) 10 MG tablet TAKE 1 TABLET BY MOUTH EVERY DAY NEEDS APPOINTMENT 90 tablet 3   Krill Oil 300 MG CAPS Take by mouth.     omeprazole (PRILOSEC) 20 MG capsule Take 20 mg by mouth daily.     TURMERIC PO Take 1 Scoop by mouth daily.     ivabradine  (  CORLANOR) 7.5 MG TABS tablet Take 15mg  TWO hours prior to your cardiac CT scan. (Patient not taking: Reported on 05/27/2024) 2  tablet 0   metoprolol  tartrate (LOPRESSOR ) 100 MG tablet Take 100mg  tablet TWO hours prior to your cardiac CT scan. (Patient not taking: Reported on 05/27/2024) 1 tablet 0   No current facility-administered medications for this visit.     Review of Systems    He denies chest pain, palpitations, dyspnea, pnd, orthopnea, n, v, dizziness, syncope, edema, weight gain, or early satiety. All other systems reviewed and are otherwise negative except as noted above.   Physical Exam    VS:  BP 100/64 (BP Location: Left Arm, Patient Position: Sitting, Cuff Size: Large)   Pulse 92   Ht 5' 9 (1.753 m)   Wt 192 lb (87.1 kg)   SpO2 92%   BMI 28.35 kg/m  GEN: Well nourished, well developed, in no acute distress. HEENT: normal. Neck: Supple, no JVD, carotid bruits, or masses. Cardiac: RRR, no murmurs, rubs, or gallops. No clubbing, cyanosis, edema.  Radials/DP/PT 2+ and equal bilaterally.  Respiratory:  Respirations regular and unlabored, clear to auscultation bilaterally. GI: Soft, nontender, nondistended, BS + x 4. MS: no deformity or atrophy. Skin: warm and dry, no rash. Neuro:  Strength and sensation are intact. Psych: Normal affect.  Accessory Clinical Findings    ECG personally reviewed by me today -    - no EKG in office today.    Lab Results  Component Value Date   WBC 4.8 09/24/2021   HGB 15.8 09/24/2021   HCT 47.3 09/24/2021   MCV 88.1 09/24/2021   PLT 159 09/24/2021   Lab Results  Component Value Date   CREATININE 1.18 09/24/2021   BUN 17 09/24/2021   NA 137 09/24/2021   K 4.4 09/24/2021   CL 104 09/24/2021   CO2 26 09/24/2021   Lab Results  Component Value Date   ALT 30 09/24/2021   AST 25 09/24/2021   ALKPHOS 77 09/24/2021   BILITOT 0.8 09/24/2021   Lab Results  Component Value Date   CHOL 221 (H) 10/11/2013   HDL 55.10 10/11/2013   LDLDIRECT 157.6 10/11/2013   TRIG 103 08/02/2019   CHOLHDL 4 10/11/2013    Lab Results  Component Value Date   HGBA1C 6.0  (H) 03/12/2021    Assessment & Plan    1.  Lightheadedness/dizziness: At his last visit he reported a several month history of intermittent lightheadedness/dizziness.  Carotid ultrasound in 03/2024 revealed 1 to 39% B ICA stenosis. Echocardiogram in 03/2024 showed EF 55 to 60%, normal LV function, no RWMA, mild concentric LVH, normal LV systolic function, no significant valvular abnormalities. He declined cardiac monitor.  BP borderline low, overall stable.  He denies any palpitations, presyncope or syncope.  His symptoms have since resolved.  He believes his symptoms were related to vertigo.  Workup to date reassuring.  Continue to monitor. Reviewed ED precautions.    2. CAD: Coronary CTA in 11/2021 revealed coronary calcium  score of 528 (86 percentile), moderate stenosis in the proximal LAD and LCx, negative FFR. Stable with no anginal symptoms. No indication for ischemic evaluation.  Continue Zetia .   3.  Carotid artery stenosis: Carotid ultrasound in 03/2024 revealed 1 to 39% B ICA stenosis.  Asymptomatic. Continue Zetia .  4. Hyperlipidemia: LDL was 126 in 11/2023, above goal. He has a history of statin intolerance, Repatha was previously recommended, however he declined.  He again declines Pharm.D. referral today. Pending  repeat labs per PCP at the [facility]. Continue Zetia .   5. Prediabetes: A1c was 5.9 in 11/2023.  Monitored and managed per PCP.   6. OSA: Intermittent adherence to CPAP.  Managed per PCP.   7. Disposition:  Follow-up in 1 year, sooner if needed.       Damien JAYSON Braver, NP 05/27/2024, 10:26 AM

## 2024-05-27 NOTE — Patient Instructions (Signed)
 Medication Instructions:  Your physician recommends that you continue on your current medications as directed. Please refer to the Current Medication list given to you today.  *If you need a refill on your cardiac medications before your next appointment, please call your pharmacy*  Lab Work: NONE ordered at this time of appointment   Testing/Procedures: NONE ordered at this time of appointment    Follow-Up: At [facility], you and your health needs are our priority.  As part of our continuing mission to provide you with exceptional heart care, our providers are all part of one team.  This team includes your primary Cardiologist (physician) and Advanced Practice Providers or APPs (Physician Assistants and Nurse Practitioners) who all work together to provide you with the care you need, when you need it.  Your next appointment:   1 year(s)  Provider:   Luana Rumple, MD    We recommend signing up for the patient portal called "MyChart".  Sign up information is provided on this After Visit Summary.  MyChart is used to connect with patients for Virtual Visits (Telemedicine).  Patients are able to view lab/test results, encounter notes, upcoming appointments, etc.  Non-urgent messages can be sent to your provider as well.   To learn more about what you can do with MyChart, go to [email or web address].

## 2024-05-31 ENCOUNTER — Encounter: Payer: Self-pay | Admitting: Nurse Practitioner
